# Patient Record
Sex: Male | Born: 1945 | Race: White | Hispanic: No | Marital: Married | Smoking: Former smoker
Health system: Southern US, Community
[De-identification: ages and names within clinical notes are randomized; demographics above are authoritative.]

## PROBLEM LIST (undated history)

## (undated) DIAGNOSIS — I2510 Atherosclerotic heart disease of native coronary artery without angina pectoris: Secondary | ICD-10-CM

## (undated) DIAGNOSIS — K219 Gastro-esophageal reflux disease without esophagitis: Secondary | ICD-10-CM

## (undated) DIAGNOSIS — Z9861 Coronary angioplasty status: Secondary | ICD-10-CM

## (undated) DIAGNOSIS — I219 Acute myocardial infarction, unspecified: Secondary | ICD-10-CM

## (undated) DIAGNOSIS — E041 Nontoxic single thyroid nodule: Secondary | ICD-10-CM

## (undated) DIAGNOSIS — I639 Cerebral infarction, unspecified: Secondary | ICD-10-CM

## (undated) DIAGNOSIS — E785 Hyperlipidemia, unspecified: Secondary | ICD-10-CM

## (undated) DIAGNOSIS — I209 Angina pectoris, unspecified: Secondary | ICD-10-CM

## (undated) DIAGNOSIS — I10 Essential (primary) hypertension: Secondary | ICD-10-CM

## (undated) DIAGNOSIS — C4442 Squamous cell carcinoma of skin of scalp and neck: Secondary | ICD-10-CM

## (undated) DIAGNOSIS — E039 Hypothyroidism, unspecified: Secondary | ICD-10-CM

## (undated) HISTORY — PX: CORONARY ANGIOPLASTY WITH STENT PLACEMENT: SHX49

## (undated) HISTORY — PX: WISDOM TOOTH EXTRACTION: SHX21

## (undated) HISTORY — DX: Atherosclerotic heart disease of native coronary artery without angina pectoris: I25.10

## (undated) HISTORY — PX: SQUAMOUS CELL CARCINOMA EXCISION: SHX2433

## (undated) HISTORY — DX: Cerebral infarction, unspecified: I63.9

## (undated) HISTORY — DX: Atherosclerotic heart disease of native coronary artery without angina pectoris: Z98.61

---

## 2000-06-06 DIAGNOSIS — I219 Acute myocardial infarction, unspecified: Secondary | ICD-10-CM

## 2000-06-06 HISTORY — DX: Acute myocardial infarction, unspecified: I21.9

## 2001-03-19 ENCOUNTER — Encounter: Payer: Self-pay | Admitting: Emergency Medicine

## 2001-03-19 ENCOUNTER — Inpatient Hospital Stay (HOSPITAL_COMMUNITY): Admission: EM | Admit: 2001-03-19 | Discharge: 2001-03-21 | Payer: Self-pay | Admitting: Emergency Medicine

## 2008-08-04 ENCOUNTER — Encounter (HOSPITAL_COMMUNITY): Admission: RE | Admit: 2008-08-04 | Discharge: 2008-10-07 | Payer: Self-pay | Admitting: Endocrinology

## 2008-08-11 ENCOUNTER — Ambulatory Visit (HOSPITAL_COMMUNITY): Admission: RE | Admit: 2008-08-11 | Discharge: 2008-08-11 | Payer: Self-pay | Admitting: Endocrinology

## 2010-10-22 NOTE — H&P (Signed)
[place]. [facility]  Patient:    Dennis [name], Dennis [name] Visit Number: [id number] MRN: [id number]          Service Type: MED Location: [phone] Attending Physician:  Dennis [name] Dictated by:   Dennis [name], M.D. Admit Date:  03/19/2001   CC:         Dennis [name], M.D.   History and Physical  CHIEF COMPLAINT:  Chest pain.  HISTORY OF PRESENT ILLNESS:  This 65 year old male presents to the emergency room with a chief complaint of chest pain.  Today is Monday.  He says that on Friday night, he was awaken about 2 a.m. with anterior chest discomfort.  He describes this as rather light and indicates it with his fingertips.  He says there was no radiation, but the discomfort lasted for about an hour, although he took Tums.  There was associated diaphoresis and nausea.  On Saturday and Sunday, he was up and around walking, but not engaged in any strenuous activity and had no symptoms.  He had a similar episode about 6 a.m. this morning.  PAST MEDICAL HISTORY:  Unremarkable.  He has never been hospitalized and he has never been treated for any ongoing problems.  He does smoke about one half pack of cigarettes a day.  FAMILY HISTORY:  His mother is 73 and is institutionalized with Alzheimers disease.  She has emphysema.  She quit smoking about 15 years ago.  His father died at 19 of head and neck cancer.  One brother has a hiatal hernia and has had hepatitis and is otherwise well.  Both of his sisters are well, except that they both have hiatal hernias.  SOCIAL HISTORY:  He works as a Social research officer, government for businesses.  He has been married for some years.  He has no children.  He lives at home with his wife.  REVIEW OF SYSTEMS:  Constitutional:  He denies fevers, chills, or sweats, except as noted above.  He has had no claudication or edema.  Eyes:  No diplopia or blurring.  ENT:  No deafness or dizziness.  Cardiovascular:  See the history of present  illness.  Respiratory:  His wife says that he occasionally wheezes.  GI:  He has some heartburn.  GU:  No dysuria or pyuria. Musculoskeletal:  No swollen joints or myalgias.  Skin:  No rash or nodule. Neurologic:  No faintness or syncope.  Psychiatric:  No depression or hallucinations.  Endocrine:  No known diabetes or thyroid disease. Hematology/Lymphatics:  No swelling in the neck, axillae, or groin.  Allergic: No known drug allergies.  All of the remaining systems in the comprehensive 14-system review is negative.  PHYSICAL EXAMINATION:  VITAL SIGNS:  Blood pressure 100/60, pulse 84 and regular, respirations 18 and unlabored, temperature 97.1 degrees.  GENERAL APPEARANCE:  He is a well-developed, well-nourished, middle-aged man in no acute distress.  He is oriented to person, place, and time.  His mood and affect are appropriate.  HEENT:  His conjunctivae and lids reveal no xanthelasma, icterus, or arcus senilis.  He has many of his own teeth, which are in fair repair.  The oral mucosa reveals no pallor or cyanosis.  NECK:  Supple and symmetrical.  The trachea is midline and mobile.  There is no palpable thyromegaly or cervical nodes.  No carotid bruit or JVD.  LUNGS:  His respiratory effort is normal at rest.  His lungs are clear to auscultation and percussion, except for possibly  slightly prolonged forced expiratory time.  BACK:  Straight and nontender.  There was no kyphosis, scoliosis, or punch tenderness.  NEUROLOGIC:  His gait is not tested.  He looks as though he would be able to do an adequate stress test.  Muscle strength and tone are normal.  HEART:  The cardiac apical impulse is cryptic.  The left border of cardiac dullness is within the left anterior axillary line.  The heart rhythm is regular and rate is normal.  The sounds are quite distant.  There is no gallop, click, or murmur.  His digits and nails revealed no clubbing or cyanosis.  SKIN:  The skin and  subcutaneous tissues reveal no stasis dermatitis or ulcer.  ABDOMEN:  Flat and nontender.  Bowel sounds are present.  His liver and spleen are not palpably enlarged.  The abdominal aorta is not palpable and there is no bruit.  EXTREMITIES:  The femoral arteries are without bruits.  The pedal pulses are intact dorsalis pedis and posterior tibial bilaterally.  The legs reveal no edema.  LABORATORY DATA:  His initial EKG is within normal limits, except for minor nonspecific ST segment flattening.  The chest x-ray is normal.  His first enzymes have just now returned and they are normal with a troponin I of 1.6.  At this point, Dennis [name] has almost a 100% chance of having significant coronary obstruction.  He was referred for cardiac catheterization today. Dictated by:   Dennis [name], M.D. Attending Physician:  Dennis [name] DD:  03/19/01 TD:  03/19/01 Job: [id number]

## 2010-10-22 NOTE — Cardiovascular Report (Signed)
[place]. [facility]  Patient:    Dennis [name], Dennis [name] Visit Number: [id number] MRN: [id number]          Service Type: MED Location: [phone] Attending Physician:  Clovis Cao Dictated by:   Aram Candela. Aleen Campi, M.D. Proc. Date: 03/19/01 Admit Date:  03/19/2001   CC:         Jaclyn Prime. Lucas Mallow, M.D.  Cardiac Catheterization Lab   Cardiac Catheterization  REFERRING PHYSICIAN:  Jaclyn Prime. Lucas Mallow, M.D.  PROCEDURE DONE BY:  Aram Candela. Aleen Campi, M.D.  PROCEDURES: 1. Emergency left heart catheterization. 2. Coronary cineangiography. 3. Left ventricular cineangiography. 4. Emergency percutaneous transluminal coronary angioplasty and stent    placement in the proximal right coronary artery. 5. Perclose of the right femoral artery and right femoral vein.  INDICATION FOR PROCEDURES:  This 65 year old male presented to the emergency room this morning after severe anterior chest pain and his electrocardiogram showed ST elevation in his inferior leads.  His cardiac enzymes were also abnormal.  His chest pain was relieved in the emergency room just prior to transport to the cardiac catheterization lab.  He has a history of hypertension.  DESCRIPTION OF PROCEDURE:  After signing an informed consent, the patient was premedicated with 50 mg of Benadryl and transported from the emergency room at [facility] to the cardiac catheterization lab.  His right groin was prepped and draped in a sterile fashion and anesthetized locally with 1% lidocaine.  A 6-French introducer sheath was inserted percutaneously in the right femoral artery.  The 6-French #4 Judkins coronary catheters were used to make injections into the coronary arteries.  A 6-French pigtail catheter was used to measure pressures in the left ventricle and aorta and to make a midstream injection into the left ventricle.  After noting an almost total occlusion of the proximal right coronary artery  with 99% stenosis and slow antegrade flow of TIMI-2, we discussed this finding with the patient in addition to the finding of a severe stenosis of 80% in his mid circumflex coronary artery.  We also noted a large clot involved with the proximal right coronary artery lesion and anticipated possible need for Angiojet during the procedure.  After discussing these alternatives, we elected to proceed with emergency angioplasty and exchanged the 6-French in the right femoral artery for a 7-French sheath.  We then inserted a 6-French introducer sheath percutaneously into the right femoral vein because of the possibility of needing a temporary pacemaker wire if the Angiojet procedure was to be done. We then inserted a 7-French JR4 guide catheter through the right femoral artery sheath and advanced it to the root of the aorta.  After inserting a long Hi-Torque Floppy guide wire into the guide catheter, the tip of the catheter was engaged in the ostium of the right coronary artery and the guide wire was then very carefully advanced into the proximal right coronary artery. After very careful advancement through the lesion, the guide wire was then advanced into the distal segment of the right coronary artery.  Further injection into the right coronary artery showed total occlusion of the artery with the clot still in place and evidence that the right coronary lesion was occluded with just the guide wire in place.  We then requested and obtained rapidly a 2.5- x 20-mm balloon catheter which was advanced over the guide wire and positioned within the lesion.  Two inflations were made, the first at 10 atmospheres for 28 seconds, and the  second at 10 atmospheres for 23 seconds. After these two inflations within the lesion, the CrossSail balloon catheter was removed and injections again into the right coronary artery showed opening of the artery with the clot showing compression and reestablishment of  TIMI-3 antegrade flow.  The patient experienced mild anterior chest pressure while the artery was closed with the guide wire.  With evidence of good compression and stability of the clot, we elected not to proceed with an Angiojet procedure and selected a stent deployment system using an Express 2 stent, size 3.0- x 24-mm, and after proper preparation, this stent deployment system was advanced over the guide wire and positioned within the lesion.  The stent was then deployed with one inflation at 14 atmospheres for 49 seconds.  After the stent was deployed, the deployment balloon was removed and final injections in the right coronary artery showed an excellent angiographic result with 0% residual lesion and no evidence for dissection or clot.  There was excellent runoff distally without any evidence for distal embolization. The patient tolerated the procedure well and no complications were noted.  At the end of the procedure, the catheter and sheath were removed from the right femoral artery and hemostasis was easily obtained with a Perclose closure system.  The right femoral vein sheath was then also removed and hemostasis was obtained with the Perclose closure system.  MEDICATIONS GIVEN:  Versed 1 mg IV, heparin 7000 units IV, Integrilin drip per pharmacy protocol.  HEMODYNAMIC DATA:  Left ventricular pressure 108/0-12.  Aortic pressure 108/69 with a mean of 89.  Of note is that his systolic pressure on arrival to the catheterization lab was 80 mmHg and after angioplasty, his systolic pressure rose to 148 mmHg.  Left ventricular ejection fraction was between 40-50%.  CINE FINDINGS:  Coronary cineangiography: 1. Left coronary artery:  The ostium and left main appear normal. 2. Left anterior descending:  The LAD has a mild segmental plaque in its    middle segment which is mildly calcified and causes a segmental 20-30%    stenosis.  There is normal antegrade flow and the distal LAD  appears     normal.  A diagonal and its septal branches appear normal and have    normal flow. 3. Circumflex coronary artery:  The circumflex coronary artery has a severe    stenosis in its middle segment.  There is a focal 80% stenosis which    causes mild decrease in antegrade flow.  When compared to antegrade flow    in the LAD, there did not appear to be a significant difference.  Distal    to this 80% stenosis in the large obtuse marginal branch, there is    another 70% focal concentric lesion.  We did not feel that this was    contributing to his clinical chest pain at this time. 4. Right coronary artery:  The right coronary artery was essentially    totally occluded on the first injection with very slow TIMI-1 to TIMI-2    antegrade flow.  There was an initial 70-80% focal concentric stenosis    followed by a 99% stenosis followed by a clot filling the lumen and    proximal 1 cm in length.  This was all in the proximal segment.  There    was mild narrowing throughout the proximal segment to the right    ventricular branch.  Following the right ventricular branch in the right    coronary artery then appears normal  with normal appearance of the distal    right coronary system and posterior descending and posterolateral branches    to the left ventricular.  Left ventricular cineangiogram:  The left ventricular chamber size appears normal.  The overall left ventricular contractility is mildly decreased with mild to moderate inferior hypokinesia.  The anterior and apical segments are normal.  The mitral and aortic valves appear normal.  Angioplasty cines:  Cines taken during the angioplasty procedure shows proper positioning of the guide catheter and guide wire with no flow seen and immediately after advancing the guide wire through the lesion, further cines showed proper positioning of the balloon catheter with a good balloon form obtained.  Injections following the PTCA showed  reestablishment of good antegrade flow and evidence for a raised plaque in the proximal segment just proximal to the clot.  The clot was still in situ and showed evidence of significant compression without embolization.  There was no distal hang-up of dye.  Further cines showed proper positioning of the stent deployment system and final injections after this stent deployment showed an excellent angiographic result with 0% residual lesion and normal antegrade flow, TIMI-3, and no evidence for dissection or clot.  There was very normal distal flow without evidence of distal embolization.  FINAL DIAGNOSES: 1. Two-vessel coronary artery disease. 2. Essentially totally occluded proximal right coronary artery with slow    antegrade flow and evidence for acute inferior myocardial infarction. 3. Severe stenosis, mid circumflex coronary artery. 4. Mild stenosis, mid left anterior descending coronary artery. 5. Mild left ventricular dysfunction with ejection fraction of 40-50% and    moderate inferior hypokinesia. 6. Successful angioplasty with percutaneous transluminal coronary angioplasty    and stent placement in the proximal right coronary artery lesion. 7. Successful Perclose of the right femoral artery and right femoral vein.  DISPOSITION:  The patient will be admitted to the CCU for further monitoring and care by Dr. Aggie Cosier.   Dictated by:   Aram Candela. Aleen Campi, M.D.  Attending Physician:  Clovis Cao DD:  03/19/01 TD:  03/19/01 Job: [id number]

## 2011-01-17 ENCOUNTER — Emergency Department (HOSPITAL_COMMUNITY): Payer: 59

## 2011-01-17 ENCOUNTER — Emergency Department (HOSPITAL_COMMUNITY)
Admission: EM | Admit: 2011-01-17 | Discharge: 2011-01-18 | Disposition: A | Discharge status: Home / Self Care | Payer: 59 | Attending: Emergency Medicine | Admitting: Emergency Medicine

## 2011-01-17 DIAGNOSIS — R5381 Other malaise: Secondary | ICD-10-CM | POA: Insufficient documentation

## 2011-01-17 DIAGNOSIS — R0989 Other specified symptoms and signs involving the circulatory and respiratory systems: Secondary | ICD-10-CM | POA: Insufficient documentation

## 2011-01-17 DIAGNOSIS — R5383 Other fatigue: Secondary | ICD-10-CM | POA: Insufficient documentation

## 2011-01-17 DIAGNOSIS — I2510 Atherosclerotic heart disease of native coronary artery without angina pectoris: Secondary | ICD-10-CM | POA: Insufficient documentation

## 2011-01-17 DIAGNOSIS — E7800 Pure hypercholesterolemia, unspecified: Secondary | ICD-10-CM | POA: Insufficient documentation

## 2011-01-17 DIAGNOSIS — R0609 Other forms of dyspnea: Secondary | ICD-10-CM | POA: Insufficient documentation

## 2011-01-17 DIAGNOSIS — R110 Nausea: Secondary | ICD-10-CM | POA: Insufficient documentation

## 2011-01-17 DIAGNOSIS — R079 Chest pain, unspecified: Secondary | ICD-10-CM | POA: Insufficient documentation

## 2011-01-17 DIAGNOSIS — I10 Essential (primary) hypertension: Secondary | ICD-10-CM | POA: Insufficient documentation

## 2011-01-17 DIAGNOSIS — Z7982 Long term (current) use of aspirin: Secondary | ICD-10-CM | POA: Insufficient documentation

## 2011-01-17 DIAGNOSIS — Z79899 Other long term (current) drug therapy: Secondary | ICD-10-CM | POA: Insufficient documentation

## 2011-01-17 LAB — BASIC METABOLIC PANEL
BUN: 18 mg/dL (ref 6–23)
CO2: 25 mEq/L (ref 19–32)
Calcium: 9.9 mg/dL (ref 8.4–10.5)
Chloride: 102 mEq/L (ref 96–112)
Creatinine, Ser: 1.02 mg/dL (ref 0.50–1.35)
GFR calc Af Amer: >60 mL/min (ref >60)
GFR calc non Af Amer: >60 mL/min (ref >60)
Glucose, Bld: 126 mg/dL — ABNORMAL HIGH (ref 70–99)
Potassium: 3.9 mEq/L (ref 3.5–5.1)
Sodium: 137 mEq/L (ref 135–145)

## 2011-01-17 LAB — CBC
HCT: 41.7 % (ref 39.0–52.0)
Hemoglobin: 14.1 g/dL (ref 13.0–17.0)
MCH: 29.7 pg (ref 26.0–34.0)
MCHC: 33.8 g/dL (ref 30.0–36.0)
MCV: 87.8 fL (ref 78.0–100.0)
Platelets: 205 10*3/uL (ref 150–400)
RBC: 4.75 MIL/uL (ref 4.22–5.81)
RDW: 13.5 % (ref 11.5–15.5)
WBC: 9.3 10*3/uL (ref 4.0–10.5)

## 2011-01-17 LAB — DIFFERENTIAL
Basophils Absolute: 0 10*3/uL (ref 0.0–0.1)
Basophils Relative: 0 % (ref 0–1)
Eosinophils Absolute: 0.2 10*3/uL (ref 0.0–0.7)
Eosinophils Relative: 2 % (ref 0–5)
Lymphocytes Relative: 19 % (ref 12–46)
Lymphs Abs: 1.8 10*3/uL (ref 0.7–4.0)
Monocytes Absolute: 1.1 10*3/uL — ABNORMAL HIGH (ref 0.1–1.0)
Monocytes Relative: 12 % (ref 3–12)
Neutro Abs: 6.1 10*3/uL (ref 1.7–7.7)
Neutrophils Relative %: 66 % (ref 43–77)

## 2011-01-17 LAB — POCT I-STAT TROPONIN I: Troponin i, poc: 0 ng/mL (ref 0.00–0.08)

## 2011-01-18 LAB — POCT I-STAT TROPONIN I: Troponin i, poc: 0 ng/mL (ref 0.00–0.08)

## 2014-09-05 HISTORY — PX: CARDIAC CATHETERIZATION: SHX172

## 2014-09-30 ENCOUNTER — Encounter (HOSPITAL_COMMUNITY): Payer: Self-pay | Admitting: *Deleted

## 2014-09-30 ENCOUNTER — Emergency Department (HOSPITAL_COMMUNITY): Payer: Medicare HMO

## 2014-09-30 ENCOUNTER — Inpatient Hospital Stay (HOSPITAL_COMMUNITY)
Admission: EM | Admit: 2014-09-30 | Discharge: 2014-10-03 | DRG: 247 | Disposition: A | Discharge status: Home / Self Care | Payer: Medicare HMO | Attending: Cardiovascular Disease | Admitting: Cardiovascular Disease

## 2014-09-30 ENCOUNTER — Encounter (HOSPITAL_COMMUNITY)
Admission: EM | Disposition: A | Discharge status: Home / Self Care | Payer: Self-pay | Source: Home / Self Care | Attending: Cardiovascular Disease

## 2014-09-30 DIAGNOSIS — I10 Essential (primary) hypertension: Secondary | ICD-10-CM | POA: Diagnosis present

## 2014-09-30 DIAGNOSIS — Z87891 Personal history of nicotine dependence: Secondary | ICD-10-CM | POA: Diagnosis not present

## 2014-09-30 DIAGNOSIS — I25110 Atherosclerotic heart disease of native coronary artery with unstable angina pectoris: Principal | ICD-10-CM | POA: Diagnosis present

## 2014-09-30 DIAGNOSIS — E785 Hyperlipidemia, unspecified: Secondary | ICD-10-CM | POA: Diagnosis present

## 2014-09-30 DIAGNOSIS — Z955 Presence of coronary angioplasty implant and graft: Secondary | ICD-10-CM

## 2014-09-30 DIAGNOSIS — I2584 Coronary atherosclerosis due to calcified coronary lesion: Secondary | ICD-10-CM | POA: Diagnosis present

## 2014-09-30 DIAGNOSIS — Z7902 Long term (current) use of antithrombotics/antiplatelets: Secondary | ICD-10-CM | POA: Diagnosis not present

## 2014-09-30 DIAGNOSIS — I2541 Coronary artery aneurysm: Secondary | ICD-10-CM | POA: Diagnosis present

## 2014-09-30 DIAGNOSIS — E041 Nontoxic single thyroid nodule: Secondary | ICD-10-CM

## 2014-09-30 DIAGNOSIS — R0789 Other chest pain: Secondary | ICD-10-CM

## 2014-09-30 DIAGNOSIS — I2510 Atherosclerotic heart disease of native coronary artery without angina pectoris: Secondary | ICD-10-CM | POA: Diagnosis not present

## 2014-09-30 DIAGNOSIS — R079 Chest pain, unspecified: Secondary | ICD-10-CM | POA: Insufficient documentation

## 2014-09-30 DIAGNOSIS — E039 Hypothyroidism, unspecified: Secondary | ICD-10-CM | POA: Diagnosis present

## 2014-09-30 DIAGNOSIS — Z9861 Coronary angioplasty status: Secondary | ICD-10-CM

## 2014-09-30 DIAGNOSIS — I200 Unstable angina: Secondary | ICD-10-CM | POA: Diagnosis present

## 2014-09-30 DIAGNOSIS — E038 Other specified hypothyroidism: Secondary | ICD-10-CM | POA: Diagnosis not present

## 2014-09-30 DIAGNOSIS — I4510 Unspecified right bundle-branch block: Secondary | ICD-10-CM | POA: Diagnosis present

## 2014-09-30 DIAGNOSIS — Z79899 Other long term (current) drug therapy: Secondary | ICD-10-CM

## 2014-09-30 DIAGNOSIS — Z7982 Long term (current) use of aspirin: Secondary | ICD-10-CM

## 2014-09-30 HISTORY — DX: Hypothyroidism, unspecified: E03.9

## 2014-09-30 HISTORY — DX: Angina pectoris, unspecified: I20.9

## 2014-09-30 HISTORY — DX: Acute myocardial infarction, unspecified: I21.9

## 2014-09-30 HISTORY — PX: LEFT HEART CATHETERIZATION WITH CORONARY ANGIOGRAM: SHX5451

## 2014-09-30 HISTORY — DX: Unstable angina: I20.0

## 2014-09-30 HISTORY — DX: Atherosclerotic heart disease of native coronary artery without angina pectoris: I25.10

## 2014-09-30 HISTORY — DX: Hyperlipidemia, unspecified: E78.5

## 2014-09-30 HISTORY — DX: Nontoxic single thyroid nodule: E04.1

## 2014-09-30 HISTORY — DX: Essential (primary) hypertension: I10

## 2014-09-30 HISTORY — DX: Gastro-esophageal reflux disease without esophagitis: K21.9

## 2014-09-30 LAB — BASIC METABOLIC PANEL
Anion gap: 10 (ref 5–15)
BUN: 13 mg/dL (ref 6–23)
CO2: 23 mmol/L (ref 19–32)
Calcium: 9.7 mg/dL (ref 8.4–10.5)
Chloride: 106 mmol/L (ref 96–112)
Creatinine, Ser: 1.16 mg/dL (ref 0.50–1.35)
GFR calc Af Amer: 72 mL/min — ABNORMAL LOW (ref >90)
GFR calc non Af Amer: 62 mL/min — ABNORMAL LOW (ref >90)
Glucose, Bld: 110 mg/dL — ABNORMAL HIGH (ref 70–99)
Potassium: 4 mmol/L (ref 3.5–5.1)
Sodium: 139 mmol/L (ref 135–145)

## 2014-09-30 LAB — PROTIME-INR
INR: 1.18 (ref 0.00–1.49)
Prothrombin Time: 15.1 seconds (ref 11.6–15.2)

## 2014-09-30 LAB — CBC
HCT: 44 % (ref 39.0–52.0)
Hemoglobin: 14.7 g/dL (ref 13.0–17.0)
MCH: 29.7 pg (ref 26.0–34.0)
MCHC: 33.4 g/dL (ref 30.0–36.0)
MCV: 88.9 fL (ref 78.0–100.0)
Platelets: 223 10*3/uL (ref 150–400)
RBC: 4.95 MIL/uL (ref 4.22–5.81)
RDW: 13.4 % (ref 11.5–15.5)
WBC: 6.9 10*3/uL (ref 4.0–10.5)

## 2014-09-30 LAB — I-STAT TROPONIN, ED: Troponin i, poc: 0 ng/mL (ref 0.00–0.08)

## 2014-09-30 LAB — TROPONIN I: Troponin I: <0.03 ng/mL (ref <0.031)

## 2014-09-30 SURGERY — LEFT HEART CATHETERIZATION WITH CORONARY ANGIOGRAM
Anesthesia: LOCAL

## 2014-09-30 MED ORDER — ASPIRIN 81 MG PO CHEW
324.0000 mg | CHEWABLE_TABLET | Freq: Once | ORAL | Status: AC
  Administered 2014-09-30: 324 mg via ORAL
  Filled 2014-09-30: qty 4

## 2014-09-30 MED ORDER — HEPARIN (PORCINE) IN NACL 100-0.45 UNIT/ML-% IJ SOLN
1500.0000 [IU]/h | INTRAMUSCULAR | Status: DC
  Administered 2014-09-30: 1500 [IU]/h via INTRAVENOUS
  Filled 2014-09-30: qty 250

## 2014-09-30 MED ORDER — FENTANYL CITRATE (PF) 100 MCG/2ML IJ SOLN
INTRAMUSCULAR | Status: AC
  Filled 2014-09-30: qty 2

## 2014-09-30 MED ORDER — ACETAMINOPHEN 325 MG PO TABS: 650.0000 mg | ORAL_TABLET | ORAL | Status: DC | PRN

## 2014-09-30 MED ORDER — VITAMIN C 500 MG PO TABS
1000.0000 mg | ORAL_TABLET | Freq: Every day | ORAL | Status: DC
  Administered 2014-10-01 – 2014-10-03 (×2): 1000 mg via ORAL
  Filled 2014-09-30 (×2): qty 2

## 2014-09-30 MED ORDER — ASPIRIN EC 81 MG PO TBEC
81.0000 mg | DELAYED_RELEASE_TABLET | Freq: Every day | ORAL | Status: DC
  Administered 2014-10-01: 81 mg via ORAL
  Filled 2014-09-30: qty 1

## 2014-09-30 MED ORDER — HEPARIN (PORCINE) IN NACL 2-0.9 UNIT/ML-% IJ SOLN
INTRAMUSCULAR | Status: AC
  Filled 2014-09-30: qty 1000

## 2014-09-30 MED ORDER — LIDOCAINE HCL (PF) 1 % IJ SOLN
INTRAMUSCULAR | Status: AC
  Filled 2014-09-30: qty 30

## 2014-09-30 MED ORDER — RAMIPRIL 10 MG PO CAPS
10.0000 mg | ORAL_CAPSULE | Freq: Every day | ORAL | Status: DC
  Administered 2014-10-01 – 2014-10-02 (×2): 10 mg via ORAL
  Filled 2014-09-30: qty 1
  Filled 2014-09-30: qty 2
  Filled 2014-09-30: qty 1

## 2014-09-30 MED ORDER — NITROGLYCERIN 1 MG/10 ML FOR IR/CATH LAB
INTRA_ARTERIAL | Status: AC
  Filled 2014-09-30: qty 10

## 2014-09-30 MED ORDER — VERAPAMIL HCL 2.5 MG/ML IV SOLN
INTRAVENOUS | Status: AC
  Filled 2014-09-30: qty 2

## 2014-09-30 MED ORDER — HEPARIN BOLUS VIA INFUSION
4000.0000 [IU] | Freq: Once | INTRAVENOUS | Status: AC
  Administered 2014-09-30: 4000 [IU] via INTRAVENOUS
  Filled 2014-09-30: qty 4000

## 2014-09-30 MED ORDER — MIDAZOLAM HCL 2 MG/2ML IJ SOLN
INTRAMUSCULAR | Status: AC
  Filled 2014-09-30: qty 2

## 2014-09-30 MED ORDER — VITAMIN D3 25 MCG (1000 UNIT) PO TABS
1000.0000 [IU] | ORAL_TABLET | Freq: Every day | ORAL | Status: DC
  Administered 2014-10-01 – 2014-10-03 (×3): 1000 [IU] via ORAL
  Filled 2014-09-30 (×3): qty 1

## 2014-09-30 MED ORDER — SODIUM CHLORIDE 0.9 % IV SOLN
INTRAVENOUS | Status: DC
  Administered 2014-09-30: 20:00:00 via INTRAVENOUS

## 2014-09-30 MED ORDER — LEVOTHYROXINE SODIUM 112 MCG PO TABS
112.0000 ug | ORAL_TABLET | Freq: Every day | ORAL | Status: DC
  Administered 2014-10-01 – 2014-10-02 (×2): 112 ug via ORAL
  Filled 2014-09-30 (×4): qty 1

## 2014-09-30 MED ORDER — HEPARIN (PORCINE) IN NACL 100-0.45 UNIT/ML-% IJ SOLN
1500.0000 [IU]/h | INTRAMUSCULAR | Status: DC
  Administered 2014-10-01 (×2): 1500 [IU]/h via INTRAVENOUS
  Filled 2014-09-30 (×2): qty 250

## 2014-09-30 MED ORDER — ASPIRIN 81 MG PO CHEW: 81.0000 mg | CHEWABLE_TABLET | Freq: Every day | ORAL | Status: DC

## 2014-09-30 MED ORDER — ROSUVASTATIN CALCIUM 20 MG PO TABS
20.0000 mg | ORAL_TABLET | Freq: Every day | ORAL | Status: DC
  Administered 2014-09-30 – 2014-10-01 (×2): 20 mg via ORAL
  Filled 2014-09-30: qty 1
  Filled 2014-09-30: qty 2
  Filled 2014-09-30: qty 1
  Filled 2014-09-30: qty 2

## 2014-09-30 MED ORDER — SODIUM CHLORIDE 0.9 % IV SOLN
INTRAVENOUS | Status: DC
  Administered 2014-09-30: 17:00:00 via INTRAVENOUS

## 2014-09-30 MED ORDER — FENOFIBRATE 160 MG PO TABS
160.0000 mg | ORAL_TABLET | Freq: Every day | ORAL | Status: DC
Start: 2014-10-01 — End: 2014-10-03
  Administered 2014-10-01 – 2014-10-03 (×3): 160 mg via ORAL
  Filled 2014-09-30 (×3): qty 1

## 2014-09-30 MED ORDER — HEPARIN SODIUM (PORCINE) 1000 UNIT/ML IJ SOLN
INTRAMUSCULAR | Status: AC
  Filled 2014-09-30: qty 1

## 2014-09-30 MED ORDER — ONDANSETRON HCL 4 MG/2ML IJ SOLN: 4.0000 mg | Freq: Four times a day (QID) | INTRAMUSCULAR | Status: DC | PRN

## 2014-09-30 MED ORDER — METOPROLOL SUCCINATE ER 100 MG PO TB24
100.0000 mg | ORAL_TABLET | Freq: Every day | ORAL | Status: DC
  Administered 2014-10-01 – 2014-10-03 (×3): 100 mg via ORAL
  Filled 2014-09-30 (×3): qty 1

## 2014-09-30 MED ORDER — NITROGLYCERIN 0.4 MG SL SUBL
0.4000 mg | SUBLINGUAL_TABLET | SUBLINGUAL | Status: DC | PRN
  Administered 2014-09-30: 0.4 mg via SUBLINGUAL
  Filled 2014-09-30: qty 1

## 2014-09-30 NOTE — ED Notes (Signed)
Pt placed in gown and in bed. Pt monitored by pulse ox, bp cuff, and 12-lead. 

## 2014-09-30 NOTE — ED Provider Notes (Signed)
CSN: [id number]     Arrival date & time 09/30/14  47 History   First MD Initiated Contact with Patient 09/30/14 1108     Chief Complaint  Patient presents with  . Chest Pain     (Consider location/radiation/quality/duration/timing/severity/associated sxs/prior Treatment) HPI  This is a 69 year old male who presents emergency Department with chief complaint of chest pressure. He has a history of known coronary artery disease, status post post cardiac catheterization in 2002. At that time showed 80% stenosis of the circumflex coronary artery and a 99% stenosis of the right coronary artery which was stented. He was also found to have an ejection fraction of 40-50% with mild left ventricular wall hypokinesis. He does not have a current cardiologist and is followed by his primary care doctor. The patient also has a history of smoking but quit 15 years ago, hypertension and hypercholesterolemia. He is compliant with medications. Over the past 5 days he has had chest pressure which is worse with exertion, relieved with rest. He states it feels like "an elephant" on his chest and has some radiation into the right arm. He denies chest pain, shortness of breath, nausea, or diaphoresis. He states his symptoms are similar to his previous event in 2002.  Past Medical History  Diagnosis Date  . Hypertension   . CAD (coronary artery disease)     a. 2002 Stent RCA & circumflex EF40-50%   . Hyperlipidemia   . Thyroid nodule, hot     s/p radioactive iodine, now hypothyroidism  . Hypothyroidism    Past Surgical History  Procedure Laterality Date  . Coronary stent placement     No family history on file. History  Substance Use Topics  . Smoking status: Former Research scientist (life sciences)  . Smokeless tobacco: Not on file  . Alcohol Use: Not on file    Review of Systems  Ten systems reviewed and are negative for acute change, except as noted in the HPI.    Allergies  Review of patient's allergies indicates no active  allergies.  Home Medications   Prior to Admission medications   Medication Sig Start Date End Date Taking? Authorizing Provider  ascorbic acid (VITAMIN C) 1000 MG tablet Take 1,000 mg by mouth daily.   Yes Historical Provider, MD  aspirin 81 MG tablet Take 81 mg by mouth daily.   Yes Historical Provider, MD  b complex vitamins tablet Take 1 tablet by mouth daily.   Yes Historical Provider, MD  cholecalciferol (VITAMIN D) 1000 UNITS tablet Take 1,000 Units by mouth daily.   Yes Historical Provider, MD  CRESTOR 40 MG tablet Take 20 mg by mouth daily.  07/08/14  Yes Historical Provider, MD  fenofibrate 160 MG tablet Take 160 mg by mouth daily. 07/28/14  Yes Historical Provider, MD  levothyroxine (SYNTHROID, LEVOTHROID) 112 MCG tablet Take 112 mcg by mouth daily. 09/19/14  Yes Historical Provider, MD  metoprolol succinate (TOPROL-XL) 100 MG 24 hr tablet Take 100 mg by mouth daily. 07/18/14  Yes Historical Provider, MD  ramipril (ALTACE) 10 MG capsule Take 10 mg by mouth daily. 08/20/14  Yes Historical Provider, MD   BP 175/95 mmHg  Pulse 65  Temp(Src) 97.5 F (36.4 C) (Oral)  Resp 15  Ht 5\' 8"  (1.727 m)  Wt 218 lb 6.4 oz (99.066 kg)  BMI 33.22 kg/m2  SpO2 94% Physical Exam  Constitutional: He appears well-developed and well-nourished. No distress.  HENT:  Head: Normocephalic and atraumatic.  Eyes: Conjunctivae are normal. No scleral icterus.  Neck: Normal range of motion. Neck supple.  Cardiovascular: Normal rate, regular rhythm and normal heart sounds.   Pulmonary/Chest: Effort normal and breath sounds normal. No respiratory distress.  Abdominal: Soft. There is no tenderness.  Musculoskeletal: He exhibits no edema.  Neurological: He is alert.  Skin: Skin is warm and dry. He is not diaphoretic.  Psychiatric: His behavior is normal.  Nursing note and vitals reviewed.   ED Course  Procedures (including critical care time) Labs Review Labs Reviewed  BASIC METABOLIC PANEL - Abnormal;  Notable for the following:    Glucose, Bld 110 (*)    GFR calc non Af Amer 62 (*)    GFR calc Af Amer 72 (*)    All other components within normal limits  CBC  I-STAT TROPOININ, ED    Imaging Review Dg Chest Port 1 View  09/30/2014   CLINICAL DATA:  Chest pressure for 5 days. Diaphoresis today. Indigestion.  EXAM: PORTABLE CHEST - 1 VIEW  COMPARISON:  01/17/2011  FINDINGS: The heart size and mediastinal contours are within normal limits. Both lungs are clear. The visualized skeletal structures are unremarkable.  IMPRESSION: No active disease.   Electronically Signed   By: Rolm Baptise M.D.   On: 09/30/2014 11:39     EKG Interpretation   Date/Time:  Tuesday September 30 2014 10:44:29 EDT Ventricular Rate:  70 PR Interval:  160 QRS Duration: 138 QT Interval:  426 QTC Calculation: 460 R Axis:   -50 Text Interpretation:  Normal sinus rhythm Left axis deviation Right bundle  branch block Moderate voltage criteria for LVH, may be normal variant  Abnormal ECG Confirmed by Hazle Coca [phone]) on 09/30/2014 10:59:54 AM      MDM   Final diagnoses:  Chest tightness or pressure    2:42 PM BP 175/95 mmHg  Pulse 65  Temp(Src) 97.5 F (36.4 C) (Oral)  Resp 15  Ht 5\' 8"  (1.727 m)  Wt 218 lb 6.4 oz (99.066 kg)  BMI 33.22 kg/m2  SpO2 94% Patient sxs concerning for ACS. Known CAD> His troponini is negative. EKG is unconcerning for acute ischemia. BMP pending.  I have spoken with the Cardiology PA.  They will consult on the patient. He is CP free at this time.  Patient admitted by cardiology. He will receive a cath today. Made NPO. Stable throughout ED visit without return of sxs.   Margarita Mail, PA-C 09/30/14 [place], PA-C 09/30/14 1443  Quintella Reichert, MD 09/30/14 [phone]

## 2014-09-30 NOTE — Progress Notes (Addendum)
ANTICOAGULATION CONSULT NOTE - Initial Consult  Pharmacy Consult for Heparin Indication: chest pain/ACS  No Active Allergies  Patient Measurements: Height: 5\' 8"  (172.7 cm) Weight: 215 lb 1.6 oz (97.569 kg) IBW/kg (Calculated) : 68.4 Heparin Dosing Weight: 91 kg  Vital Signs: Temp: 98 F (36.7 C) (04/26 1509) Temp Source: Oral (04/26 1509) BP: 162/77 mmHg (04/26 1509) Pulse Rate: 63 (04/26 1509)  Labs:  Recent Labs  09/30/14 1103  HGB 14.7  HCT 44.0  PLT 223  CREATININE 1.16    Estimated Creatinine Clearance: 68.1 mL/min (by C-G formula based on Cr of 1.16).   Medical History: Past Medical History  Diagnosis Date  . Hypertension   . CAD (coronary artery disease)     a. 2002 Stent RCA & circumflex EF40-50%   . Hyperlipidemia   . Thyroid nodule, hot     s/p radioactive iodine, now hypothyroidism  . Hypothyroidism     Assessment: 69 year old male to begin heparin for chest pressure in preparation for cath  Goal of Therapy:  Heparin level 0.3-0.7 units/ml Monitor platelets by anticoagulation protocol: Yes   Plan:  Heparin 4000 units iv bolus x 1 Heparin drip at 1500 units / hr Heparin level 6 hours after heparin starts -- 2230 pm Daily heparin level, CBC  ? Cath today  Thank you. Anette Guarneri, PharmD [phone]  09/30/2014,3:39 PM  2000 PM Now s/p cath -- to resume heparin 10 hours post sheath removal  Will resume at 1500 units / hr at 5 am with 8 hour heparin level  Thank you. Anette Guarneri, PharmD

## 2014-09-30 NOTE — CV Procedure (Signed)
Dennis [name] is a 69 y.o. male   [id number]  [id number] LOCATION:  FACILITY: [place]  PHYSICIAN: Troy Sine, MD, [facility] [birth year]   DATE OF PROCEDURE:  09/30/2014     CARDIAC CATHETERIZATION    HISTORY:  Dennis [name] is a 69 year old gentleman who has a history of hypertension, hyperlipidemia, remote tobacco use, and who has undergone prior stenting of a subtotal RCA stenosis in 2002. At that time he also had 80% circumflex stenosis.  He had done well on medical therapy but recently developed significant social chest pressure with diaphoresis, herbal days ago and today headache episode of marked diaphoresis for which he presented for evaluation.  He was seen by Dr. Gwenlyn Found and due to worrisome symptomatology for unstable angina he is referred for cardiac catheterization.   PROCEDURE:  Left heart catheterization via the radial approach: Coronary angiography, left ventriculography.  The patient was brought to the second floor [facility] cath lab in the postabsorptive state. The patient was premedicated with Versed 2 mg and fentanyl 50 mcg. A right radial approach was utilized after an Allen's test verified adequate circulation. The right radial artery was punctured via the Seldinger technique, and a 6 [place] Glidesheath Slender was inserted without difficulty.  A radial cocktail consisting of Verapamil, IV nitroglycerin, and lidocaine was administered. Weight adjusted heparin was administered. A safety J wire was advanced into the ascending aorta. Diagnostic catheterization was done with a 5 [place] TIG 4.0 catheter. A 5 French pigtail catheter was used for left ventriculography. A TR radial band was applied for hemostasis. The patient left the catheterization laboratory in stable condition.   HEMODYNAMICS:   Central Aorta: 120/72  Left Ventricle: 120/15  ANGIOGRAPHY:   Fluoroscopy revealed mild coronary calcification.  The left main coronary artery was  angiographically normal and bifurcated into the LAD and left circumflex coronary artery.   The LAD was angiographically normal and gave rise to one major proximal diagonal vessel and several septal perforating arteries. The vessel extended to the LV apex.   The left circumflex coronary artery was large caliber vessel with proximal calcification.  There was proximal 50% stenosis and then a 90% stenosis on an acute band.  After this 90% stenosis the artery was aneurysmal and there was 20-30% narrowing immediately after this aneurysmal segment.  A distal branch of the distal circumflex marginal was 99% stenosed.  The RCA was a moderate size dominant gave rise to a  PDA and PLA vessel.  A stent was placed in the proximal RCA.  There was a area of significant aneurysmal dilatation, which appeared to initiate proximal to the stented segment slightly beyond the ostium.    Left ventriculography revealed normal global LV contractility with an ejection fraction of 55%.  There was a small region of inferior basal hypocontractility.  Total contrast used: 80 cc Omnipaque  IMPRESSION:  Preserved LV function with an ejection fraction of 55% and evidence for small focal region of mild posterobasal hypocontractility.  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  RECOMMENDATION:  Angiograms will be  reviewed with her interventional and surgical colleagues prior to final  recommendation.  If lesions are to be treated percutaneously, covered stenting would be necessary due to the aneurysm and ectasia.     Troy Sine, MD, [facility] 09/30/2014 7:34 PM

## 2014-09-30 NOTE — H&P (Signed)
Patient ID: Dennis [name] MRN: [id number], DOB/AGE: [birth year]   Admit date: 09/30/2014   Primary Physician: [name], MD Primary Cardiologist: New (Dr. Gwenlyn Found)  Pt. Profile:  Dennis [name] is a 69 y.o. male with a history of CAD, former smoker, HTN, HL who presented to [facility] 4/26 For evaluation of chest pressure.   HPI:  Cath 2002 - 80% stenosis of the circumflex coronary artery and a 99% stenosis of the right coronary artery which was stented. He was also found to have an ejection fraction of 40-50% with mild left ventricular wall hypokinesis. He does not have a current cardiologist and is followed by his primary care doctor. He was in his usual state of health up until 5 days ago when he had exertional chest pressure with diaphoresis, relieved with rest in few minutes. It felt like "an elephant" on his chest and radiated to his right arm. His symptoms were similar to his previous event in 2002. He had similar episode 2 days ago that was resolved with rest in few minutes. This AM he woke with a diaphoresis, no chest pressure and decided to come to ED for further evaluation. He denied any SOB or nausea. He is compliant with medications. He quit smoking 15 years ago.   IN ED, Lytes normal, creatinine 1.16, POC trop negative, CXR without active disease. EKG - NSR with RBBB, no change from prior. He has received ASA 324 and SL nitro in ED.   Problem List  Past Medical History  Diagnosis Date  . Hypertension   . CAD (coronary artery disease)     a. 2002 Stent RCA & circumflex EF40-50%   . Hyperlipidemia   . Thyroid nodule, hot     s/p radioactive iodine, now hypothyroidism  . Hypothyroidism     Past Surgical History  Procedure Laterality Date  . Coronary stent placement       Allergies  No Active Allergies   Home Medications  Prior to Admission medications   Medication Sig Start Date End Date Taking? Authorizing Provider  ascorbic acid (VITAMIN C) 1000  MG tablet Take 1,000 mg by mouth daily.   Yes Historical Provider, MD  aspirin 81 MG tablet Take 81 mg by mouth daily.   Yes Historical Provider, MD  b complex vitamins tablet Take 1 tablet by mouth daily.   Yes Historical Provider, MD  cholecalciferol (VITAMIN D) 1000 UNITS tablet Take 1,000 Units by mouth daily.   Yes Historical Provider, MD  CRESTOR 40 MG tablet Take 20 mg by mouth daily.  07/08/14  Yes Historical Provider, MD  fenofibrate 160 MG tablet Take 160 mg by mouth daily. 07/28/14  Yes Historical Provider, MD  levothyroxine (SYNTHROID, LEVOTHROID) 112 MCG tablet Take 112 mcg by mouth daily. 09/19/14  Yes Historical Provider, MD  metoprolol succinate (TOPROL-XL) 100 MG 24 hr tablet Take 100 mg by mouth daily. 07/18/14  Yes Historical Provider, MD  ramipril (ALTACE) 10 MG capsule Take 10 mg by mouth daily. 08/20/14  Yes Historical Provider, MD    Social History  History   Social History  . Marital Status: Married    Spouse Name: N/A  . Number of Children: N/A  . Years of Education: N/A   Occupational History  . Not on file.   Social History Main Topics  . Smoking status: Former Research scientist (life sciences)  . Smokeless tobacco: Not on file  . Alcohol Use: Not on file  . Drug Use: Not on file  . Sexual  Activity: Not on file   Other Topics Concern  . Not on file   Social History Narrative  . No narrative on file     All other systems reviewed and are otherwise negative except as noted above.  Physical Exam  Blood pressure 173/97, pulse 67, temperature 97.5 F (36.4 C), temperature source Oral, resp. rate 15, height 5\' 8"  (1.727 m), weight 218 lb 6.4 oz (99.066 kg), SpO2 95 %.  General: Pleasant, NAD Psych: Normal affect. Neuro: Alert and oriented X 3. Moves all extremities spontaneously. HEENT: Normal  Neck: Supple without bruits or JVD. Lungs:  Resp regular and unlabored, CTA. Heart: RRR no s3, s4, or murmurs. Abdomen: Soft, non-tender, non-distended, BS + x 4.  Extremities: No  clubbing, cyanosis or edema. DP/PT/Radials 2+ and equal bilaterally.  Labs  Lab Results  Component Value Date   WBC 6.9 09/30/2014   HGB 14.7 09/30/2014   HCT 44.0 09/30/2014   MCV 88.9 09/30/2014   PLT 223 09/30/2014     Recent Labs Lab 09/30/14 1103  NA 139  K 4.0  CL 106  CO2 23  BUN 13  CREATININE 1.16  CALCIUM 9.7  GLUCOSE 110*     Radiology/Studies  Dg Chest Port 1 View  09/30/2014   CLINICAL DATA:  Chest pressure for 5 days. Diaphoresis today. Indigestion.  EXAM: PORTABLE CHEST - 1 VIEW  COMPARISON:  01/17/2011  FINDINGS: The heart size and mediastinal contours are within normal limits. Both lungs are clear. The visualized skeletal structures are unremarkable.  IMPRESSION: No active disease.   Electronically Signed   By: Rolm Baptise M.D.   On: 09/30/2014 11:39   Cath 2002 Coronary cineangiography: 1. Left coronary artery: The ostium and left main appear normal. 2. Left anterior descending: The LAD has a mild segmental plaque in its  middle segment which is mildly calcified and causes a segmental 20-30%  stenosis. There is normal antegrade flow and the distal LAD appears normal. A diagonal and its septal branches appear    normal and have normal flow. 3. Circumflex coronary artery: The circumflex coronary artery has a severe  stenosis in its middle segment. There is a focal 80% stenosis which  causes mild decrease in antegrade flow. When compared to antegrade flow  in the LAD, there did not appear to be a significant difference. Distal  to this 80% stenosis in the large obtuse marginal branch, there is  another 70% focal concentric lesion. We did not feel that this was  contributing to his clinical chest pain at this time. 4. Right coronary artery: The right coronary artery was essentially  totally occluded on the first injection with very slow TIMI-1 to TIMI-2  antegrade flow. There was an initial 70-80% focal concentric  stenosis  followed by a 99% stenosis followed by a clot filling the lumen and  proximal 1 cm in length. This was all in the proximal segment. There  was mild narrowing throughout the proximal segment to the right  ventricular branch. Following the right ventricular branch in the right  coronary artery then appears normal with normal appearance of the distal  right coronary system and posterior descending and posterolateral branches  to the left ventricular.  Left ventricles: Mild left ventricular dysfunction with ejection fraction of 40-50% and  moderate inferior hypokinesia.  ECG NSR with RBBB, no change from prior EKG.  ASSESSMENT AND PLAN Principal Problem:   Unstable angina Active Problems:   Hypothyroidism   Hyperlipidemia   CAD (  coronary artery disease)   1. Unstable angina - Pt symptoms concerning for acute disease. He was in his USOH up until 5 days ago, now symptomatic with rest. Creatinine 1.16, POC trop negative. EKG without acute abnormality. Cyclic trop. We will processed with cath later today. NPO. Hydrate.  -He has received ASA 324 and SL nitro in ED. Continue Toprol XL and ramipril. Start heparin per pharmacy.  2. Hyperlipidemia - Pending lipid panel. Continue Crestor 20 and fenofibrate 160mg . May need to change to a higher dose s/p cath.  3. Hypothyroidism -Continue levothyroxine. Hx of hot thyroid nodule s/p radioactive iodine.   Signed, Leanor Kail, PA-C 09/30/2014, 1:56 PM Pager [phone]  Agree with A & P by B Bhagat PAC  Pt with known CAD s/p PCI/Stent total RCA 2002 by Dr. Glade Lloyd. Additionally he had 80% LCX OM disease with Inf WMA and EF 40-45%. He has had several days of SSCPressure similar to his Pre intervention Sx assoc with diaphoresis. Currently asymptomatic. Exam benign. Labs OK. EKG with RBBB (no change). Enz neg. After discussion with pt I've decided to proceed with cath today to define his anatomy.  Lorretta Harp, M.D., [place], [facility], [place] Grandfather [address] [place], [place]  [id number]  [phone] 09/30/2014 3:24 PM

## 2014-09-30 NOTE — ED Notes (Signed)
Pt reports chest tightness for 3 days. Pt reports diaphoresis and indigestion but denies other associated symptoms. Pt has hx of stent placement.

## 2014-09-30 NOTE — ED Notes (Signed)
X-ray at bedside

## 2014-09-30 NOTE — Interval H&P Note (Signed)
Cath Lab Visit (complete for each Cath Lab visit)  Clinical Evaluation Leading to the Procedure:   ACS: No.  Non-ACS:    Anginal Classification: CCS III  Anti-ischemic medical therapy: Maximal Therapy (2 or more classes of medications)  Non-Invasive Test Results: No non-invasive testing performed  Prior CABG: No previous CABG      History and Physical Interval Note:  09/30/2014 6:25 PM  Dennis [name]  has presented today for surgery, with the diagnosis of recurrent cp  The various methods of treatment have been discussed with the patient and family. After consideration of risks, benefits and other options for treatment, the patient has consented to  Procedure(s): LEFT HEART CATHETERIZATION WITH CORONARY ANGIOGRAM (N/A) as a surgical intervention .  The patient's history has been reviewed, patient examined, no change in status, stable for surgery.  I have reviewed the patient's chart and labs.  Questions were answered to the patient's satisfaction.     Demarus Latterell A

## 2014-10-01 ENCOUNTER — Encounter (HOSPITAL_COMMUNITY): Payer: Self-pay | Admitting: Cardiovascular Disease

## 2014-10-01 DIAGNOSIS — I2510 Atherosclerotic heart disease of native coronary artery without angina pectoris: Secondary | ICD-10-CM

## 2014-10-01 DIAGNOSIS — E038 Other specified hypothyroidism: Secondary | ICD-10-CM

## 2014-10-01 DIAGNOSIS — E785 Hyperlipidemia, unspecified: Secondary | ICD-10-CM

## 2014-10-01 DIAGNOSIS — I200 Unstable angina: Secondary | ICD-10-CM

## 2014-10-01 LAB — BASIC METABOLIC PANEL
Anion gap: 7 (ref 5–15)
BUN: 14 mg/dL (ref 6–23)
CO2: 24 mmol/L (ref 19–32)
Calcium: 9.1 mg/dL (ref 8.4–10.5)
Chloride: 107 mmol/L (ref 96–112)
Creatinine, Ser: 1.25 mg/dL (ref 0.50–1.35)
GFR calc Af Amer: 66 mL/min — ABNORMAL LOW (ref >90)
GFR calc non Af Amer: 57 mL/min — ABNORMAL LOW (ref >90)
Glucose, Bld: 111 mg/dL — ABNORMAL HIGH (ref 70–99)
Potassium: 4.2 mmol/L (ref 3.5–5.1)
Sodium: 138 mmol/L (ref 135–145)

## 2014-10-01 LAB — LIPID PANEL
Cholesterol: 100 mg/dL (ref 0–200)
HDL: 21 mg/dL — ABNORMAL LOW (ref >39)
LDL Cholesterol: 11 mg/dL (ref 0–99)
Total CHOL/HDL Ratio: 4.8 RATIO
Triglycerides: 338 mg/dL — ABNORMAL HIGH (ref <150)
VLDL: 68 mg/dL — ABNORMAL HIGH (ref 0–40)

## 2014-10-01 LAB — TROPONIN I
Troponin I: <0.03 ng/mL (ref <0.031)
Troponin I: <0.03 ng/mL (ref <0.031)

## 2014-10-01 LAB — HEPARIN LEVEL (UNFRACTIONATED): Heparin Unfractionated: 0.37 IU/mL (ref 0.30–0.70)

## 2014-10-01 MED ORDER — TICAGRELOR 90 MG PO TABS
180.0000 mg | ORAL_TABLET | Freq: Once | ORAL | Status: AC
  Administered 2014-10-01: 180 mg via ORAL
  Filled 2014-10-01: qty 2

## 2014-10-01 MED ORDER — ASPIRIN 81 MG PO CHEW
81.0000 mg | CHEWABLE_TABLET | ORAL | Status: AC
  Administered 2014-10-02: 81 mg via ORAL
  Filled 2014-10-01: qty 1

## 2014-10-01 MED ORDER — ALUM & MAG HYDROXIDE-SIMETH 200-200-20 MG/5ML PO SUSP
15.0000 mL | Freq: Four times a day (QID) | ORAL | Status: DC | PRN
  Filled 2014-10-01: qty 30

## 2014-10-01 MED ORDER — SODIUM CHLORIDE 0.9 % IJ SOLN: 3.0000 mL | INTRAMUSCULAR | Status: DC | PRN

## 2014-10-01 MED ORDER — ASPIRIN 81 MG PO CHEW
81.0000 mg | CHEWABLE_TABLET | ORAL | Status: AC
  Administered 2014-10-01: 81 mg via ORAL
  Filled 2014-10-01: qty 1

## 2014-10-01 MED ORDER — ASPIRIN EC 81 MG PO TBEC
81.0000 mg | DELAYED_RELEASE_TABLET | Freq: Every day | ORAL | Status: DC
Start: 2014-10-03 — End: 2014-10-03

## 2014-10-01 MED ORDER — SODIUM CHLORIDE 0.9 % IJ SOLN: 3.0000 mL | Freq: Two times a day (BID) | INTRAMUSCULAR | Status: DC

## 2014-10-01 MED ORDER — SODIUM CHLORIDE 0.9 % IV SOLN: 250.0000 mL | INTRAVENOUS | Status: DC | PRN

## 2014-10-01 MED ORDER — ALUM & MAG HYDROXIDE-SIMETH 200-200-20 MG/5ML PO SUSP
30.0000 mL | Freq: Four times a day (QID) | ORAL | Status: DC | PRN
Start: 2014-10-01 — End: 2014-10-01
  Administered 2014-10-01: 30 mL via ORAL

## 2014-10-01 MED ORDER — SODIUM CHLORIDE 0.9 % IV SOLN
1.0000 mL/kg/h | INTRAVENOUS | Status: DC
  Administered 2014-10-01 (×2): 1 mL/kg/h via INTRAVENOUS

## 2014-10-01 MED ORDER — ALUM & MAG HYDROXIDE-SIMETH 200-200-20 MG/5ML PO SUSP
30.0000 mL | Freq: Four times a day (QID) | ORAL | Status: DC | PRN
  Administered 2014-10-01: 30 mL via ORAL
  Filled 2014-10-01: qty 30

## 2014-10-01 MED ORDER — SODIUM CHLORIDE 0.9 % IV SOLN
1.0000 mL/kg/h | INTRAVENOUS | Status: DC
  Administered 2014-10-02: 1 mL/kg/h via INTRAVENOUS

## 2014-10-01 NOTE — Progress Notes (Addendum)
Patient Name: Dennis [name] Date of Encounter: 10/01/2014   SUBJECTIVE  Doing well post cath. Denies CP or SOB.   CURRENT MEDS . aspirin EC  81 mg Oral Daily  . cholecalciferol  1,000 Units Oral Daily  . fenofibrate  160 mg Oral Daily  . levothyroxine  112 mcg Oral QAC breakfast  . metoprolol succinate  100 mg Oral Daily  . ramipril  10 mg Oral Daily  . rosuvastatin  20 mg Oral Daily  . ascorbic acid  1,000 mg Oral Daily    OBJECTIVE  Filed Vitals:   09/30/14 2245 09/30/14 2300 10/01/14 0510 10/01/14 0731  BP: 115/67 113/67 141/84 158/89  Pulse: 58 59 66 65  Temp:   98.4 F (36.9 C) 98.1 F (36.7 C)  TempSrc:   Oral Oral  Resp:    15  Height:      Weight:   217 lb 4.8 oz (98.567 kg)   SpO2: 91% 92% 95% 95%    Intake/Output Summary (Last 24 hours) at 10/01/14 0834 Last data filed at 10/01/14 0542  Gross per 24 hour  Intake   1165 ml  Output    700 ml  Net    465 ml   Filed Weights   09/30/14 1052 09/30/14 1511 10/01/14 0510  Weight: 218 lb 6.4 oz (99.066 kg) 215 lb 1.6 oz (97.569 kg) 217 lb 4.8 oz (98.567 kg)    PHYSICAL EXAM  General: Pleasant, NAD. Neuro: Alert and oriented X 3. Moves all extremities spontaneously. Psych: Normal affect. HEENT:  Normal  Neck: Supple without bruits or JVD. Lungs:  Resp regular and unlabored, CTA. Heart: RRR no s3, s4, or murmurs. Abdomen: Soft, non-tender, non-distended, BS + x 4.  Extremities: No clubbing, cyanosis or edema. DP/PT/Radials 2+ and equal bilaterally. R radial cath site without erythema, edema, bruise or bruits.   Accessory Clinical Findings  CBC  Recent Labs  09/30/14 1103  WBC 6.9  HGB 14.7  HCT 44.0  MCV 88.9  PLT 086   Basic Metabolic Panel  Recent Labs  09/30/14 1103 10/01/14 0303  NA 139 138  K 4.0 4.2  CL 106 107  CO2 23 24  GLUCOSE 110* 111*  BUN 13 14  CREATININE 1.16 1.25  CALCIUM 9.7 9.1   Cardiac Enzymes  Recent Labs  09/30/14 1710 09/30/14 2109  10/01/14 0303  TROPONINI <0.03 <0.03 <0.03   Fasting Lipid Panel  Recent Labs  10/01/14 0303  CHOL 100  HDL 21*  LDLCALC 11  TRIG 338*  CHOLHDL 4.8    TELE  NSR with frequent PVCs.  Cath 09/30/14 ANGIOGRAPHY:   Fluoroscopy revealed mild coronary calcification.  The left main coronary artery was angiographically normal and bifurcated into the LAD and left circumflex coronary artery.   The LAD was angiographically normal and gave rise to one major proximal diagonal vessel and several septal perforating arteries. The vessel extended to the LV apex.   The left circumflex coronary artery was large caliber vessel with proximal calcification. There was proximal 50% stenosis and then a 90% stenosis on an acute band. After this 90% stenosis the artery was aneurysmal and there was 20-30% narrowing immediately after this aneurysmal segment. A distal branch of the distal circumflex marginal was 99% stenosed.  The RCA was a moderate size dominant gave rise to a PDA and PLA vessel. A stent was placed in the proximal RCA. There was a area of significant aneurysmal dilatation, which appeared to initiate proximal to  the stented segment slightly beyond the ostium.   Left ventriculography revealed normal global LV contractility with an ejection fraction of 55%. There was a small region of inferior basal hypocontractility.  Total contrast used: 80 cc Omnipaque  IMPRESSION:  Preserved LV function with an ejection fraction of 55% and evidence for small focal region of mild posterobasal hypocontractility.  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  RECOMMENDATION:  Angiograms will bereviewed with her interventional and surgical colleagues prior to final recommendation. If lesions are to be treated percutaneously, covered stenting would be necessary due to the aneurysm and ectasia.    ASSESSMENT AND PLAN Principal Problem:   Unstable angina Active Problems:   Hypothyroidism   Hyperlipidemia   CAD (coronary artery disease)   Chest pain   Dennis [name] is a 69 y.o. male with a history of CAD, former smoker, HTN, HL who presented to [facility] 4/26 For evaluation of chest pressure.  CAD s/p PCI/Stent total RCA 2002 by Dr. Glade Lloyd. Additionally he had 80% LCX OM disease with Inf WMA and EF 40-45%.   1. Unstable angina - Pt symptoms was concerning for acute disease. He was in his USOH up until 5 days ago, now symptomatic with rest. 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 Angiograms will bereviewed with her interventional and surgical colleagues prior to final recommendation.  -Continue Toprol XL, ramipril, ASA, heparin, PRN nitro.  2. Hyperlipidemia - 10/01/2014: Cholesterol, Total 100; HDL-C 21*; LDL (calc) 11; Triglycerides 338*; VLDL 68*. Continue Crestor 20 and fenofibrate 160mg .   3. Hypothyroidism -Continue levothyroxine. Hx of hot thyroid nodule s/p radioactive iodine.   Dispo: Will discusses case with Dr. Claiborne Billings and interventional specialist.   Signed, Bhagat,Bhavinkumar PA-C Pager [phone]  Personally seen and examined. Agree with above. No chest pain. Prior pain when walking in cold  (burning across chest wall).  Complex CAD. Reviewed cath report.  Await further decision on treatment plan.  Discussed with wife as well.  Treating trigs with fenofibrate and crestor. On Hep IV.   Candee Furbish, MD  ADDEN:  Will load with Brilinta 180 (wrote order) Plan PCI with DES later today Discussed with Dr. Burt Knack who reviewed films with Dr. Carolan Shiver, MD

## 2014-10-01 NOTE — Progress Notes (Signed)
ANTICOAGULATION CONSULT NOTE Pharmacy Consult for Heparin Indication: chest pain/ACS  No Active Allergies  Patient Measurements: Height: 5\' 8"  (172.7 cm) Weight: 217 lb 4.8 oz (98.567 kg) IBW/kg (Calculated) : 68.4 Heparin Dosing Weight: 91 kg  Vital Signs: Temp: 98.1 F (36.7 C) (04/27 0731) Temp Source: Oral (04/27 0731) BP: 158/89 mmHg (04/27 0731) Pulse Rate: 65 (04/27 0731)  Labs:  Recent Labs  09/30/14 1103 09/30/14 1710 09/30/14 2109 10/01/14 0303 10/01/14 1242  HGB 14.7  --   --   --   --   HCT 44.0  --   --   --   --   PLT 223  --   --   --   --   LABPROT  --  15.1  --   --   --   INR  --  1.18  --   --   --   HEPARINUNFRC  --   --   --   --  0.37  CREATININE 1.16  --   --  1.25  --   TROPONINI  --  <0.03 <0.03 <0.03  --     Estimated Creatinine Clearance: 63.5 mL/min (by C-G formula based on Cr of 1.25).   Assessment: 69 year old male on heparin for chest pressure.  S/p cath 4/26.  Has 2V CAD and RCA aneurysm.  Considering a covered stent vs CABG.  HL drawn after heparin drip resumed 10 hours s/p sheath removal with no bolus at rate of 1500 units/hr is therapeutic at 0.37.   No bleeding reported.    Goal of Therapy:  Heparin level 0.3-0.7 units/ml Monitor platelets by anticoagulation protocol: Yes   Plan: continue heparin at 1500 units/hr Daily HL CBC   Eudelia Bunch, Pharm.D. [phone] 10/01/2014 2:19 PM

## 2014-10-01 NOTE — Progress Notes (Addendum)
    Cath has been canceled due to scheduling issues. Patient upset but understanding. He can eat. It has been rescheduled for 12 pm tomorrow with Dr Tamala Julian. NPO after midnight.   Angelena Form PA-C  [facility]

## 2014-10-02 ENCOUNTER — Encounter (HOSPITAL_COMMUNITY)
Admission: EM | Disposition: A | Discharge status: Home / Self Care | Payer: Self-pay | Source: Home / Self Care | Attending: Cardiovascular Disease

## 2014-10-02 ENCOUNTER — Encounter (HOSPITAL_COMMUNITY): Payer: Self-pay | Admitting: Cardiovascular Disease

## 2014-10-02 DIAGNOSIS — I25110 Atherosclerotic heart disease of native coronary artery with unstable angina pectoris: Secondary | ICD-10-CM

## 2014-10-02 HISTORY — PX: PERCUTANEOUS CORONARY STENT INTERVENTION (PCI-S): SHX5485

## 2014-10-02 LAB — POCT ACTIVATED CLOTTING TIME: Activated Clotting Time: 485 seconds

## 2014-10-02 LAB — CBC
HCT: 39.9 % (ref 39.0–52.0)
Hemoglobin: 13.3 g/dL (ref 13.0–17.0)
MCH: 29.7 pg (ref 26.0–34.0)
MCHC: 33.3 g/dL (ref 30.0–36.0)
MCV: 89.1 fL (ref 78.0–100.0)
Platelets: 182 10*3/uL (ref 150–400)
RBC: 4.48 MIL/uL (ref 4.22–5.81)
RDW: 13.7 % (ref 11.5–15.5)
WBC: 5.5 10*3/uL (ref 4.0–10.5)

## 2014-10-02 LAB — HEPARIN LEVEL (UNFRACTIONATED): Heparin Unfractionated: 0.48 IU/mL (ref 0.30–0.70)

## 2014-10-02 SURGERY — PERCUTANEOUS CORONARY STENT INTERVENTION (PCI-S)
Anesthesia: LOCAL

## 2014-10-02 MED ORDER — LIDOCAINE HCL (PF) 1 % IJ SOLN
INTRAMUSCULAR | Status: AC
  Filled 2014-10-02: qty 30

## 2014-10-02 MED ORDER — HEPARIN SODIUM (PORCINE) 1000 UNIT/ML IJ SOLN
INTRAMUSCULAR | Status: AC
  Filled 2014-10-02: qty 1

## 2014-10-02 MED ORDER — VERAPAMIL HCL 2.5 MG/ML IV SOLN
INTRAVENOUS | Status: AC
  Filled 2014-10-02: qty 2

## 2014-10-02 MED ORDER — FENTANYL CITRATE (PF) 100 MCG/2ML IJ SOLN
INTRAMUSCULAR | Status: AC
  Filled 2014-10-02: qty 2

## 2014-10-02 MED ORDER — MIDAZOLAM HCL 2 MG/2ML IJ SOLN
INTRAMUSCULAR | Status: AC
  Filled 2014-10-02: qty 2

## 2014-10-02 MED ORDER — TICAGRELOR 90 MG PO TABS
90.0000 mg | ORAL_TABLET | Freq: Two times a day (BID) | ORAL | Status: DC
  Administered 2014-10-02 – 2014-10-03 (×2): 90 mg via ORAL
  Filled 2014-10-02 (×3): qty 1

## 2014-10-02 MED ORDER — HEPARIN (PORCINE) IN NACL 2-0.9 UNIT/ML-% IJ SOLN
INTRAMUSCULAR | Status: AC
  Filled 2014-10-02: qty 1000

## 2014-10-02 MED ORDER — SODIUM CHLORIDE 0.9 % IV SOLN: INTRAVENOUS | Status: AC

## 2014-10-02 MED ORDER — NITROGLYCERIN 1 MG/10 ML FOR IR/CATH LAB
INTRA_ARTERIAL | Status: AC
  Filled 2014-10-02: qty 10

## 2014-10-02 MED ORDER — TICAGRELOR 90 MG PO TABS
ORAL_TABLET | ORAL | Status: AC
  Filled 2014-10-02: qty 1

## 2014-10-02 NOTE — Progress Notes (Signed)
Subjective: No further chest pressure  Objective: Vital signs in last 24 hours: Temp:  [97.9 F (36.6 C)-98.1 F (36.7 C)] 97.9 F (36.6 C) (04/28 0500) Pulse Rate:  [58-61] 61 (04/28 0500) Resp:  [16-18] 18 (04/28 0500) BP: (145-153)/(80) 145/80 mmHg (04/28 0500) SpO2:  [96 %-97 %] 96 % (04/28 0500) Weight:  [218 lb 3.2 oz (98.975 kg)] 218 lb 3.2 oz (98.975 kg) (04/28 0500) Last BM Date: 09/28/14  Intake/Output from previous day: 04/27 0701 - 04/28 0700 In: 1244.6 [P.O.:160; I.V.:1084.6] Out: 2000 [Urine:2000] Intake/Output this shift:    Medications Scheduled Meds: . [START ON 10/03/2014] aspirin EC  81 mg Oral Daily  . cholecalciferol  1,000 Units Oral Daily  . fenofibrate  160 mg Oral Daily  . levothyroxine  112 mcg Oral QAC breakfast  . metoprolol succinate  100 mg Oral Daily  . ramipril  10 mg Oral Daily  . rosuvastatin  20 mg Oral Daily  . sodium chloride  3 mL Intravenous Q12H  . sodium chloride  3 mL Intravenous Q12H  . ascorbic acid  1,000 mg Oral Daily   Continuous Infusions: . sodium chloride Stopped (10/01/14 1900)  . sodium chloride 1 mL/kg/hr (10/01/14 2043)  . sodium chloride 1 mL/kg/hr (10/02/14 5465)  . heparin 1,500 Units/hr (10/01/14 1751)   PRN Meds:.sodium chloride, sodium chloride, acetaminophen, alum & mag hydroxide-simeth, nitroGLYCERIN, ondansetron (ZOFRAN) IV, sodium chloride, sodium chloride  PE: General appearance: alert, cooperative and no distress Lungs: clear to auscultation bilaterally Heart: regular rate and rhythm, S1, S2 normal, no murmur, click, rub or gallop Extremities: No LEE Pulses: 2+ and symmetric Skin: Warm and dry Neurologic: Grossly normal  Lab Results:   Recent Labs  09/30/14 1103 10/02/14 0307  WBC 6.9 5.5  HGB 14.7 13.3  HCT 44.0 39.9  PLT 223 182   BMET  Recent Labs  09/30/14 1103 10/01/14 0303  NA 139 138  K 4.0 4.2  CL 106 107  CO2 23 24  GLUCOSE 110* 111*  BUN 13 14  CREATININE 1.16  1.25  CALCIUM 9.7 9.1   PT/INR  Recent Labs  09/30/14 1710  LABPROT 15.1  INR 1.18   Cholesterol  Recent Labs  10/01/14 0303  CHOL 100   Lipid Panel     Component Value Date/Time   CHOL 100 10/01/2014 0303   TRIG 338* 10/01/2014 0303   HDL 21* 10/01/2014 0303   CHOLHDL 4.8 10/01/2014 0303   VLDL 68* 10/01/2014 0303   LDLCALC 11 10/01/2014 0303    Cardiac Panel (last 3 results)  Recent Labs  09/30/14 1710 09/30/14 2109 10/01/14 0303  TROPONINI <0.03 <0.03 <0.03    Assessment/Plan   Principal Problem:   Unstable angina Active Problems:   Hypothyroidism   Hyperlipidemia   CAD (coronary artery disease)   Chest pain  1. Unstable angina - Pt symptoms are concerning for acute disease. He was in his USOH up until 5 days ago, now symptomatic with rest. Cath 09/30/14 showed LAD without obstructive disease, calcification of the proximal circumflex with 50% followed by 90% stenosis in an acute band of the proximal circumflex prior to a dilatedaneurysmal segment and subtotal distal marginal branch stenosis near the apex, and patent proximal RCA stent with a significant proximal RCA aneurysm which seems to be proximal to the stented segment. Angiograms will bereviewed with her interventional and surgical colleagues prior to final recommendation.  -Continue Toprol XL, ramipril, ASA, heparin, PRN nitro.  -PCI cancelled yesterday and rescheduled  for today at 0900hrs.   2. Hyperlipidemia - 10/01/2014: Cholesterol, Total 100; HDL-C 21*; LDL (calc) 11; Triglycerides 338*; VLDL 68*. Continue Crestor 20 and fenofibrate 160mg .   3. Hypothyroidism -Continue levothyroxine. Hx of hot thyroid nodule s/p radioactive iodine.    LOS: 2 days    HAGER, BRYAN PA-C 10/02/2014 7:43 AM  Personally seen and examined. Agree with above. Cardiac catheterization note reviewed, successful DES placement to circumflex artery. Plan for discharge tomorrow morning. I saw patient in the  hallway on his way to cardiac catheterization this morning. Wife was present.

## 2014-10-02 NOTE — Interval H&P Note (Signed)
History and Physical Interval Note:  10/02/2014 8:59 AM  Dennis [name]  has presented today for cardiac cath/PCI of the Circumflex with the diagnosis of CAD/unstable angina.  The various methods of treatment have been discussed with the patient and family. After consideration of risks, benefits and other options for treatment, the patient has consented to  Procedure(s): PERCUTANEOUS CORONARY STENT INTERVENTION (PCI-S) (N/A) as a surgical intervention .  The patient's history has been reviewed, patient examined, no change in status, stable for surgery.  I have reviewed the patient's chart and labs.  Questions were answered to the patient's satisfaction.    Cath Lab Visit (complete for each Cath Lab visit)  Clinical Evaluation Leading to the Procedure:   ACS: No.  Non-ACS:    Anginal Classification: CCS III  Anti-ischemic medical therapy: Minimal Therapy (1 class of medications)  Non-Invasive Test Results: No non-invasive testing performed  Prior CABG: No previous CABG         MCALHANY,CHRISTOPHER

## 2014-10-02 NOTE — H&P (View-Only) (Signed)
Patient Name: Dennis [name] Date of Encounter: 10/01/2014   SUBJECTIVE  Doing well post cath. Denies CP or SOB.   CURRENT MEDS . aspirin EC  81 mg Oral Daily  . cholecalciferol  1,000 Units Oral Daily  . fenofibrate  160 mg Oral Daily  . levothyroxine  112 mcg Oral QAC breakfast  . metoprolol succinate  100 mg Oral Daily  . ramipril  10 mg Oral Daily  . rosuvastatin  20 mg Oral Daily  . ascorbic acid  1,000 mg Oral Daily    OBJECTIVE  Filed Vitals:   09/30/14 2245 09/30/14 2300 10/01/14 0510 10/01/14 0731  BP: 115/67 113/67 141/84 158/89  Pulse: 58 59 66 65  Temp:   98.4 F (36.9 C) 98.1 F (36.7 C)  TempSrc:   Oral Oral  Resp:    15  Height:      Weight:   217 lb 4.8 oz (98.567 kg)   SpO2: 91% 92% 95% 95%    Intake/Output Summary (Last 24 hours) at 10/01/14 0834 Last data filed at 10/01/14 0542  Gross per 24 hour  Intake   1165 ml  Output    700 ml  Net    465 ml   Filed Weights   09/30/14 1052 09/30/14 1511 10/01/14 0510  Weight: 218 lb 6.4 oz (99.066 kg) 215 lb 1.6 oz (97.569 kg) 217 lb 4.8 oz (98.567 kg)    PHYSICAL EXAM  General: Pleasant, NAD. Neuro: Alert and oriented X 3. Moves all extremities spontaneously. Psych: Normal affect. HEENT:  Normal  Neck: Supple without bruits or JVD. Lungs:  Resp regular and unlabored, CTA. Heart: RRR no s3, s4, or murmurs. Abdomen: Soft, non-tender, non-distended, BS + x 4.  Extremities: No clubbing, cyanosis or edema. DP/PT/Radials 2+ and equal bilaterally. R radial cath site without erythema, edema, bruise or bruits.   Accessory Clinical Findings  CBC  Recent Labs  09/30/14 1103  WBC 6.9  HGB 14.7  HCT 44.0  MCV 88.9  PLT 235   Basic Metabolic Panel  Recent Labs  09/30/14 1103 10/01/14 0303  NA 139 138  K 4.0 4.2  CL 106 107  CO2 23 24  GLUCOSE 110* 111*  BUN 13 14  CREATININE 1.16 1.25  CALCIUM 9.7 9.1   Cardiac Enzymes  Recent Labs  09/30/14 1710 09/30/14 2109  10/01/14 0303  TROPONINI <0.03 <0.03 <0.03   Fasting Lipid Panel  Recent Labs  10/01/14 0303  CHOL 100  HDL 21*  LDLCALC 11  TRIG 338*  CHOLHDL 4.8    TELE  NSR with frequent PVCs.  Cath 09/30/14 ANGIOGRAPHY:   Fluoroscopy revealed mild coronary calcification.  The left main coronary artery was angiographically normal and bifurcated into the LAD and left circumflex coronary artery.   The LAD was angiographically normal and gave rise to one major proximal diagonal vessel and several septal perforating arteries. The vessel extended to the LV apex.   The left circumflex coronary artery was large caliber vessel with proximal calcification. There was proximal 50% stenosis and then a 90% stenosis on an acute band. After this 90% stenosis the artery was aneurysmal and there was 20-30% narrowing immediately after this aneurysmal segment. A distal branch of the distal circumflex marginal was 99% stenosed.  The RCA was a moderate size dominant gave rise to a PDA and PLA vessel. A stent was placed in the proximal RCA. There was a area of significant aneurysmal dilatation, which appeared to initiate proximal to  the stented segment slightly beyond the ostium.   Left ventriculography revealed normal global LV contractility with an ejection fraction of 55%. There was a small region of inferior basal hypocontractility.  Total contrast used: 80 cc Omnipaque  IMPRESSION:  Preserved LV function with an ejection fraction of 55% and evidence for small focal region of mild posterobasal hypocontractility.  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  RECOMMENDATION:  Angiograms will bereviewed with her interventional and surgical colleagues prior to final recommendation. If lesions are to be treated percutaneously, covered stenting would be necessary due to the aneurysm and ectasia.    ASSESSMENT AND PLAN Principal Problem:   Unstable angina Active Problems:   Hypothyroidism   Hyperlipidemia   CAD (coronary artery disease)   Chest pain   Dennis [name] is a 69 y.o. male with a history of CAD, former smoker, HTN, HL who presented to [facility] 4/26 For evaluation of chest pressure.  CAD s/p PCI/Stent total RCA 2002 by Dr. Glade Lloyd. Additionally he had 80% LCX OM disease with Inf WMA and EF 40-45%.   1. Unstable angina - Pt symptoms was concerning for acute disease. He was in his USOH up until 5 days ago, now symptomatic with rest. 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 Angiograms will bereviewed with her interventional and surgical colleagues prior to final recommendation.  -Continue Toprol XL, ramipril, ASA, heparin, PRN nitro.  2. Hyperlipidemia - 10/01/2014: Cholesterol, Total 100; HDL-C 21*; LDL (calc) 11; Triglycerides 338*; VLDL 68*. Continue Crestor 20 and fenofibrate 160mg .   3. Hypothyroidism -Continue levothyroxine. Hx of hot thyroid nodule s/p radioactive iodine.   Dispo: Will discusses case with Dr. Claiborne Billings and interventional specialist.   Signed, Bhagat,Bhavinkumar PA-C Pager [phone]  Personally seen and examined. Agree with above. No chest pain. Prior pain when walking in cold  (burning across chest wall).  Complex CAD. Reviewed cath report.  Await further decision on treatment plan.  Discussed with wife as well.  Treating trigs with fenofibrate and crestor. On Hep IV.   Candee Furbish, MD  ADDEN:  Will load with Brilinta 180 (wrote order) Plan PCI with DES later today Discussed with Dr. Burt Knack who reviewed films with Dr. Carolan Shiver, MD

## 2014-10-02 NOTE — CV Procedure (Signed)
      Cardiac Catheterization Operative Report  Dennis [name] [id number] 4/28/20169:25 AM [name], MD  Procedure Performed:  1. PTCA/DES proximal Circumflex  Operator: Lauree Chandler, MD  Arterial access site:  Right radial artery.   Indication: 69 yo male with history of HTN, HLD, CAD admitted with unstable angina. Diagnostic cath on 09/30/14 per Dr. Claiborne Billings and found to have severe stenosis in the mid Circumflex. After review with the interventional team, Dr. Claiborne Billings decided to proceed with PCI of the Circumflex but case delayed until today due to scheduling conflicts.                                       Procedure Details: The risks, benefits, complications, treatment options, and expected outcomes were discussed with the patient. The patient and/or family concurred with the proposed plan, giving informed consent. The patient was brought to the cath lab after IV hydration was begun and oral premedication was given. The patient was further sedated with Versed and Fentanyl. The right wrist was assessed with a modified Allens test which was positive. The right wrist was prepped and draped in a sterile fashion. 1% lidocaine was used for local anesthesia. Using the modified Seldinger access technique, a 5 French sheath was placed in the right radial artery. 3 mg Verapamil was given through the sheath. 10,000 units IV heparin was given. He was loaded with 180 mg Brilinta x 1 po. (loading dose given yesterday am but none given last night).  PCI note: I engaged the left main with a XB 3.0 guiding catheter. When the ACT was over 200, I advanced a Cougar IC wire down the Circumflex. A 2.5 x 15 mm balloon was used to pre-dilate the stenosis in the mid vessel. I then deployed a 4.0 x 20 mm Promus Premier DES in the mid Circumflex. The stent was post-dilated with a 4.5 x 15 mm Boaz balloon x 1. The stenosis was taken from 95% down to 0%. There was an excellent angiographic result with  TIMI-3 flow down the vessel. The guide and wire were removed.   The sheath was removed from the right radial artery and a Terumo hemostasis band was applied at the arteriotomy site on the right wrist. There were no immediate complications. The patient was taken to the recovery area in stable condition.   Hemodynamic Findings: Central aortic pressure: 120/70  Impression: 1. Unstable angina secondary to severe stenosis mid Circumflex 2. Successful PTCA/DES x 1 mid Circumflex  Recommendations: He will need lifelong dual anti-platelet therapy. Would use ASA and Brilinta for one year then transition to ASA and Plavix. Continue beta blocker and statin. Monitor overnight. Follow up with Dr. Gwenlyn Found.        Complications:  None. The patient tolerated the procedure well.

## 2014-10-02 NOTE — Progress Notes (Signed)
TR BAND REMOVAL  LOCATION:    right radial  DEFLATED PER PROTOCOL:    Yes.    TIME BAND OFF / DRESSING APPLIED:    1415   SITE UPON ARRIVAL:    Level 0  SITE AFTER BAND REMOVAL:    Level 0  REVERSE ALLEN'S TEST:     positive  CIRCULATION SENSATION AND MOVEMENT:    Within Normal Limits   Yes.    COMMENTS:   Tolerated procedure well  

## 2014-10-03 DIAGNOSIS — I4510 Unspecified right bundle-branch block: Secondary | ICD-10-CM | POA: Diagnosis present

## 2014-10-03 DIAGNOSIS — I10 Essential (primary) hypertension: Secondary | ICD-10-CM

## 2014-10-03 HISTORY — DX: Unspecified right bundle-branch block: I45.10

## 2014-10-03 HISTORY — DX: Essential (primary) hypertension: I10

## 2014-10-03 LAB — BASIC METABOLIC PANEL
Anion gap: 8 (ref 5–15)
BUN: 11 mg/dL (ref 6–23)
CO2: 22 mmol/L (ref 19–32)
Calcium: 9.1 mg/dL (ref 8.4–10.5)
Chloride: 109 mmol/L (ref 96–112)
Creatinine, Ser: 1.17 mg/dL (ref 0.50–1.35)
GFR calc Af Amer: 72 mL/min — ABNORMAL LOW (ref >90)
GFR calc non Af Amer: 62 mL/min — ABNORMAL LOW (ref >90)
Glucose, Bld: 112 mg/dL — ABNORMAL HIGH (ref 70–99)
Potassium: 3.8 mmol/L (ref 3.5–5.1)
Sodium: 139 mmol/L (ref 135–145)

## 2014-10-03 LAB — CBC
HCT: 40.9 % (ref 39.0–52.0)
Hemoglobin: 13.4 g/dL (ref 13.0–17.0)
MCH: 29.1 pg (ref 26.0–34.0)
MCHC: 32.8 g/dL (ref 30.0–36.0)
MCV: 88.7 fL (ref 78.0–100.0)
Platelets: 198 10*3/uL (ref 150–400)
RBC: 4.61 MIL/uL (ref 4.22–5.81)
RDW: 13.7 % (ref 11.5–15.5)
WBC: 6.5 10*3/uL (ref 4.0–10.5)

## 2014-10-03 MED ORDER — ROSUVASTATIN CALCIUM 10 MG PO TABS
10.0000 mg | ORAL_TABLET | Freq: Every day | ORAL | Status: DC
  Filled 2014-10-03: qty 1

## 2014-10-03 MED ORDER — ACETAMINOPHEN 325 MG PO TABS: 650.0000 mg | ORAL_TABLET | ORAL | Status: DC | PRN

## 2014-10-03 MED ORDER — TICAGRELOR 90 MG PO TABS: 90.0000 mg | ORAL_TABLET | Freq: Two times a day (BID) | ORAL | Status: DC

## 2014-10-03 MED ORDER — ROSUVASTATIN CALCIUM 20 MG PO TABS
20.0000 mg | ORAL_TABLET | Freq: Every day | ORAL | Status: DC
  Filled 2014-10-03: qty 1

## 2014-10-03 MED ORDER — NITROGLYCERIN 0.4 MG SL SUBL: 0.4000 mg | SUBLINGUAL_TABLET | SUBLINGUAL | Status: DC | PRN

## 2014-10-03 NOTE — Progress Notes (Signed)
CARDIAC REHAB PHASE I   PRE:  Rate/Rhythm: 71 SR  BP:  Supine:   Sitting: 130/78  Standing:    SaO2:   MODE:  Ambulation: 1000 ft   POST:  Rate/Rhythm: 80 SR  BP:  Supine:   Sitting: 144/80  Standing:    SaO2:  0855-0947 Pt walked 1000 ft with steady gait and no CP. Tolerated well. Education completed. Understanding voiced. Discussed importance of brilinta with stent. Has brilinta booklet. Discussed CRP 2 and pt gave permission to refer to [facility] program. Pt has been trying to make healthy diet choices, discussed this more.   Graylon Good, RN BSN  10/03/2014 9:43 AM

## 2014-10-03 NOTE — Discharge Summary (Signed)
Patient ID: Dennis [name],  MRN: [id number], DOB/AGE: 70-Feb-1947 70 y.o.  Admit date: 09/30/2014 Discharge date: 10/03/2014  Primary Care Provider: [name], MD Primary Cardiologist: Dr Gwenlyn Found  Discharge Diagnoses Principal Problem:   Unstable angina Active Problems:   CAD- CFX RCA PCI '02, DES CFX 10/02/14   Essential hypertension   Hypothyroidism   Hyperlipidemia   RBBB    Procedures: Cath 09/30/14                        PCI: CFX DES 10/02/14   Hospital Course:  69 yo male with history of prior RCA stent 2002,HTN, HLD followed by Dr Alyson Ingles, admitted 09/30/14 with unstable angina. Diagnostic cath on 09/30/14 per Dr. Claiborne Billings found severe stenosis in the mid Circumflex. After review with the interventional team, Dr. Claiborne Billings decided to proceed with PCI of the Circumflex but case delayed until 10/02/14 due to scheduling conflicts.On 10/02/14 the pt underwent PCI with DES to the mid CFX by Dr Angelena Form. He tolerated this well and is stable for discharge 10/03/14.       At discharge it was noted his lipid panel was markedly abnormal. His LDL was 11, HDL 21, total cholesterol 100. We feel this result may not be accurate. The pt says his lipids were checked a couple of months ago and his HDL was 30, LDL 50. He was to see Dr Terance Hart next week for a f/u lipid panel. We will leave him on his current Crestor dose and have him f/u with Dr Terance Hart about this.   Discharge Vitals:  Blood pressure 136/83, pulse 75, temperature 97.6 F (36.4 C), temperature source Oral, resp. rate 19, height 5\' 8"  (1.727 m), weight 217 lb 13 oz (98.8 kg), SpO2 94 %.    Labs: Results for orders placed or performed during the hospital encounter of 09/30/14 (from the past 24 hour(s))  POCT Activated clotting time     Status: None   Collection Time: 10/02/14  9:44 AM  Result Value Ref Range   Activated Clotting Time 485 seconds  Basic metabolic panel     Status: Abnormal   Collection Time: 10/03/14  2:57  AM  Result Value Ref Range   Sodium 139 135 - 145 mmol/L   Potassium 3.8 3.5 - 5.1 mmol/L   Chloride 109 96 - 112 mmol/L   CO2 22 19 - 32 mmol/L   Glucose, Bld 112 (H) 70 - 99 mg/dL   BUN 11 6 - 23 mg/dL   Creatinine, Ser 1.17 0.50 - 1.35 mg/dL   Calcium 9.1 8.4 - 10.5 mg/dL   GFR calc non Af Amer 62 (L) >90 mL/min   GFR calc Af Amer 72 (L) >90 mL/min   Anion gap 8 5 - 15  CBC     Status: None   Collection Time: 10/03/14  2:57 AM  Result Value Ref Range   WBC 6.5 4.0 - 10.5 K/uL   RBC 4.61 4.22 - 5.81 MIL/uL   Hemoglobin 13.4 13.0 - 17.0 g/dL   HCT 40.9 39.0 - 52.0 %   MCV 88.7 78.0 - 100.0 fL   MCH 29.1 26.0 - 34.0 pg   MCHC 32.8 30.0 - 36.0 g/dL   RDW 13.7 11.5 - 15.5 %   Platelets 198 150 - 400 K/uL    Disposition:      Follow-up Information    Follow up with Lorretta Harp, MD.   Specialty:  Cardiology   Why:  office will call you   Contact information:   [address] [place] [address] [phone]       Follow up with [name], MD.   Specialty:  Internal Medicine   Why:  call office today for follow up   Contact information:   [place], [place] [address] [phone]       Discharge Medications:    Medication List    TAKE these medications        acetaminophen 325 MG tablet  Commonly known as:  TYLENOL  Take 2 tablets (650 mg total) by mouth every 4 (four) hours as needed for headache or mild pain.     ascorbic acid 1000 MG tablet  Commonly known as:  VITAMIN C  Take 1,000 mg by mouth daily.     aspirin 81 MG tablet  Take 81 mg by mouth daily.     b complex vitamins tablet  Take 1 tablet by mouth daily.     cholecalciferol 1000 UNITS tablet  Commonly known as:  VITAMIN D  Take 1,000 Units by mouth daily.     CRESTOR 40 MG tablet  Generic drug:  rosuvastatin  Take 20 mg by mouth daily.     fenofibrate 160 MG tablet  Take 160 mg by mouth daily.     levothyroxine 112 MCG tablet    Commonly known as:  SYNTHROID, LEVOTHROID  Take 112 mcg by mouth daily.     metoprolol succinate 100 MG 24 hr tablet  Commonly known as:  TOPROL-XL  Take 100 mg by mouth daily.     nitroGLYCERIN 0.4 MG SL tablet  Commonly known as:  NITROSTAT  Place 1 tablet (0.4 mg total) under the tongue every 5 (five) minutes as needed for chest pain.     ramipril 10 MG capsule  Commonly known as:  ALTACE  Take 10 mg by mouth daily.     ticagrelor 90 MG Tabs tablet  Commonly known as:  BRILINTA  Take 1 tablet (90 mg total) by mouth 2 (two) times daily.         Duration of Discharge Encounter: Greater than 30 minutes including physician time.  Angelena Form PA-C 10/03/2014 8:33 AM

## 2014-10-03 NOTE — Discharge Instructions (Signed)
Coronary Angiogram With Stent, Care After °Refer to this sheet in the next few weeks. These instructions provide you with information on caring for yourself after your procedure. Your health care provider may also give you more specific instructions. Your treatment has been planned according to current medical practices, but problems sometimes occur. Call your health care provider if you have any problems or questions after your procedure.  °WHAT TO EXPECT AFTER THE PROCEDURE  °The insertion site may be tender for a few days after your procedure. °HOME CARE INSTRUCTIONS  °· Take medicines only as directed by your health care provider. Blood thinners may be prescribed after your procedure to improve blood flow through the stent. °· Change any bandages (dressings) as directed by your health care provider.   °· Check your insertion site every day for redness, swelling, or fluid leaking from the insertion.   °· Do not take baths, swim, or use a hot tub until your health care provider approves. You may shower. Pat the insertion area dry. Do not rub the insertion area with a washcloth or towel.   °· Eat a heart-healthy diet. This should include plenty of fresh fruits and vegetables. Meat should be lean cuts. Avoid the following types of food:   °¨ Food that is high in salt.   °¨ Canned or highly processed food.   °¨ Food that is high in saturated fat or sugar.   °¨ Fried food.   °· Make any other lifestyle changes recommended by your health care provider. This may include:   °¨ Not using any tobacco products including cigarettes, chewing tobacco, or electronic cigarettes.  °¨ Managing your weight.   °¨ Getting regular exercise.   °¨ Managing your blood pressure.   °¨ Limiting your alcohol intake.   °¨ Managing other health problems, such as diabetes.   °· If you need an MRI after your heart stent was placed, be sure to tell the health care provider who orders the MRI that you have a heart stent.   °· Keep all follow-up  visits as directed by your health care provider.   °SEEK IMMEDIATE MEDICAL CARE IF:  °· You develop chest pain, shortness of breath, feel faint, or pass out. °· You have bleeding, swelling larger than a walnut, or drainage from the catheter insertion site. °· You develop pain, discoloration, coldness, or severe bruising in the leg or arm that held the catheter. °· You develop bleeding from any other place such as from the bowels. There may be bright red blood in the urine or stools, or it may appear as black, tarry stools. °· You have a fever or chills. °MAKE SURE YOU: °· Understand these instructions. °· Will watch your condition. °· Will get help right away if you are not doing well or get worse. °Document Released: 12/10/2004 Document Revised: 10/07/2013 Document Reviewed: 10/24/2012 °Exit[employer]® Patient Information ©2015 [employer]. This information is not intended to replace advice given to you by your health care provider. Make sure you discuss any questions you have with your health care provider. ° °

## 2014-10-03 NOTE — Care Management Note (Unsigned)
    Page 1 of 1   10/03/2014     11:07:46 AM CARE MANAGEMENT NOTE 10/03/2014  Patient:  Dennis [name], Dennis [name]   Account Number:  [id number]  Date Initiated:  10/03/2014  Documentation initiated by:  Whitman Hero  Subjective/Objective Assessment:   From home admitted with unstable angina.     Action/Plan:   Return to home hen medically stable. CM to f/u with d/c needs.   Anticipated DC Date:     Anticipated DC Plan:        DC Planning Services  CM consult      Choice offered to / List presented to:             Status of service:  In process, will continue to follow Medicare Important Message given?   (If response is "NO", the following Medicare IM given date fields will be blank) Date Medicare IM given:   Medicare IM given by:   Date Additional Medicare IM given:   Additional Medicare IM given by:    Discharge Disposition:  HOME/SELF CARE  Per UR Regulation:    If discussed at Long Length of Stay Meetings, dates discussed:    Comments:  10/03/2014@ 0800 Whitman Hero RN,BSN,CM [phone] CM spoke with pt regarding Brilinta, pamphlet with 30 day free card given to pt.Pt states uses [facility], however , pharmacy states drug not instock, pt made aware. CM called [facility] [phone] and confirmed Kary Kos is in stock, pt made aware.

## 2014-10-03 NOTE — Progress Notes (Signed)
    Subjective:  No problems overnight  Objective:  Vital Signs in the last 24 hours: Temp:  [97 F (36.1 C)-97.8 F (36.6 C)] 97.6 F (36.4 C) (04/29 0400) Pulse Rate:  [56-68] 62 (04/29 0400) Resp:  [18-24] 19 (04/29 0400) BP: (131-160)/(75-95) 148/89 mmHg (04/29 0400) SpO2:  [96 %-98 %] 96 % (04/29 0400) Weight:  [217 lb 13 oz (98.8 kg)] 217 lb 13 oz (98.8 kg) (04/29 0500)  Intake/Output from previous day:  Intake/Output Summary (Last 24 hours) at 10/03/14 0718 Last data filed at 10/02/14 1209  Gross per 24 hour  Intake      0 ml  Output   1550 ml  Net  -1550 ml    Physical Exam: General appearance: alert, cooperative and no distress Lungs: clear to auscultation bilaterally Heart: regular rate and rhythm Extremities: Rt radial site without hematoma   Rate: 62  Rhythm: normal sinus rhythm and 3 beats WCT  Lab Results:  Recent Labs  10/02/14 0307 10/03/14 0257  WBC 5.5 6.5  HGB 13.3 13.4  PLT 182 198    Recent Labs  10/01/14 0303 10/03/14 0257  NA 138 139  K 4.2 3.8  CL 107 109  CO2 24 22  GLUCOSE 111* 112*  BUN 14 11  CREATININE 1.25 1.17    Recent Labs  09/30/14 2109 10/01/14 0303  TROPONINI <0.03 <0.03    Recent Labs  09/30/14 1710  INR 1.18    Scheduled Meds: . aspirin EC  81 mg Oral Daily  . cholecalciferol  1,000 Units Oral Daily  . fenofibrate  160 mg Oral Daily  . levothyroxine  112 mcg Oral QAC breakfast  . metoprolol succinate  100 mg Oral Daily  . ramipril  10 mg Oral Daily  . rosuvastatin  20 mg Oral Daily  . ticagrelor  90 mg Oral BID  . ascorbic acid  1,000 mg Oral Daily   Continuous Infusions:  PRN Meds:.acetaminophen, alum & mag hydroxide-simeth, nitroGLYCERIN, ondansetron (ZOFRAN) IV   Imaging: Imaging results have been reviewed  Cardiac Studies:  Assessment/Plan:   Principal Problem:   Unstable angina Active Problems:   CAD- CFX RCA PCI '02, DES CFX 10/02/14   Essential hypertension    Hypothyroidism   Hyperlipidemia   RBBB   PLAN: F/U Dr Dellia Beckwith PA-C 10/03/2014, 7:18 AM   Patient seen and examined and history reviewed. Agree with above findings and plan. Feels well. No chest pressure. Radial site looks good. Ready for DC today. On DAPT for one year.  Jarman Litton [place], [place] 10/03/2014 7:32 AM

## 2014-10-14 ENCOUNTER — Telehealth: Payer: Self-pay | Admitting: Cardiovascular Disease

## 2014-10-15 NOTE — Telephone Encounter (Signed)
Close encounter 

## 2014-10-16 ENCOUNTER — Ambulatory Visit: Payer: Medicare HMO | Admitting: Cardiology

## 2014-10-17 ENCOUNTER — Ambulatory Visit: Payer: Medicare HMO | Admitting: Cardiovascular Disease

## 2014-10-24 ENCOUNTER — Ambulatory Visit (INDEPENDENT_AMBULATORY_CARE_PROVIDER_SITE_OTHER): Payer: Medicare HMO | Admitting: Nurse Practitioner

## 2014-10-24 ENCOUNTER — Encounter: Payer: Self-pay | Admitting: Nurse Practitioner

## 2014-10-24 VITALS — BP 148/88 | HR 69 | Ht 68.0 in | Wt 219.8 lb

## 2014-10-24 DIAGNOSIS — Z955 Presence of coronary angioplasty implant and graft: Secondary | ICD-10-CM

## 2014-10-24 DIAGNOSIS — Z9889 Other specified postprocedural states: Secondary | ICD-10-CM | POA: Diagnosis not present

## 2014-10-24 DIAGNOSIS — I259 Chronic ischemic heart disease, unspecified: Secondary | ICD-10-CM

## 2014-10-24 LAB — CBC
HCT: 41.8 % (ref 39.0–52.0)
Hemoglobin: 14.2 g/dL (ref 13.0–17.0)
MCHC: 34 g/dL (ref 30.0–36.0)
MCV: 87.1 fl (ref 78.0–100.0)
Platelets: 216 10*3/uL (ref 150.0–400.0)
RBC: 4.8 Mil/uL (ref 4.22–5.81)
RDW: 14.1 % (ref 11.5–15.5)
WBC: 6.5 10*3/uL (ref 4.0–10.5)

## 2014-10-24 LAB — BASIC METABOLIC PANEL
BUN: 22 mg/dL (ref 6–23)
CO2: 27 mEq/L (ref 19–32)
Calcium: 9.6 mg/dL (ref 8.4–10.5)
Chloride: 105 mEq/L (ref 96–112)
Creatinine, Ser: 1.14 mg/dL (ref 0.40–1.50)
GFR: 67.61 mL/min (ref >60.00)
Glucose, Bld: 110 mg/dL — ABNORMAL HIGH (ref 70–99)
Potassium: 4.2 mEq/L (ref 3.5–5.1)
Sodium: 139 mEq/L (ref 135–145)

## 2014-10-24 NOTE — Progress Notes (Addendum)
CARDIOLOGY OFFICE NOTE  Date:  10/24/2014    Dennis [name] Date of Birth: April 06, 1946 Medical Record #[id number]  PCP:  Dennis Sheller, MD  Cardiologist:  Dennis [name]    Chief Complaint  Patient presents with  . Post cath visit    Seen for Dennis [name]    History of Present Illness: Dennis [name] is a 69 y.o. male who presents today for a post hospital visit. Seen for Dennis [name]. He has a history of prior RCA stent 2002, HTN, HLD followed by Dennis [name].   He was admitted 09/30/14 with unstable angina. Diagnostic cath on 09/30/14 per Dennis [name] [name] severe stenosis in the mid Circumflex. After review with the interventional team, Dennis [name] decided to proceed with PCI of the Circumflex but case delayed until 10/02/14 due to scheduling conflicts.On 10/02/14 the pt underwent PCI with DES to the mid CFX by Dennis [name]. He tolerated this well and was discharged 10/03/14.   Comes back today. Here alone. Doing great. BP lower as an outpatient. Has had repeat labs with Dennis [name] about 2 weeks ago - we do not have. No chest pain. Some mild SOB from the Brilinta but nothing that he can't tolerate. Walking about 20 to 30 minutes a day without issue.    Past Medical History  Diagnosis Date  . Hypertension   . CAD (coronary artery disease)     a. 2002 Stent RCA & circumflex EF40-50%   . Hyperlipidemia   . Thyroid nodule, hot     s/p radioactive iodine, now hypothyroidism  . Hypothyroidism   . Anginal pain   . Myocardial infarction 2002  . GERD (gastroesophageal reflux disease)     Past Surgical History  Procedure Laterality Date  . Coronary stent placement    . Left heart catheterization with coronary angiogram N/A 09/30/2014    Procedure: LEFT HEART CATHETERIZATION WITH CORONARY ANGIOGRAM;  Surgeon: Dennis Sine, MD;  Location: [facility] CATH LAB;  Service: Cardiovascular;  Laterality: N/A;  . Percutaneous coronary stent intervention (pci-s) N/A 10/02/2014    Procedure:  PERCUTANEOUS CORONARY STENT INTERVENTION (PCI-S);  Surgeon: Dennis Blanks, MD;  Location[facility] C[facility] LAB;  Service: Cardiovascular;  Laterality: N/A;     Medications: Current Outpatient Prescriptions  Medication Sig Dispense Refill  . ascorbic acid (VITAMIN C) 1000 MG tablet Take 1,000 mg by mouth daily.    Marland Kitchen aspirin 81 MG tablet Take 81 mg by mouth daily.    Marland Kitchen b complex vitamins tablet Take 1 tablet by mouth daily.    . cholecalciferol (VITAMIN D) 1000 UNITS tablet Take 1,000 Units by mouth daily.    . CRESTOR 40 MG tablet Take 20 mg by mouth 2 (two) times a week.     . fenofibrate 160 MG tablet Take 160 mg by mouth daily.    Marland Kitchen levothyroxine (SYNTHROID, LEVOTHROID) 112 MCG tablet Take 112 mcg by mouth daily.    . metoprolol succinate (TOPROL-XL) 100 MG 24 hr tablet Take 100 mg by mouth daily.    . nitroGLYCERIN (NITROSTAT) 0.4 MG SL tablet Place 1 tablet (0.4 mg total) under the tongue every 5 (five) minutes as needed for chest pain. 25 tablet 2  . ramipril (ALTACE) 10 MG capsule Take 10 mg by mouth daily.    . ticagrelor (BRILINTA) 90 MG TABS tablet Take 1 tablet (90 mg total) by mouth 2 (two) times daily. 60 tablet 11   No current facility-administered medications for this visit.  Allergies: Allergies  Allergen Reactions  . Sulfur     rash    Social History: The patient  reports that he quit smoking about 15 years ago. He has never used smokeless tobacco. He reports that he does not drink alcohol or use illicit drugs.   Family History: The patient's family history is not on file.   Review of Systems: Please see the history of present illness.    All other systems are reviewed and negative.   Physical Exam: VS:  BP 148/88 mmHg  Pulse 69  Ht 5\' 8"  (1.727 m)  Wt 219 lb 12.8 oz (99.701 kg)  BMI 33.43 kg/m2  SpO2 94% .  BMI Body mass index is 33.43 kg/(m^2).  Wt Readings from Last 3 Encounters:  10/24/14 219 lb 12.8 oz (99.701 kg)  10/03/14 217 lb 13 oz (98.8 kg)     General: Pleasant. Well developed, well nourished and in no acute distress.  HEENT: Normal. Neck: Supple, no JVD, carotid bruits, or masses noted.  Cardiac: Regular rate and rhythm. No murmurs, rubs, or gallops. No edema.  Respiratory:  Lungs are clear to auscultation bilaterally with normal work of breathing.  GI: Soft and nontender.  MS: No deformity or atrophy. Gait and ROM intact. Skin: Warm and dry. Color is normal.  Neuro:  Strength and sensation are intact and no gross focal deficits noted.  Psych: Alert, appropriate and with normal affect. Right wrist cath site stable.   LABORATORY DATA:  EKG:  EKG is not ordered today..  Lab Results  Component Value Date   WBC 6.5 10/03/2014   HGB 13.4 10/03/2014   HCT 40.9 10/03/2014   PLT 198 10/03/2014   GLUCOSE 112* 10/03/2014   CHOL 100 10/01/2014   TRIG 338* 10/01/2014   HDL 21* 10/01/2014   LDLCALC 11 10/01/2014   NA 139 10/03/2014   K 3.8 10/03/2014   CL 109 10/03/2014   CREATININE 1.17 10/03/2014   BUN 11 10/03/2014   CO2 22 10/03/2014   INR 1.18 09/30/2014    BNP (last 3 results) No results for input(s): BNP in the last 8760 hours.  ProBNP (last 3 results) No results for input(s): PROBNP in the last 8760 hours.  Lab Results  Component Value Date   TROPONINI <0.03 10/01/2014    Other Studies Reviewed Today:  1. PTCA/DES proximal Circumflex  Indication: 69 yo male with history of HTN, HLD, CAD admitted with unstable angina. Diagnostic cath on 09/30/14 per Dennis [name] and [name] to have severe stenosis in the mid Circumflex. After review with the interventional team, Dennis [name] decided to proceed with PCI of the Circumflex but case delayed until today due to scheduling conflicts.    Procedure Details: The risks, benefits, complications, treatment options, and expected outcomes were discussed with the patient. The patient and/or family concurred with the proposed plan, giving  informed consent. The patient was brought to the cath lab after IV hydration was begun and oral premedication was given. The patient was further sedated with Versed and Fentanyl. The right wrist was assessed with a modified Allens test which was positive. The right wrist was prepped and draped in a sterile fashion. 1% lidocaine was used for local anesthesia. Using the modified Seldinger access technique, a 5 French sheath was placed in the right radial artery. 3 mg Verapamil was given through the sheath. 10,000 units IV heparin was given. He was loaded with 180 mg Brilinta x 1 po. (loading dose given yesterday am but none  given last night).  PCI note: I engaged the left main with a XB 3.0 guiding catheter. When the ACT was over 200, I advanced a Cougar IC wire down the Circumflex. A 2.5 x 15 mm balloon was used to pre-dilate the stenosis in the mid vessel. I then deployed a 4.0 x 20 mm Promus Premier DES in the mid Circumflex. The stent was post-dilated with a 4.5 x 15 mm Flora balloon x 1. The stenosis was taken from 95% down to 0%. There was an excellent angiographic result with TIMI-3 flow down the vessel. The guide and wire were removed.   The sheath was removed from the right radial artery and a Terumo hemostasis band was applied at the arteriotomy site on the right wrist. There were no immediate complications. The patient was taken to the recovery area in stable condition.   Hemodynamic Findings: Central aortic pressure: 120/70  Impression: 1. Unstable angina secondary to severe stenosis mid Circumflex 2. Successful PTCA/DES x 1 mid Circumflex  Recommendations: He will need lifelong dual anti-platelet therapy. Would use ASA and Brilinta for one year then transition to ASA and Plavix. Continue beta blocker and statin. Monitor overnight. Follow up with Dennis [name].      Assessment/Plan: 1. CAD with recent successful DES to the mid LCX - his EF is 55% - he is committed to life long DAPT. He  is doing well clinically. Mild dyspnea - nothing that he can't tolerate. I have left him on his current regimen. Encouraged daily walking - goal is 45 minutes per day.   2. HLD - followed by his PCP  3. HTN - recheck is 140/80 by me. He will continue to monitor.    Current medicines are reviewed with the patient today.  The patient does not have concerns regarding medicines other than what has been noted above.  The following changes have been made:  See above.  Labs/ tests ordered today include:    Orders Placed This Encounter  Procedures  . Basic metabolic panel  . CBC     Disposition:   FU with Dennis [name] in 3 months.   Patient is agreeable to this plan and will call if any problems develop in the interim.   Signed: Burtis Junes, RN, ANP-C 10/24/2014 10:31 AM  [facility] [address] [place], [place]  [address] Phone: [phone] Fax: [phone]

## 2014-10-24 NOTE — Patient Instructions (Addendum)
We will be checking the following labs today - BMET/CBC   Medication Instructions:    Continue with your current medicines.     Testing/Procedures To Be Arranged:  N/A  Follow-Up:   See Dr. Gwenlyn Found in 3 months    Other Special Instructions:   Walking daily - goal is 45 minutes per day  Call the [place] office at [phone] if you have any questions, problems or concerns.

## 2014-10-27 ENCOUNTER — Telehealth: Payer: Self-pay | Admitting: Nurse Practitioner

## 2014-10-27 NOTE — Telephone Encounter (Signed)
PT RTN CALL TO [facility] TO CALL OR TEXT [phone]

## 2014-12-26 ENCOUNTER — Telehealth: Payer: Self-pay | Admitting: Nurse Practitioner

## 2014-12-26 NOTE — Telephone Encounter (Signed)
No it is NOT safe to take 800 mg of ibuprofen with his Brilinta.  Tylenol would be the safest.   If he has to use Ibuprofen - would only do 200 mg a day for a few days.  May need to go and see orthopedics.  NSAID and Brilinta increase your risk for bleeding.

## 2014-12-26 NOTE — Telephone Encounter (Signed)
New Prob   Pt injured his knee and calling to verify it is safe to take 800 mg Ibuprofen in addition to his Brilinta medication. Please call.

## 2014-12-26 NOTE — Telephone Encounter (Signed)
S/w pt is aware to only take ( 200 mg ) of ibuprofen for a few days but stated tylenol is the safest.    Pt stated was having a dream about jumping and ended up on the floor and hit knee, knee is now swollen. Pt stated no bone chips or anything torn.  Stated NSAID'S are okay. Stated might need to see ortho.

## 2015-01-20 ENCOUNTER — Encounter: Payer: Self-pay | Admitting: Cardiovascular Disease

## 2015-01-20 ENCOUNTER — Ambulatory Visit (INDEPENDENT_AMBULATORY_CARE_PROVIDER_SITE_OTHER): Payer: Medicare HMO | Admitting: Cardiovascular Disease

## 2015-01-20 VITALS — BP 146/80 | HR 72 | Ht 69.0 in | Wt 219.0 lb

## 2015-01-20 DIAGNOSIS — I2583 Coronary atherosclerosis due to lipid rich plaque: Principal | ICD-10-CM

## 2015-01-20 DIAGNOSIS — E785 Hyperlipidemia, unspecified: Secondary | ICD-10-CM | POA: Diagnosis not present

## 2015-01-20 DIAGNOSIS — I2510 Atherosclerotic heart disease of native coronary artery without angina pectoris: Secondary | ICD-10-CM | POA: Diagnosis not present

## 2015-01-20 NOTE — Assessment & Plan Note (Signed)
History of hyperlipidemia on Lipitor in the past ultimately on Crestor most recently discontinued by the patient because of myalgias. He is scheduled to have a lipid profile performed by his primary care physician. His LDL was 70 on Crestor. I suspect he should go back to Lipitor if he tolerates that drug.

## 2015-01-20 NOTE — Patient Instructions (Signed)
Dr Berry recommends that you schedule a follow-up appointment in 6 months. You will receive a reminder letter in the mail two months in advance. If you don't receive a letter, please call our office to schedule the follow-up appointment. 

## 2015-01-20 NOTE — Assessment & Plan Note (Signed)
History of CAD status post stenting of the RCA and circumflex by Dr. Glade Lloyd in 2002. The patient was admitted with unstable angina on 09/30/14 and was cathed by Dr. Claiborne Billings revealing a 90% stenosis on a bend in the circumflex complex coronary artery. 2 days later he underwent stenting by Dr. Julianne Handler with a Promus premiere stent postdilated with a 4.5 mm noncompliant balloon. He had normal LV function. He denies chest pain or shortness of breath.

## 2015-01-20 NOTE — Progress Notes (Signed)
01/20/2015 Dennis [name]   15-Nov-1945  [id number]  Primary Physician [name], MD Primary Cardiologist: Lorretta Harp MD Renae Gloss   HPI:  Dennis [name] is a 69 year old moderately overweight married Caucasian male with no children who is retired from traveling [employer]. He is a patient of Dr. Revonda Humphrey. History of hyperlipidemia and CAD status post RCA and circumflex intervention by Dr. Glade Lloyd back in 2002. He was admitted to [facility] 09/30/14 with unstable angina. He underwent cardiac catheterization that day by Dr. Claiborne Billings revealing a high-grade mid circumflex lesion on a bend and was stented 2 days later by Dr. Julianne Handler  with a Promus premiere drug eluting stent postdilated with a 4.5 mm balloon.. Since discharge and a symptomatic denies chest pain or shortness of breath.   Current Outpatient Prescriptions  Medication Sig Dispense Refill  . ascorbic acid (VITAMIN C) 1000 MG tablet Take 1,000 mg by mouth daily.    Marland Kitchen aspirin 81 MG tablet Take 81 mg by mouth daily.    Marland Kitchen b complex vitamins tablet Take 1 tablet by mouth daily.    . cholecalciferol (VITAMIN D) 1000 UNITS tablet Take 1,000 Units by mouth daily.    . CRESTOR 40 MG tablet Take 20 mg by mouth 2 (two) times a week.     . fenofibrate 160 MG tablet Take 160 mg by mouth daily.    Marland Kitchen levothyroxine (SYNTHROID, LEVOTHROID) 112 MCG tablet Take 112 mcg by mouth daily.    . metoprolol succinate (TOPROL-XL) 100 MG 24 hr tablet Take 100 mg by mouth daily.    . nitroGLYCERIN (NITROSTAT) 0.4 MG SL tablet Place 1 tablet (0.4 mg total) under the tongue every 5 (five) minutes as needed for chest pain. 25 tablet 2  . ramipril (ALTACE) 10 MG capsule Take 10 mg by mouth daily.    . ticagrelor (BRILINTA) 90 MG TABS tablet Take 1 tablet (90 mg total) by mouth 2 (two) times daily. 60 tablet 11   No current facility-administered medications for this visit.    Allergies  Allergen  Reactions  . Sulfur     rash    Social History   Social History  . Marital Status: Married    Spouse Name: N/A  . Number of Children: N/A  . Years of Education: N/A   Occupational History  . Not on file.   Social History Main Topics  . Smoking status: Former Smoker    Quit date: 06/07/1999  . Smokeless tobacco: Never Used  . Alcohol Use: No  . Drug Use: No  . Sexual Activity: Not on file   Other Topics Concern  . Not on file   Social History Narrative     Review of Systems: General: negative for chills, fever, night sweats or weight changes.  Cardiovascular: negative for chest pain, dyspnea on exertion, edema, orthopnea, palpitations, paroxysmal nocturnal dyspnea or shortness of breath Dermatological: negative for rash Respiratory: negative for cough or wheezing Urologic: negative for hematuria Abdominal: negative for nausea, vomiting, diarrhea, bright red blood per rectum, melena, or hematemesis Neurologic: negative for visual changes, syncope, or dizziness All other systems reviewed and are otherwise negative except as noted above.    Blood pressure 146/80, pulse 72, height 5\' 9"  (1.753 m), weight 219 lb (99.338 kg).  General appearance: alert and no distress Neck: no adenopathy, no carotid bruit, no JVD, supple, symmetrical, trachea midline and thyroid not enlarged, symmetric, no tenderness/mass/nodules Lungs: clear to auscultation bilaterally Heart: regular  rate and rhythm, S1, S2 normal, no murmur, click, rub or gallop Extremities: extremities normal, atraumatic, no cyanosis or edema  EKG not performed today  ASSESSMENT AND PLAN:   CAD- CFX RCA PCI '02, DES CFX 10/02/14 History of CAD status post stenting of the RCA and circumflex by Dr. Glade Lloyd in 2002. The patient was admitted with unstable angina on 09/30/14 and was cathed by Dr. Claiborne Billings revealing a 90% stenosis on a bend in the circumflex complex coronary artery. 2 days later he underwent stenting by Dr.  Julianne Handler with a Promus premiere stent postdilated with a 4.5 mm noncompliant balloon. He had normal LV function. He denies chest pain or shortness of breath.  Hyperlipidemia History of hyperlipidemia on Lipitor in the past ultimately on Crestor most recently discontinued by the patient because of myalgias. He is scheduled to have a lipid profile performed by his primary care physician. His LDL was 70 on Crestor. I suspect he should go back to Lipitor if he tolerates that drug.      Lorretta Harp MD FACP,FACC,FAHA, [facility] 01/20/2015 10:14 AM

## 2015-01-22 ENCOUNTER — Encounter: Payer: Self-pay | Admitting: Cardiovascular Disease

## 2015-01-23 ENCOUNTER — Encounter: Payer: Self-pay | Admitting: Cardiovascular Disease

## 2015-04-20 DIAGNOSIS — E039 Hypothyroidism, unspecified: Secondary | ICD-10-CM | POA: Diagnosis not present

## 2015-04-20 DIAGNOSIS — E559 Vitamin D deficiency, unspecified: Secondary | ICD-10-CM | POA: Diagnosis not present

## 2015-04-20 DIAGNOSIS — I509 Heart failure, unspecified: Secondary | ICD-10-CM | POA: Diagnosis not present

## 2015-04-20 DIAGNOSIS — R7309 Other abnormal glucose: Secondary | ICD-10-CM | POA: Diagnosis not present

## 2015-04-23 DIAGNOSIS — I509 Heart failure, unspecified: Secondary | ICD-10-CM | POA: Diagnosis not present

## 2015-04-23 DIAGNOSIS — I2510 Atherosclerotic heart disease of native coronary artery without angina pectoris: Secondary | ICD-10-CM | POA: Diagnosis not present

## 2015-04-23 DIAGNOSIS — R7309 Other abnormal glucose: Secondary | ICD-10-CM | POA: Diagnosis not present

## 2015-04-23 DIAGNOSIS — D692 Other nonthrombocytopenic purpura: Secondary | ICD-10-CM | POA: Diagnosis not present

## 2015-04-28 DIAGNOSIS — R69 Illness, unspecified: Secondary | ICD-10-CM | POA: Diagnosis not present

## 2015-05-06 DIAGNOSIS — R262 Difficulty in walking, not elsewhere classified: Secondary | ICD-10-CM | POA: Diagnosis not present

## 2015-05-06 DIAGNOSIS — W010XXD Fall on same level from slipping, tripping and stumbling without subsequent striking against object, subsequent encounter: Secondary | ICD-10-CM | POA: Diagnosis not present

## 2015-05-06 DIAGNOSIS — M79652 Pain in left thigh: Secondary | ICD-10-CM | POA: Diagnosis not present

## 2015-05-06 DIAGNOSIS — M6281 Muscle weakness (generalized): Secondary | ICD-10-CM | POA: Diagnosis not present

## 2015-05-07 DIAGNOSIS — Z23 Encounter for immunization: Secondary | ICD-10-CM | POA: Diagnosis not present

## 2015-05-12 DIAGNOSIS — M6281 Muscle weakness (generalized): Secondary | ICD-10-CM | POA: Diagnosis not present

## 2015-05-12 DIAGNOSIS — M79652 Pain in left thigh: Secondary | ICD-10-CM | POA: Diagnosis not present

## 2015-05-12 DIAGNOSIS — W010XXD Fall on same level from slipping, tripping and stumbling without subsequent striking against object, subsequent encounter: Secondary | ICD-10-CM | POA: Diagnosis not present

## 2015-05-12 DIAGNOSIS — R262 Difficulty in walking, not elsewhere classified: Secondary | ICD-10-CM | POA: Diagnosis not present

## 2015-05-26 DIAGNOSIS — W010XXD Fall on same level from slipping, tripping and stumbling without subsequent striking against object, subsequent encounter: Secondary | ICD-10-CM | POA: Diagnosis not present

## 2015-05-26 DIAGNOSIS — R262 Difficulty in walking, not elsewhere classified: Secondary | ICD-10-CM | POA: Diagnosis not present

## 2015-05-26 DIAGNOSIS — M6281 Muscle weakness (generalized): Secondary | ICD-10-CM | POA: Diagnosis not present

## 2015-05-26 DIAGNOSIS — M79652 Pain in left thigh: Secondary | ICD-10-CM | POA: Diagnosis not present

## 2015-06-02 DIAGNOSIS — R262 Difficulty in walking, not elsewhere classified: Secondary | ICD-10-CM | POA: Diagnosis not present

## 2015-06-02 DIAGNOSIS — W010XXD Fall on same level from slipping, tripping and stumbling without subsequent striking against object, subsequent encounter: Secondary | ICD-10-CM | POA: Diagnosis not present

## 2015-06-02 DIAGNOSIS — M79652 Pain in left thigh: Secondary | ICD-10-CM | POA: Diagnosis not present

## 2015-06-02 DIAGNOSIS — M6281 Muscle weakness (generalized): Secondary | ICD-10-CM | POA: Diagnosis not present

## 2015-06-09 DIAGNOSIS — M6281 Muscle weakness (generalized): Secondary | ICD-10-CM | POA: Diagnosis not present

## 2015-06-09 DIAGNOSIS — R262 Difficulty in walking, not elsewhere classified: Secondary | ICD-10-CM | POA: Diagnosis not present

## 2015-06-09 DIAGNOSIS — M79652 Pain in left thigh: Secondary | ICD-10-CM | POA: Diagnosis not present

## 2015-06-09 DIAGNOSIS — W010XXD Fall on same level from slipping, tripping and stumbling without subsequent striking against object, subsequent encounter: Secondary | ICD-10-CM | POA: Diagnosis not present

## 2015-08-20 DIAGNOSIS — Z85828 Personal history of other malignant neoplasm of skin: Secondary | ICD-10-CM | POA: Diagnosis not present

## 2015-08-20 DIAGNOSIS — L821 Other seborrheic keratosis: Secondary | ICD-10-CM | POA: Diagnosis not present

## 2015-08-20 DIAGNOSIS — D485 Neoplasm of uncertain behavior of skin: Secondary | ICD-10-CM | POA: Diagnosis not present

## 2015-08-20 DIAGNOSIS — L570 Actinic keratosis: Secondary | ICD-10-CM | POA: Diagnosis not present

## 2015-09-28 ENCOUNTER — Other Ambulatory Visit: Payer: Self-pay | Admitting: Cardiology

## 2015-10-16 DIAGNOSIS — Z125 Encounter for screening for malignant neoplasm of prostate: Secondary | ICD-10-CM | POA: Diagnosis not present

## 2015-10-16 DIAGNOSIS — R7309 Other abnormal glucose: Secondary | ICD-10-CM | POA: Diagnosis not present

## 2015-10-16 DIAGNOSIS — Z79899 Other long term (current) drug therapy: Secondary | ICD-10-CM | POA: Diagnosis not present

## 2015-10-16 DIAGNOSIS — I509 Heart failure, unspecified: Secondary | ICD-10-CM | POA: Diagnosis not present

## 2015-10-18 IMAGING — CR DG CHEST 1V PORT
2 series · 2 of 2 positions shown · non-contrast
Comparison: 01/17/2011

CLINICAL DATA: Chest pressure for 5 days. Diaphoresis today.
Indigestion.

EXAM:
PORTABLE CHEST - 1 VIEW

[ap (1 of 2)]
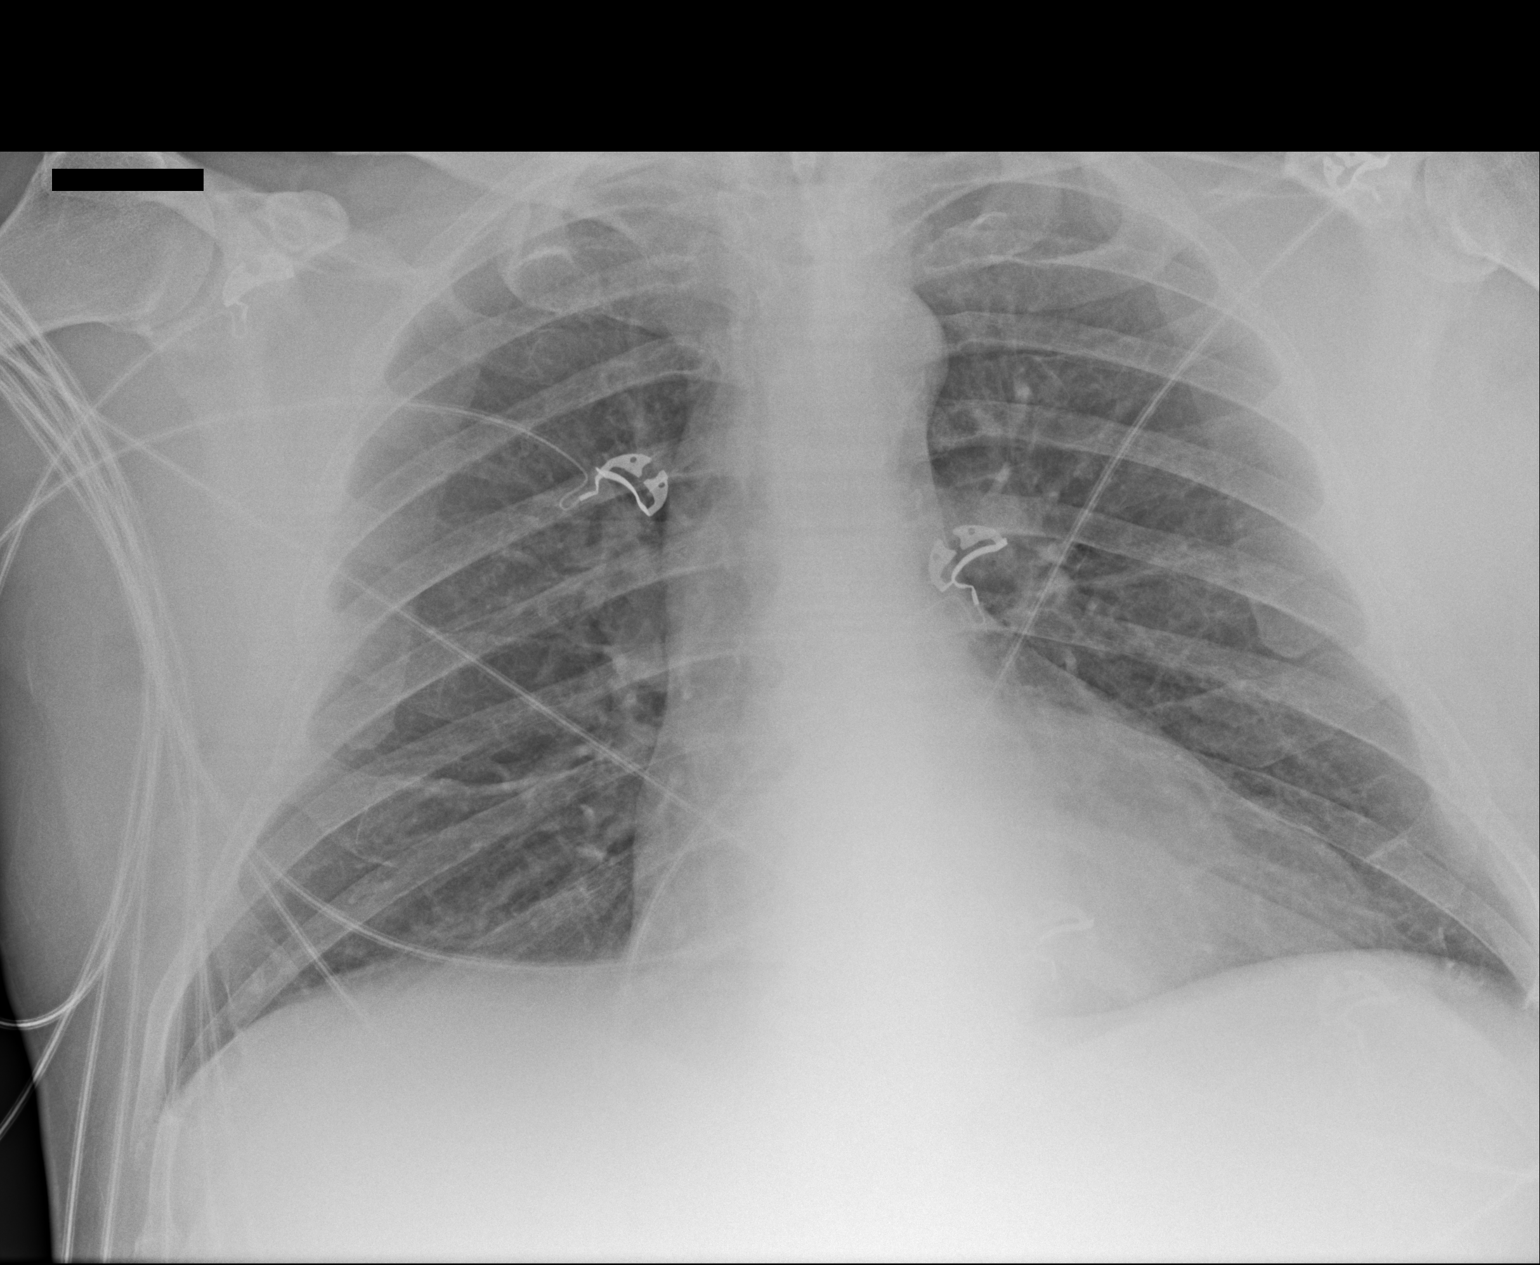

[ap (2 of 2)]
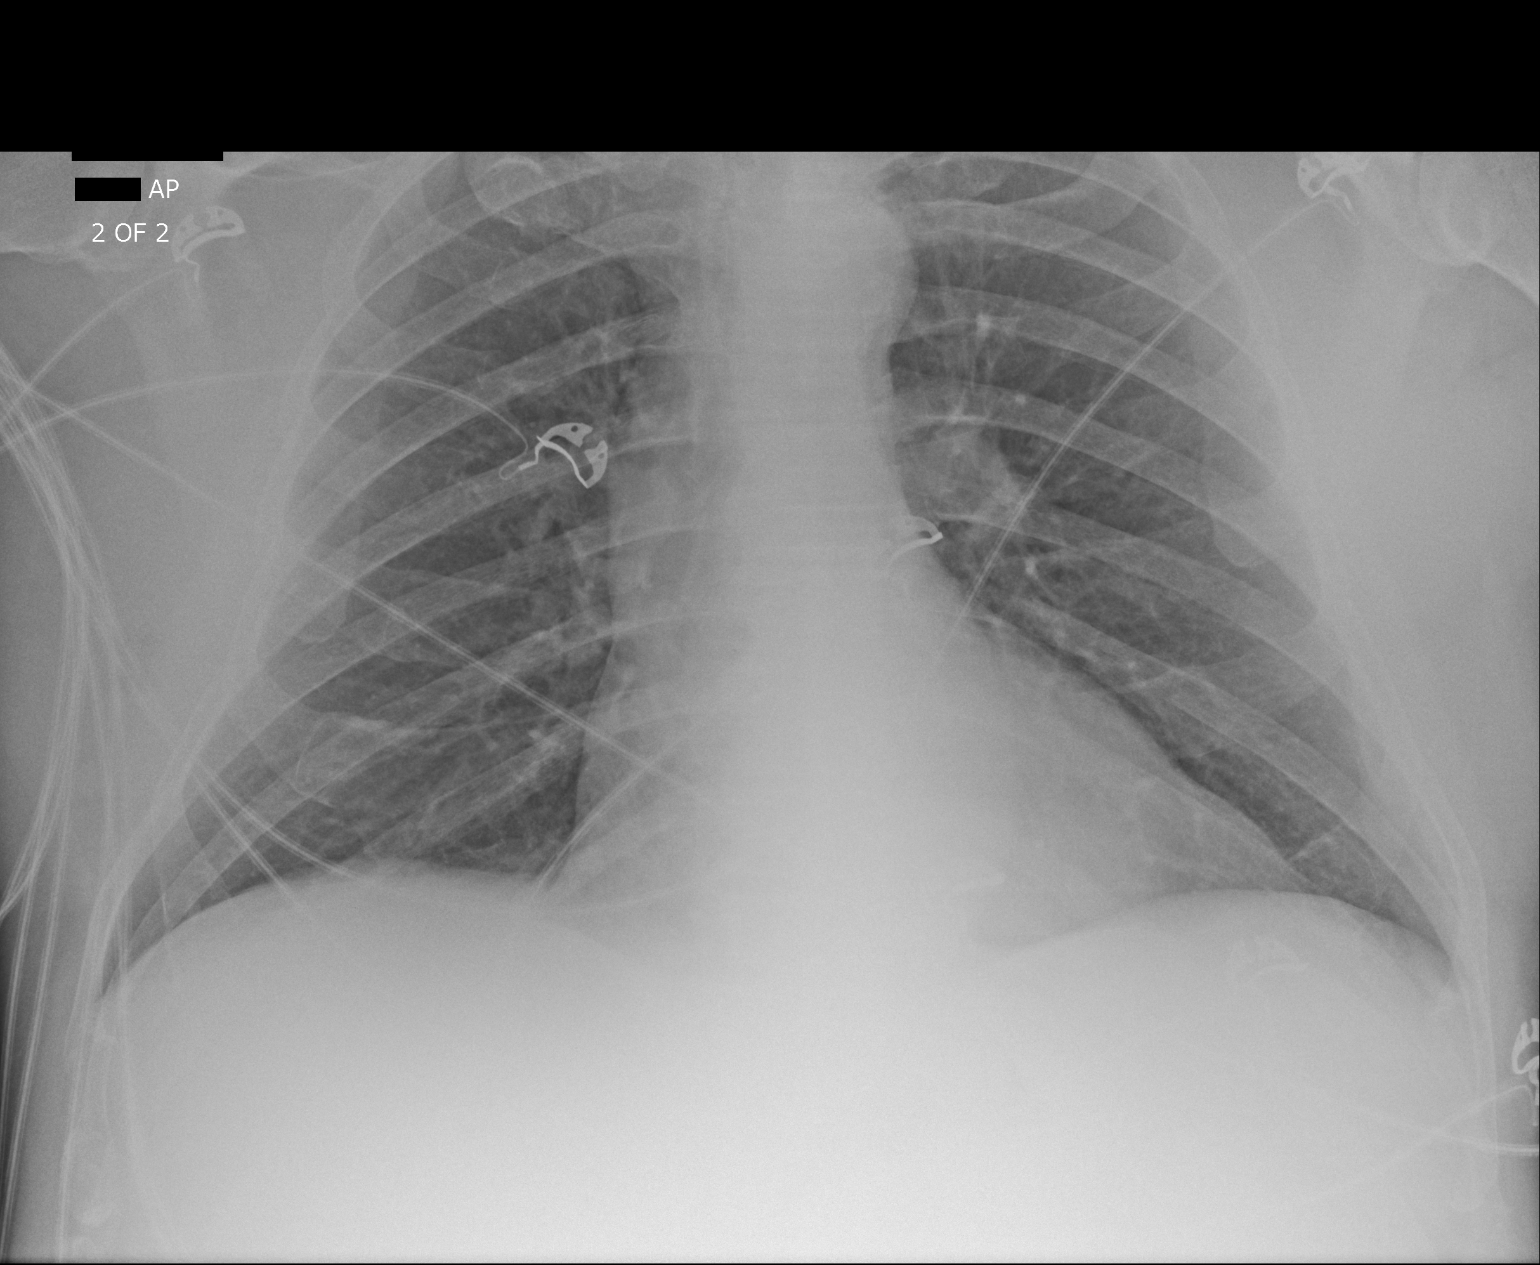

[2 of 2 positions shown; findings below may reference images not displayed]

FINDINGS: The heart size and mediastinal contours are within normal limits.
Both lungs are clear. The visualized skeletal structures are
unremarkable.
IMPRESSION: No active disease.

## 2015-10-20 DIAGNOSIS — D485 Neoplasm of uncertain behavior of skin: Secondary | ICD-10-CM | POA: Diagnosis not present

## 2015-10-20 DIAGNOSIS — D2339 Other benign neoplasm of skin of other parts of face: Secondary | ICD-10-CM | POA: Diagnosis not present

## 2015-10-20 DIAGNOSIS — Z85828 Personal history of other malignant neoplasm of skin: Secondary | ICD-10-CM | POA: Diagnosis not present

## 2015-10-23 DIAGNOSIS — I129 Hypertensive chronic kidney disease with stage 1 through stage 4 chronic kidney disease, or unspecified chronic kidney disease: Secondary | ICD-10-CM | POA: Diagnosis not present

## 2015-10-23 DIAGNOSIS — Z0001 Encounter for general adult medical examination with abnormal findings: Secondary | ICD-10-CM | POA: Diagnosis not present

## 2015-10-23 DIAGNOSIS — E785 Hyperlipidemia, unspecified: Secondary | ICD-10-CM | POA: Diagnosis not present

## 2015-10-23 DIAGNOSIS — I209 Angina pectoris, unspecified: Secondary | ICD-10-CM | POA: Diagnosis not present

## 2015-10-23 DIAGNOSIS — Z23 Encounter for immunization: Secondary | ICD-10-CM | POA: Diagnosis not present

## 2015-10-23 DIAGNOSIS — E6609 Other obesity due to excess calories: Secondary | ICD-10-CM | POA: Diagnosis not present

## 2015-10-23 DIAGNOSIS — R7309 Other abnormal glucose: Secondary | ICD-10-CM | POA: Diagnosis not present

## 2015-10-23 DIAGNOSIS — N182 Chronic kidney disease, stage 2 (mild): Secondary | ICD-10-CM | POA: Diagnosis not present

## 2015-10-23 DIAGNOSIS — Z1389 Encounter for screening for other disorder: Secondary | ICD-10-CM | POA: Diagnosis not present

## 2015-10-27 DIAGNOSIS — Z1212 Encounter for screening for malignant neoplasm of rectum: Secondary | ICD-10-CM | POA: Diagnosis not present

## 2015-10-27 DIAGNOSIS — Z1211 Encounter for screening for malignant neoplasm of colon: Secondary | ICD-10-CM | POA: Diagnosis not present

## 2016-03-03 DIAGNOSIS — Z23 Encounter for immunization: Secondary | ICD-10-CM | POA: Diagnosis not present

## 2016-03-30 ENCOUNTER — Other Ambulatory Visit: Payer: Self-pay | Admitting: Cardiovascular Disease

## 2016-03-30 NOTE — Telephone Encounter (Signed)
Rx(s) sent to pharmacy electronically.  

## 2016-04-13 ENCOUNTER — Ambulatory Visit (INDEPENDENT_AMBULATORY_CARE_PROVIDER_SITE_OTHER): Payer: Medicare HMO | Admitting: Cardiovascular Disease

## 2016-04-13 ENCOUNTER — Encounter: Payer: Self-pay | Admitting: Cardiovascular Disease

## 2016-04-13 VITALS — BP 150/82 | HR 67 | Ht 68.0 in | Wt 219.0 lb

## 2016-04-13 DIAGNOSIS — Z5181 Encounter for therapeutic drug level monitoring: Secondary | ICD-10-CM

## 2016-04-13 DIAGNOSIS — I10 Essential (primary) hypertension: Secondary | ICD-10-CM

## 2016-04-13 DIAGNOSIS — Z7902 Long term (current) use of antithrombotics/antiplatelets: Secondary | ICD-10-CM

## 2016-04-13 DIAGNOSIS — I2510 Atherosclerotic heart disease of native coronary artery without angina pectoris: Secondary | ICD-10-CM | POA: Diagnosis not present

## 2016-04-13 MED ORDER — CLOPIDOGREL BISULFATE 75 MG PO TABS: 75.0000 mg | ORAL_TABLET | Freq: Every day | ORAL | 3 refills | Status: DC

## 2016-04-13 NOTE — Assessment & Plan Note (Signed)
History of hypertension blood pressure measured at 150/82. He is on ramipril and metoprolol. Continue current meds (

## 2016-04-13 NOTE — Patient Instructions (Signed)
Medication Instructions: STOP Brilinta after finishing what you have left  Then, START Plavix 75 mg daily   Labwork: Your physician recommends that you return for lab work (P2Y12) in 2 weeks after starting Plavix at [place].   Follow-Up: Your physician wants you to follow-up in: 1 year with Dr. Gwenlyn Found. You will receive a reminder letter in the mail two months in advance. If you don't receive a letter, please call our office to schedule the follow-up appointment.   If you need a refill on your cardiac medications before your next appointment, please call your pharmacy.

## 2016-04-13 NOTE — Assessment & Plan Note (Signed)
History of RCA and circumflex stenting in 2002 by Dr. Glade Lloyd . He had unstable angina/26/16 and underwent cardiac catheterization by Dr. Claiborne Billings revealing a tight lesion in the midcircumflex on a bend which was stented 2 days later by Dr. Julianne Handler  with a Promus per meter drug-eluting stent postdilated to 4.5 mm. He's had no recurrent symptoms. He remains on aspirin and Brilenta perivascular occasional shortness of breath probably related to his Brilenta which we will discontinue and transition him Plavix. I will check a verify now test 2 weeks after beginning Plavix therapy

## 2016-04-13 NOTE — Progress Notes (Signed)
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 He is a patient of Dr. Revonda Humphrey. I last saw him in the office 01/20/15. History of hyperlipidemia and CAD status post RCA and circumflex intervention by Dr. Glade Lloyd back in 2002. He was admitted to [facility] 09/30/14 with unstable angina. He underwent cardiac catheterization that day by Dr. Claiborne Billings revealing a high-grade mid circumflex lesion on a bend and was stented 2 days later by Dr. Julianne Handler  with a Promus premiere drug eluting stent postdilated with a 4.5 mm balloon. Since I saw him last he has been essentially asymptomatic.   Current Outpatient Prescriptions  Medication Sig Dispense Refill  . ascorbic acid (VITAMIN C) 1000 MG tablet Take 1,000 mg by mouth daily.    Marland Kitchen aspirin 81 MG tablet Take 81 mg by mouth daily.    Marland Kitchen b complex vitamins tablet Take 1 tablet by mouth daily.    Marland Kitchen BRILINTA 90 MG TABS tablet TAKE ONE TABLET BY MOUTH TWICE DAILY 180 tablet 0  . cholecalciferol (VITAMIN D) 1000 UNITS tablet Take 1,000 Units by mouth daily.    . fenofibrate 160 MG tablet Take 160 mg by mouth daily.    Marland Kitchen levothyroxine (SYNTHROID, LEVOTHROID) 112 MCG tablet Take 112 mcg by mouth daily.    . metoprolol succinate (TOPROL-XL) 100 MG 24 hr tablet Take 100 mg by mouth daily.    . nitroGLYCERIN (NITROSTAT) 0.4 MG SL tablet Place 1 tablet (0.4 mg total) under the tongue every 5 (five) minutes as needed for chest pain. 25 tablet 2  . ramipril (ALTACE) 10 MG capsule Take 10 mg by mouth daily.    . sildenafil (REVATIO) 20 MG tablet Take 1 to 5 tablets by mouth daily as needed for erectile dysfunction.  5  . clopidogrel (PLAVIX) 75 MG tablet Take 1 tablet (75 mg total) by  mouth daily. 90 tablet 3   No current facility-administered medications for this visit.     Allergies  Allergen Reactions  . Sulfur     rash    Social History   Social History  . Marital status: Married    Spouse name: N/A  . Number of children: N/A  . Years of education: N/A   Occupational History  . Not on file.   Social History Main Topics  . Smoking status: Former Smoker    Quit date: 06/07/1999  . Smokeless tobacco: Never Used  . Alcohol use No  . Drug use: No  . Sexual activity: Not on file   Other Topics Concern  . Not on file   Social History Narrative  . No narrative on file     Review of Systems: General: negative for chills, fever, night sweats or weight changes.  Cardiovascular: negative for chest pain, dyspnea on exertion, edema, orthopnea, palpitations, paroxysmal nocturnal dyspnea or shortness of breath Dermatological: negative for rash Respiratory: negative for cough or wheezing Urologic: negative for hematuria Abdominal: negative for nausea, vomiting, diarrhea, bright red blood per rectum, melena, or hematemesis Neurologic: negative for visual changes, syncope, or dizziness All other systems reviewed and are otherwise negative except as noted above.    Blood pressure (!) 150/82, pulse 67, height 5\' 8"  (1.727 m), weight 219 lb (99.3 kg).  General  appearance: alert and no distress Neck: no adenopathy, no carotid bruit, no JVD, supple, symmetrical, trachea midline and thyroid not enlarged, symmetric, no tenderness/mass/nodules Lungs: clear to auscultation bilaterally Heart: regular rate and rhythm, S1, S2 normal, no murmur, click, rub or gallop Extremities: extremities normal, atraumatic, no cyanosis or edema  EKG sinus rhythm at 67 with right bundle branch block and left anterior fascicular block. I personally reviewed reviewed this EKG.  ASSESSMENT AND PLAN:   Hyperlipidemia History of hyperlipidemia on statin drugs and fenofibrate in the  past. His status was discontinued because of myalgias. His left upper profile in our chart performed 10/01/14 revealed total cholesterol 100, LDL of 1121. We will obtain his lab work from his PCP.  CAD- CFX RCA PCI '02, DES CFX 10/02/14 History of RCA and circumflex stenting in 2002 by Dr. Glade Lloyd . He had unstable angina/26/16 and underwent cardiac catheterization by Dr. Claiborne Billings revealing a tight lesion in the midcircumflex on a bend which was stented 2 days later by Dr. Julianne Handler  with a Promus per meter drug-eluting stent postdilated to 4.5 mm. He's had no recurrent symptoms. He remains on aspirin and Brilenta perivascular occasional shortness of breath probably related to his Brilenta which we will discontinue and transition him Plavix. I will check a verify now test 2 weeks after beginning Plavix therapy  Essential hypertension History of hypertension blood pressure measured at 150/82. He is on ramipril and metoprolol. Continue current meds Lorretta Harp MD [facility], [facility] 04/13/2016 9:48 AM

## 2016-04-13 NOTE — Assessment & Plan Note (Signed)
History of hyperlipidemia on statin drugs and fenofibrate in the past. His status was discontinued because of myalgias. His left upper profile in our chart performed 10/01/14 revealed total cholesterol 100, LDL of 1121. We will obtain his lab work from his PCP.

## 2016-04-18 DIAGNOSIS — R7309 Other abnormal glucose: Secondary | ICD-10-CM | POA: Diagnosis not present

## 2016-04-18 DIAGNOSIS — I129 Hypertensive chronic kidney disease with stage 1 through stage 4 chronic kidney disease, or unspecified chronic kidney disease: Secondary | ICD-10-CM | POA: Diagnosis not present

## 2016-04-18 DIAGNOSIS — E559 Vitamin D deficiency, unspecified: Secondary | ICD-10-CM | POA: Diagnosis not present

## 2016-04-18 DIAGNOSIS — N400 Enlarged prostate without lower urinary tract symptoms: Secondary | ICD-10-CM | POA: Diagnosis not present

## 2016-04-18 DIAGNOSIS — Z125 Encounter for screening for malignant neoplasm of prostate: Secondary | ICD-10-CM | POA: Diagnosis not present

## 2016-04-25 DIAGNOSIS — E785 Hyperlipidemia, unspecified: Secondary | ICD-10-CM | POA: Diagnosis not present

## 2016-04-25 DIAGNOSIS — Z0000 Encounter for general adult medical examination without abnormal findings: Secondary | ICD-10-CM | POA: Diagnosis not present

## 2016-04-25 DIAGNOSIS — E781 Pure hyperglyceridemia: Secondary | ICD-10-CM | POA: Diagnosis not present

## 2016-04-25 DIAGNOSIS — R972 Elevated prostate specific antigen [PSA]: Secondary | ICD-10-CM | POA: Diagnosis not present

## 2016-07-07 DIAGNOSIS — 419620001 Death: Secondary | SNOMED CT | POA: Diagnosis not present

## 2016-07-07 DEATH — deceased

## 2016-07-23 ENCOUNTER — Other Ambulatory Visit: Payer: Self-pay | Admitting: Cardiology

## 2016-10-18 DIAGNOSIS — Z0000 Encounter for general adult medical examination without abnormal findings: Secondary | ICD-10-CM | POA: Diagnosis not present

## 2016-10-18 DIAGNOSIS — E559 Vitamin D deficiency, unspecified: Secondary | ICD-10-CM | POA: Diagnosis not present

## 2016-10-18 DIAGNOSIS — Z125 Encounter for screening for malignant neoplasm of prostate: Secondary | ICD-10-CM | POA: Diagnosis not present

## 2016-10-18 DIAGNOSIS — E785 Hyperlipidemia, unspecified: Secondary | ICD-10-CM | POA: Diagnosis not present

## 2016-10-18 DIAGNOSIS — E039 Hypothyroidism, unspecified: Secondary | ICD-10-CM | POA: Diagnosis not present

## 2016-10-18 DIAGNOSIS — I2510 Atherosclerotic heart disease of native coronary artery without angina pectoris: Secondary | ICD-10-CM | POA: Diagnosis not present

## 2016-10-18 DIAGNOSIS — I10 Essential (primary) hypertension: Secondary | ICD-10-CM | POA: Diagnosis not present

## 2016-10-25 DIAGNOSIS — E785 Hyperlipidemia, unspecified: Secondary | ICD-10-CM | POA: Diagnosis not present

## 2016-10-25 DIAGNOSIS — I129 Hypertensive chronic kidney disease with stage 1 through stage 4 chronic kidney disease, or unspecified chronic kidney disease: Secondary | ICD-10-CM | POA: Diagnosis not present

## 2016-10-25 DIAGNOSIS — E039 Hypothyroidism, unspecified: Secondary | ICD-10-CM | POA: Diagnosis not present

## 2016-10-25 DIAGNOSIS — Z0000 Encounter for general adult medical examination without abnormal findings: Secondary | ICD-10-CM | POA: Diagnosis not present

## 2016-10-26 DIAGNOSIS — R69 Illness, unspecified: Secondary | ICD-10-CM | POA: Diagnosis not present

## 2016-11-03 ENCOUNTER — Telehealth: Payer: Self-pay | Admitting: Cardiovascular Disease

## 2016-11-03 DIAGNOSIS — R972 Elevated prostate specific antigen [PSA]: Secondary | ICD-10-CM | POA: Diagnosis not present

## 2016-11-03 NOTE — Telephone Encounter (Signed)
°  New Prob  Request for surgical clearance:  1. What type of surgery is being performed? Prostate ultrasound with biopsy  2. When is this surgery scheduled? 01/12/17  3. Are there any medications that need to be held prior to surgery and how long? Stop ASA (5 days prior) and Plavix (7 days prior).  4. Name of physician performing surgery? [facility]-Dr. Alyson Ingles  5. What is your office phone and fax number?  Phone: [phone]  Fax: [phone]   Please forward fax to Attn: Ladell Heads.

## 2016-11-03 NOTE — Telephone Encounter (Signed)
Pt on DAPT following DES 2 years ago (09/30/14)  Last seen by Dr. Gwenlyn Found Nov 2017 w rec'd return in 1 year.  Needing procedure clearance w ASA & Plavix hold.  fwd'd to Dr. Gwenlyn Found to review.

## 2016-11-07 ENCOUNTER — Encounter (HOSPITAL_COMMUNITY): Payer: Self-pay | Admitting: Emergency Medicine

## 2016-11-07 ENCOUNTER — Emergency Department (HOSPITAL_COMMUNITY): Payer: Medicare HMO

## 2016-11-07 DIAGNOSIS — Z7982 Long term (current) use of aspirin: Secondary | ICD-10-CM | POA: Insufficient documentation

## 2016-11-07 DIAGNOSIS — Z955 Presence of coronary angioplasty implant and graft: Secondary | ICD-10-CM | POA: Insufficient documentation

## 2016-11-07 DIAGNOSIS — I252 Old myocardial infarction: Secondary | ICD-10-CM | POA: Diagnosis not present

## 2016-11-07 DIAGNOSIS — I200 Unstable angina: Secondary | ICD-10-CM | POA: Diagnosis not present

## 2016-11-07 DIAGNOSIS — I10 Essential (primary) hypertension: Secondary | ICD-10-CM | POA: Diagnosis not present

## 2016-11-07 DIAGNOSIS — Z79899 Other long term (current) drug therapy: Secondary | ICD-10-CM | POA: Insufficient documentation

## 2016-11-07 DIAGNOSIS — I25110 Atherosclerotic heart disease of native coronary artery with unstable angina pectoris: Secondary | ICD-10-CM | POA: Diagnosis not present

## 2016-11-07 DIAGNOSIS — E039 Hypothyroidism, unspecified: Secondary | ICD-10-CM | POA: Insufficient documentation

## 2016-11-07 DIAGNOSIS — R0789 Other chest pain: Secondary | ICD-10-CM | POA: Diagnosis not present

## 2016-11-07 DIAGNOSIS — Z87891 Personal history of nicotine dependence: Secondary | ICD-10-CM | POA: Diagnosis not present

## 2016-11-07 LAB — COMPREHENSIVE METABOLIC PANEL
ALT: 16 U/L — ABNORMAL LOW (ref 17–63)
AST: 20 U/L (ref 15–41)
Albumin: 3.7 g/dL (ref 3.5–5.0)
Alkaline Phosphatase: 46 U/L (ref 38–126)
Anion gap: 11 (ref 5–15)
BUN: 12 mg/dL (ref 6–20)
CO2: 22 mmol/L (ref 22–32)
Calcium: 9.2 mg/dL (ref 8.9–10.3)
Chloride: 106 mmol/L (ref 101–111)
Creatinine, Ser: 1.09 mg/dL (ref 0.61–1.24)
GFR calc Af Amer: >60 mL/min (ref >60)
GFR calc non Af Amer: >60 mL/min (ref >60)
Glucose, Bld: 134 mg/dL — ABNORMAL HIGH (ref 65–99)
Potassium: 3.5 mmol/L (ref 3.5–5.1)
Sodium: 139 mmol/L (ref 135–145)
Total Bilirubin: 0.5 mg/dL (ref 0.3–1.2)
Total Protein: 6.4 g/dL — ABNORMAL LOW (ref 6.5–8.1)

## 2016-11-07 LAB — CBC WITH DIFFERENTIAL/PLATELET
Basophils Absolute: 0 10*3/uL (ref 0.0–0.1)
Basophils Relative: 1 %
Eosinophils Absolute: 0.2 10*3/uL (ref 0.0–0.7)
Eosinophils Relative: 4 %
HCT: 41.5 % (ref 39.0–52.0)
Hemoglobin: 13.6 g/dL (ref 13.0–17.0)
Lymphocytes Relative: 30 %
Lymphs Abs: 1.7 10*3/uL (ref 0.7–4.0)
MCH: 29.3 pg (ref 26.0–34.0)
MCHC: 32.8 g/dL (ref 30.0–36.0)
MCV: 89.4 fL (ref 78.0–100.0)
Monocytes Absolute: 0.6 10*3/uL (ref 0.1–1.0)
Monocytes Relative: 10 %
Neutro Abs: 3.1 10*3/uL (ref 1.7–7.7)
Neutrophils Relative %: 55 %
Platelets: 224 10*3/uL (ref 150–400)
RBC: 4.64 MIL/uL (ref 4.22–5.81)
RDW: 13.6 % (ref 11.5–15.5)
WBC: 5.6 10*3/uL (ref 4.0–10.5)

## 2016-11-07 LAB — TROPONIN I: Troponin I: <0.03 ng/mL (ref <0.03)

## 2016-11-07 NOTE — ED Triage Notes (Signed)
Pt to ED with c/o mid chest pressure.  Onset earlier today.  St's he took NTG 2 SL with some relief.  St's also he became nauseated.

## 2016-11-08 ENCOUNTER — Ambulatory Visit (INDEPENDENT_AMBULATORY_CARE_PROVIDER_SITE_OTHER): Payer: Medicare HMO | Admitting: Cardiovascular Disease

## 2016-11-08 ENCOUNTER — Emergency Department (HOSPITAL_COMMUNITY)
Admission: EM | Admit: 2016-11-08 | Discharge: 2016-11-08 | Discharge status: Against Medical Advice | Payer: Medicare HMO | Attending: Emergency Medicine | Admitting: Emergency Medicine

## 2016-11-08 ENCOUNTER — Encounter: Payer: Self-pay | Admitting: Cardiovascular Disease

## 2016-11-08 ENCOUNTER — Encounter (HOSPITAL_COMMUNITY): Payer: Self-pay | Admitting: Emergency Medicine

## 2016-11-08 VITALS — BP 148/90 | HR 80 | Ht 68.0 in | Wt 218.6 lb

## 2016-11-08 DIAGNOSIS — I25110 Atherosclerotic heart disease of native coronary artery with unstable angina pectoris: Secondary | ICD-10-CM | POA: Diagnosis not present

## 2016-11-08 DIAGNOSIS — E7800 Pure hypercholesterolemia, unspecified: Secondary | ICD-10-CM | POA: Diagnosis not present

## 2016-11-08 DIAGNOSIS — Z79899 Other long term (current) drug therapy: Secondary | ICD-10-CM | POA: Diagnosis not present

## 2016-11-08 DIAGNOSIS — Z87891 Personal history of nicotine dependence: Secondary | ICD-10-CM | POA: Diagnosis not present

## 2016-11-08 DIAGNOSIS — I10 Essential (primary) hypertension: Secondary | ICD-10-CM | POA: Diagnosis not present

## 2016-11-08 DIAGNOSIS — R079 Chest pain, unspecified: Secondary | ICD-10-CM | POA: Diagnosis not present

## 2016-11-08 DIAGNOSIS — I252 Old myocardial infarction: Secondary | ICD-10-CM | POA: Diagnosis not present

## 2016-11-08 DIAGNOSIS — Z955 Presence of coronary angioplasty implant and graft: Secondary | ICD-10-CM | POA: Diagnosis not present

## 2016-11-08 DIAGNOSIS — Z7982 Long term (current) use of aspirin: Secondary | ICD-10-CM | POA: Diagnosis not present

## 2016-11-08 DIAGNOSIS — I200 Unstable angina: Secondary | ICD-10-CM

## 2016-11-08 DIAGNOSIS — E039 Hypothyroidism, unspecified: Secondary | ICD-10-CM | POA: Diagnosis not present

## 2016-11-08 LAB — TROPONIN I: Troponin I: <0.03 ng/mL (ref <0.03)

## 2016-11-08 NOTE — ED Provider Notes (Signed)
Patient presented to the ER with chest pain. Patient had an episode of chest pain at home that resolved after 2 nitroglycerin.  Face to face Exam: HEENT - PERRLA Lungs - CTAB Heart - RRR, no M/R/G Abd - S/NT/ND Neuro - alert, oriented x3  Plan: Patient has now had 2 episodes of chest pain requiring nitroglycerin in the last month. He has a stent from 2002 as well as a stent from 2016. Since his stenting in 2016 he had not had any chest pain until these 2 episodes. This is concerning for unstable angina. Will consult cardiology for further management recommendations.   Orpah Greek, MD 11/08/16 [phone]

## 2016-11-08 NOTE — Progress Notes (Signed)
11/08/2016 JOHANNA STAFFORD   08-03-1945  [id number]  Primary Physician [name], MD Primary Cardiologist: Lorretta Harp MD Renae Gloss  HPI:    Mr. 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 He is a patient of Dr. Revonda Humphrey. I last saw him in the office 04/13/16. History of hyperlipidemia and CAD status post RCA and circumflex intervention by Dr. Glade Lloyd back in 2002. He was admitted to [facility] 09/30/14 with unstable angina. He underwent cardiac catheterization that day by Dr. Claiborne Billings revealing a high-grade mid circumflex lesion on a bend and was stented 2 days later by Dr. Julianne Handler with a Promus premiere drug eluting stent postdilated with a 4.5 mm balloon. Since I saw him last he has been essentially asymptomatic up until recently when he had an episode of nitrate responsive chest pain on Mother's Day and again late last night.   Current Outpatient Prescriptions  Medication Sig Dispense Refill  . ascorbic acid (VITAMIN C) 1000 MG tablet Take 1,000 mg by mouth daily.    Marland Kitchen aspirin 81 MG tablet Take 81 mg by mouth daily.    . cholecalciferol (VITAMIN D) 1000 UNITS tablet Take 1,000 Units by mouth daily.    . clopidogrel (PLAVIX) 75 MG tablet Take 1 tablet (75 mg total) by mouth daily. 90 tablet 3  . fenofibrate 160 MG tablet Take 160 mg by mouth daily.    Marland Kitchen levothyroxine (SYNTHROID, LEVOTHROID) 112 MCG tablet Take 112 mcg by mouth daily.    . magnesium oxide (MAG-OX) 400 MG tablet Take 400 mg by mouth daily.    . metoprolol succinate (TOPROL-XL) 100 MG 24 hr tablet Take 100 mg by mouth daily.    Marland Kitchen NITROSTAT 0.4 MG SL tablet PLACE ONE TABLET UNDER THE TONGUE EVERY 5 MINUTES AS NEEDED FOR CHEST PAIN 25 tablet 3  . omega-3 acid ethyl esters (LOVAZA) 1 g capsule Take 1 g by mouth 2 (two) times daily.    . ramipril (ALTACE) 10 MG capsule Take 10 mg by  mouth every evening.     . sildenafil (REVATIO) 20 MG tablet Take 1 to 5 tablets by mouth daily as needed for erectile dysfunction.  5   No current facility-administered medications for this visit.     Allergies  Allergen Reactions  . Sulfur     rash    Social History   Social History  . Marital status: Married    Spouse name: N/A  . Number of children: N/A  . Years of education: N/A   Occupational History  . Not on file.   Social History Main Topics  . Smoking status: Former Smoker    Quit date: 06/07/1999  . Smokeless tobacco: Never Used  . Alcohol use No  . Drug use: No  . Sexual activity: Not on file   Other Topics Concern  . Not on file   Social History Narrative  . No narrative on file     Review of Systems: General: negative for chills, fever, night sweats or weight changes.  Cardiovascular: negative for chest pain, dyspnea on exertion, edema, orthopnea, palpitations, paroxysmal nocturnal dyspnea or shortness of breath Dermatological: negative for rash Respiratory: negative for cough or wheezing Urologic: negative for hematuria Abdominal: negative for nausea, vomiting, diarrhea, bright red blood per rectum, melena, or hematemesis Neurologic: negative for visual changes, syncope, or dizziness All other systems reviewed and are otherwise  negative except as noted above.    Blood pressure (!) 148/90, pulse 80, height 5\' 8"  (1.727 m), weight 218 lb 9.6 oz (99.2 kg), SpO2 93 %.  General appearance: alert and no distress Neck: no adenopathy, no carotid bruit, no JVD, supple, symmetrical, trachea midline and thyroid not enlarged, symmetric, no tenderness/mass/nodules Lungs: clear to auscultation bilaterally Heart: regular rate and rhythm, S1, S2 normal, no murmur, click, rub or gallop Extremities: extremities normal, atraumatic, no cyanosis or edema  EKG not performed today  ASSESSMENT AND PLAN:   Hyperlipidemia History of hyperlipidemia on fenofibrate and  Lovaza followed by his PCP  CAD- CFX RCA PCI '02, DES CFX 10/02/14 History of CAD status post RCA and circumflex intervention by Dr. Glade Lloyd 2002. He was catheterized by Dr. Claiborne Billings 09/30/14 revealing a high-grade circumflex lesion on a bend and 2 days later underwent stenting by Dr. Angelena Form with a Promus premiere drug-eluting stent postdilated to 4.5 mm. He has had 2 episodes of nitrate responsive angina on Mother's Day and again last night similar to his prior symptoms. Based on this am going to recommend we proceed with outpatient cardiac cath. A right radial approach. The patient understands that risks included but are not limited to stroke (1 in 1000), death (1 in 73), kidney failure [usually temporary] (1 in 500), bleeding (1 in 200), allergic reaction [possibly serious] (1 in 200). The patient understands and agrees to proceed  Essential hypertension History of essential hypertension blood pressure measured 140/90. He is on metoprolol and ramipril. Continue current meds at current dosing      Lorretta Harp MD [facility], [facility] 11/08/2016 2:54 PM

## 2016-11-08 NOTE — Assessment & Plan Note (Signed)
History of hyperlipidemia on fenofibrate and Lovaza followed by his PCP

## 2016-11-08 NOTE — Discharge Instructions (Signed)
You have decided to leave against medical advice.  Please return promptly if your condition worsen or if you have any concerns.  Followup closely with your cardiologist for further care.

## 2016-11-08 NOTE — ED Provider Notes (Signed)
[facility] DEPT Provider Note   CSN: [id number] Arrival date & time: 11/07/16  1950     History   Chief Complaint Chief Complaint  Patient presents with  . Chest Pain    HPI Dennis [name] is a 71 y.o. male.  HPI   71 year old male with history of CAD status post stents of the RCA and circumflex in 2002, GERD, hypertension, hypothyroidism presenting with complaint of chest pain. Patient reported approximately 5 hours ago he was walking when he developed a central chest pressure with mild pain that reminiscent last time that he came to the ER and subsequently had a cardiac stent. States that the discomfort pressure sensation lasting for approximately 10-15 minutes, resolved after he took 2 sublingual nitroglycerin. He is currently chest pain-free. He did report some mild nausea, and lightheadedness during this episode. Pain was nonradiating. He is now back to his baseline. Denies any associated fever, chills, URI symptoms, shortness of breath, productive cough, abdominal pain, back pain, numbness or weakness. Denies any alcohol or street drug use specifically cocaine use. He is currently on Plavix. He has been compliant with his medical treatment. His cardiologist is Dr. Gwenlyn Found.  Past Medical History:  Diagnosis Date  . Anginal pain ([place])   . CAD (coronary artery disease)    a. 2002 Stent RCA & circumflex EF40-50%   . GERD (gastroesophageal reflux disease)   . Hyperlipidemia   . Hypertension   . Hypothyroidism   . Myocardial infarction ([place]) 2002  . Thyroid nodule, hot    s/p radioactive iodine, now hypothyroidism    Patient Active Problem List   Diagnosis Date Noted  . Essential hypertension 10/03/2014  . RBBB 10/03/2014  . Unstable angina ([place]) 09/30/2014  . Chest pain 09/30/2014  . Hypothyroidism   . Hyperlipidemia   . CAD- CFX RCA PCI '02, DES CFX 10/02/14   . Thyroid nodule, hot     Past Surgical History:  Procedure Laterality Date  . CORONARY STENT  PLACEMENT    . LEFT HEART CATHETERIZATION WITH CORONARY ANGIOGRAM N/A 09/30/2014   Procedure: LEFT HEART CATHETERIZATION WITH CORONARY ANGIOGRAM;  Surgeon: Troy Sine, MD;  Location: [facility] C[facility] LAB;  Service: Cardiovascular;  Laterality: N/A;  . PERCUTANEOUS CORONARY STENT INTERVENTION (PCI-S) N/A 10/02/2014   Procedure: PERCUTANEOUS CORONARY STENT INTERVENTION (PCI-S);  Surgeon: Burnell Blanks, MD;  Location: [facility] CATH LAB;  Service: Cardiovascular;  Laterality: N/A;       Home Medications    Prior to Admission medications   Medication Sig Start Date End Date Taking? Authorizing Provider  ascorbic acid (VITAMIN C) 1000 MG tablet Take 1,000 mg by mouth daily.    [provider]  aspirin 81 MG tablet Take 81 mg by mouth daily.    [provider]  b complex vitamins tablet Take 1 tablet by mouth daily.    [provider]  BRILINTA 90 MG TABS tablet TAKE ONE TABLET BY MOUTH TWICE DAILY 03/30/16   Lorretta Harp, MD  cholecalciferol (VITAMIN D) 1000 UNITS tablet Take 1,000 Units by mouth daily.    [provider]  clopidogrel (PLAVIX) 75 MG tablet Take 1 tablet (75 mg total) by mouth daily. 04/13/16   Lorretta Harp, MD  fenofibrate 160 MG tablet Take 160 mg by mouth daily. 07/28/14   [provider]  levothyroxine (SYNTHROID, LEVOTHROID) 112 MCG tablet Take 112 mcg by mouth daily. 09/19/14   [provider]  metoprolol succinate (TOPROL-XL) 100 MG 24 hr tablet  Take 100 mg by mouth daily. 07/18/14   [provider]  NITROSTAT 0.4 MG SL tablet PLACE ONE TABLET UNDER THE TONGUE EVERY 5 MINUTES AS NEEDED FOR CHEST PAIN 07/25/16   Lorretta Harp, MD  ramipril (ALTACE) 10 MG capsule Take 10 mg by mouth daily. 08/20/14   [provider]  sildenafil (REVATIO) 20 MG tablet Take 1 to 5 tablets by mouth daily as needed for erectile dysfunction. 03/10/16   [provider]    Family History No family history on  file.  Social History Social History  Substance Use Topics  . Smoking status: Former Smoker    Quit date: 06/07/1999  . Smokeless tobacco: Never Used  . Alcohol use No     Allergies   Sulfur   Review of Systems Review of Systems  All other systems reviewed and are negative.    Physical Exam Updated Vital Signs BP (!) 177/84 (BP Location: Right Arm)   Pulse (!) 58   Temp 97.8 F (36.6 C) (Oral)   Resp 18   Ht 5\' 8"  (1.727 m)   Wt 97.5 kg (215 lb)   SpO2 97%   BMI 32.69 kg/m   Physical Exam  Constitutional: He is oriented to person, place, and time. He appears well-developed and well-nourished. No distress.  HENT:  Head: Atraumatic.  Eyes: Conjunctivae are normal.  Neck: Neck supple. No JVD present.  Cardiovascular: Normal rate, regular rhythm and intact distal pulses.   Pulmonary/Chest: Effort normal and breath sounds normal. He exhibits no tenderness.  Abdominal: Soft. Bowel sounds are normal. He exhibits no distension. There is no tenderness.  Musculoskeletal: He exhibits no edema.  Neurological: He is alert and oriented to person, place, and time.  Skin: No rash noted.  Psychiatric: He has a normal mood and affect.  Nursing note and vitals reviewed.    ED Treatments / Results  Labs (all labs ordered are listed, but only abnormal results are displayed) Labs Reviewed  COMPREHENSIVE METABOLIC PANEL - Abnormal; Notable for the following:       Result Value   Glucose, Bld 134 (*)    Total Protein 6.4 (*)    ALT 16 (*)    All other components within normal limits  TROPONIN I  CBC WITH DIFFERENTIAL/PLATELET  TROPONIN I    EKG  EKG Interpretation  Date/Time:  Monday November 07 2016 19:52:16 EDT Ventricular Rate:  68 PR Interval:  170 QRS Duration: 144 QT Interval:  428 QTC Calculation: 455 R Axis:   -58 Text Interpretation:  Normal sinus rhythm Right bundle branch block Left axis deviation Left ventricular hypertrophy with QRS widening Abnormal ECG No  significant change since last tracing Confirmed by Orpah Greek [phone]) on 11/08/2016 12:46:40 AM       Radiology Dg Chest 2 View  Result Date: 11/07/2016 CLINICAL DATA:  Central chest pressure for 1 day EXAM: CHEST  2 VIEW COMPARISON:  09/29/2016 FINDINGS: The heart size and mediastinal contours are within normal limits. Both lungs are clear. There are degenerative changes of the spine. IMPRESSION: No active cardiopulmonary disease. Electronically Signed   By: Donavan Foil M.D.   On: 11/07/2016 21:14    Procedures Procedures (including critical care time)  Medications Ordered in ED Medications - No data to display   Initial Impression / Assessment and Plan / ED Course  I have reviewed the triage vital signs and the nursing notes.  Pertinent labs & imaging results that were available during my  care of the patient were reviewed by me and considered in my medical decision making (see chart for details).     BP (!) 177/84 (BP Location: Right Arm)   Pulse (!) 58   Temp 97.8 F (36.6 C) (Oral)   Resp 18   Ht 5\' 8"  (1.727 m)   Wt 97.5 kg (215 lb)   SpO2 97%   BMI 32.69 kg/m    Final Clinical Impressions(s) / ED Diagnoses   Final diagnoses:  Unstable angina (HCC)    New Prescriptions New Prescriptions   No medications on file   12:47 AM Patient with significant cardiac history here with angina equivalent pain. He is currently pain-free. No significant changes on his EKG, normal troponin, labs are reassuring. Patient voiced desire to be discharged. Given his significant cardiac history, can discuss with Dr. Betsey Holiday.  1:22 AM Appreciate consultation from on call cardiologist Dr. Radford Pax who request medicine admission to cycle enzymes.    1:46 AM I discussed plan of overnight admission with pt however pt prefers to go home.  He plan to f/u with his cardiologist at a later time. He understand the risk involved with leaving AMA including death.  He understand that he  can return if condition worsen.  Pt d/c AMA.  Normal delta trop.     Domenic Moras, PA-C 11/08/16 0150    Orpah Greek, MD 11/08/16 [phone]

## 2016-11-08 NOTE — Assessment & Plan Note (Signed)
History of essential hypertension blood pressure measured 140/90. He is on metoprolol and ramipril. Continue current meds at current dosing

## 2016-11-08 NOTE — Assessment & Plan Note (Signed)
History of CAD status post RCA and circumflex intervention by Dr. Glade Lloyd 2002. He was catheterized by Dr. Claiborne Billings 09/30/14 revealing a high-grade circumflex lesion on a bend and 2 days later underwent stenting by Dr. Angelena Form with a Promus premiere drug-eluting stent postdilated to 4.5 mm. He has had 2 episodes of nitrate responsive angina on Mother's Day and again last night similar to his prior symptoms. Based on this am going to recommend we proceed with outpatient cardiac cath. A right radial approach. The patient understands that risks included but are not limited to stroke (1 in 1000), death (1 in 57), kidney failure [usually temporary] (1 in 500), bleeding (1 in 200), allergic reaction [possibly serious] (1 in 200). The patient understands and agrees to proceed

## 2016-11-08 NOTE — Patient Instructions (Addendum)
   [facility] [address] [place] [address] Dept: [phone] Loc: [phone]  Dennis [name]  11/08/2016  You are scheduled for a Cardiac Catheterization on Thursday, June 14 with Dr. [name].  1. Please arrive at the [facility] (Main Entrance A) at [facility]: [address] [place], [place] [address] at 7:30 AM (two hours before your procedure to ensure your preparation). Free valet parking service is available.   Special note: Every effort is made to have your procedure done on time. Please understand that emergencies sometimes delay scheduled procedures.  2. Diet: Do not eat or drink anything after midnight prior to your procedure except sips of water to take medications.  3. Labs: You will need to have blood drawn on Monday, June 11 at [address], [place]  Open: 8am - 4:30pm (Lunch 12:30 - 1:30). You do not need to be fasting.  4. Medication instructions in preparation for your procedure:  On the morning of your procedure, take your Plavix/Clopidogrel and any morning medicines NOT listed above.  You may use sips of water.  5. Plan for one night stay--bring personal belongings. 6. Bring a current list of your medications and current insurance cards. 7. You MUST have a responsible person to drive you home. 8. Someone MUST be with you the first 24 hours after you arrive home or your discharge will be delayed. 9. Please wear clothes that are easy to get on and off and wear slip-on shoes.  Thank you for allowing [place] to care for you!   -- [facility]

## 2016-11-09 ENCOUNTER — Other Ambulatory Visit: Payer: Self-pay | Admitting: Cardiovascular Disease

## 2016-11-09 DIAGNOSIS — R079 Chest pain, unspecified: Secondary | ICD-10-CM

## 2016-11-10 DIAGNOSIS — R079 Chest pain, unspecified: Secondary | ICD-10-CM | POA: Diagnosis not present

## 2016-11-10 DIAGNOSIS — E7800 Pure hypercholesterolemia, unspecified: Secondary | ICD-10-CM | POA: Diagnosis not present

## 2016-11-10 LAB — PROTIME-INR
INR: 1 (ref 0.8–1.2)
Prothrombin Time: 10.5 s (ref 9.1–12.0)

## 2016-11-10 LAB — BASIC METABOLIC PANEL
BUN/Creatinine Ratio: 17 (ref 10–24)
BUN: 16 mg/dL (ref 8–27)
CO2: 22 mmol/L (ref 18–29)
Calcium: 9.3 mg/dL (ref 8.6–10.2)
Chloride: 101 mmol/L (ref 96–106)
Creatinine, Ser: 0.93 mg/dL (ref 0.76–1.27)
GFR calc Af Amer: 95 mL/min/{1.73_m2} (ref >59)
GFR calc non Af Amer: 82 mL/min/{1.73_m2} (ref >59)
Glucose: 101 mg/dL — ABNORMAL HIGH (ref 65–99)
Potassium: 4.4 mmol/L (ref 3.5–5.2)
Sodium: 139 mmol/L (ref 134–144)

## 2016-11-10 LAB — CBC WITH DIFFERENTIAL/PLATELET
Basophils Absolute: 0 10*3/uL (ref 0.0–0.2)
Basos: 1 %
EOS (ABSOLUTE): 0.1 10*3/uL (ref 0.0–0.4)
Eos: 3 %
Hematocrit: 41.6 % (ref 37.5–51.0)
Hemoglobin: 14.2 g/dL (ref 13.0–17.7)
Immature Grans (Abs): 0 10*3/uL (ref 0.0–0.1)
Immature Granulocytes: 0 %
Lymphocytes Absolute: 1.3 10*3/uL (ref 0.7–3.1)
Lymphs: 24 %
MCH: 30 pg (ref 26.6–33.0)
MCHC: 34.1 g/dL (ref 31.5–35.7)
MCV: 88 fL (ref 79–97)
Monocytes Absolute: 0.6 10*3/uL (ref 0.1–0.9)
Monocytes: 11 %
Neutrophils Absolute: 3.4 10*3/uL (ref 1.4–7.0)
Neutrophils: 61 %
Platelets: 222 10*3/uL (ref 150–379)
RBC: 4.74 x10E6/uL (ref 4.14–5.80)
RDW: 13.7 % (ref 12.3–15.4)
WBC: 5.5 10*3/uL (ref 3.4–10.8)

## 2016-11-10 LAB — TSH: TSH: 3.02 u[IU]/mL (ref 0.450–4.500)

## 2016-11-10 LAB — APTT: aPTT: 27 s (ref 24–33)

## 2016-11-14 ENCOUNTER — Telehealth: Payer: Self-pay | Admitting: Cardiovascular Disease

## 2016-11-14 NOTE — Telephone Encounter (Signed)
Called pt to remind him of CATH time change. Time was changed due to another Lower extremity angio case added on 6/14. Angio cases are done prior to heart cath, so his tim was changed to 1:00PM, he will need to be at the hospital at 11 AM, 2 hours prior to procedure. All other instructions for pt remain the same and were explained to pt at last ov.   Told pt to call with any further questions.  LM on pt VM--ok per DPR.

## 2016-11-15 NOTE — Telephone Encounter (Signed)
Scheduled for heart cath in the upcoming future. We'll hold on deciding interrupting antiplatelet therapy until after heart cath.

## 2016-11-16 ENCOUNTER — Telehealth: Payer: Self-pay

## 2016-11-16 DIAGNOSIS — S0100XA Unspecified open wound of scalp, initial encounter: Secondary | ICD-10-CM | POA: Diagnosis not present

## 2016-11-16 NOTE — Telephone Encounter (Signed)
VM left for Pt with pre-catheterization instruction.  Cath rescheduled for: 11/17/2016 @ 1300 Arrival time:  1100 Reminded Pt to take ASA 81 mg and Plavix along with any other morning meds Left this nurse name and # for call back if any further questions.

## 2016-11-16 NOTE — Telephone Encounter (Signed)
Call back received from Pt, went to VM.  Pt thanked this nurse for call.  Pt states he will take his ASA, Plavix and Synthroid in AM before appt.  Pt indicates understanding of cath instructions.  No further calls needed.

## 2016-11-17 ENCOUNTER — Other Ambulatory Visit: Payer: Self-pay

## 2016-11-17 ENCOUNTER — Encounter (HOSPITAL_COMMUNITY): Payer: Self-pay | Admitting: General Practice

## 2016-11-17 ENCOUNTER — Encounter (HOSPITAL_COMMUNITY)
Admission: RE | Disposition: A | Discharge status: Home / Self Care | Payer: Self-pay | Source: Ambulatory Visit | Attending: Cardiovascular Disease

## 2016-11-17 ENCOUNTER — Telehealth: Payer: Self-pay

## 2016-11-17 ENCOUNTER — Ambulatory Visit (HOSPITAL_COMMUNITY)
Admission: RE | Admit: 2016-11-17 | Discharge: 2016-11-18 | Disposition: A | Discharge status: Home / Self Care | Payer: Medicare HMO | Source: Ambulatory Visit | Attending: Cardiovascular Disease | Admitting: Cardiovascular Disease

## 2016-11-17 DIAGNOSIS — Z7982 Long term (current) use of aspirin: Secondary | ICD-10-CM | POA: Insufficient documentation

## 2016-11-17 DIAGNOSIS — Z6834 Body mass index (BMI) 34.0-34.9, adult: Secondary | ICD-10-CM | POA: Diagnosis not present

## 2016-11-17 DIAGNOSIS — E785 Hyperlipidemia, unspecified: Secondary | ICD-10-CM | POA: Insufficient documentation

## 2016-11-17 DIAGNOSIS — I4510 Unspecified right bundle-branch block: Secondary | ICD-10-CM | POA: Diagnosis not present

## 2016-11-17 DIAGNOSIS — Z87891 Personal history of nicotine dependence: Secondary | ICD-10-CM | POA: Insufficient documentation

## 2016-11-17 DIAGNOSIS — E039 Hypothyroidism, unspecified: Secondary | ICD-10-CM | POA: Diagnosis present

## 2016-11-17 DIAGNOSIS — I2583 Coronary atherosclerosis due to lipid rich plaque: Secondary | ICD-10-CM | POA: Diagnosis not present

## 2016-11-17 DIAGNOSIS — I200 Unstable angina: Secondary | ICD-10-CM | POA: Diagnosis present

## 2016-11-17 DIAGNOSIS — I2510 Atherosclerotic heart disease of native coronary artery without angina pectoris: Secondary | ICD-10-CM | POA: Diagnosis present

## 2016-11-17 DIAGNOSIS — Z882 Allergy status to sulfonamides status: Secondary | ICD-10-CM | POA: Diagnosis not present

## 2016-11-17 DIAGNOSIS — Z955 Presence of coronary angioplasty implant and graft: Secondary | ICD-10-CM | POA: Diagnosis not present

## 2016-11-17 DIAGNOSIS — E663 Overweight: Secondary | ICD-10-CM | POA: Insufficient documentation

## 2016-11-17 DIAGNOSIS — I25110 Atherosclerotic heart disease of native coronary artery with unstable angina pectoris: Secondary | ICD-10-CM | POA: Diagnosis not present

## 2016-11-17 DIAGNOSIS — I10 Essential (primary) hypertension: Secondary | ICD-10-CM | POA: Diagnosis not present

## 2016-11-17 DIAGNOSIS — Z7902 Long term (current) use of antithrombotics/antiplatelets: Secondary | ICD-10-CM | POA: Insufficient documentation

## 2016-11-17 DIAGNOSIS — R079 Chest pain, unspecified: Secondary | ICD-10-CM

## 2016-11-17 DIAGNOSIS — K219 Gastro-esophageal reflux disease without esophagitis: Secondary | ICD-10-CM | POA: Diagnosis not present

## 2016-11-17 DIAGNOSIS — Z9861 Coronary angioplasty status: Secondary | ICD-10-CM

## 2016-11-17 HISTORY — PX: CORONARY STENT INTERVENTION: CATH118234

## 2016-11-17 HISTORY — PX: LEFT HEART CATH AND CORONARY ANGIOGRAPHY: CATH118249

## 2016-11-17 HISTORY — DX: Squamous cell carcinoma of skin of scalp and neck: C44.42

## 2016-11-17 HISTORY — PX: INTRAVASCULAR PRESSURE WIRE/FFR STUDY: CATH118243

## 2016-11-17 SURGERY — LEFT HEART CATH AND CORONARY ANGIOGRAPHY
Anesthesia: LOCAL

## 2016-11-17 MED ORDER — ASPIRIN 81 MG PO CHEW
CHEWABLE_TABLET | ORAL | Status: AC
  Administered 2016-11-17: 81 mg via ORAL
  Filled 2016-11-17: qty 1

## 2016-11-17 MED ORDER — LIDOCAINE HCL (PF) 1 % IJ SOLN
INTRAMUSCULAR | Status: AC
  Filled 2016-11-17: qty 30

## 2016-11-17 MED ORDER — HEPARIN (PORCINE) IN NACL 2-0.9 UNIT/ML-% IJ SOLN
INTRAMUSCULAR | Status: AC
Start: 2016-11-17 — End: ?
  Filled 2016-11-17: qty 500

## 2016-11-17 MED ORDER — ADENOSINE (DIAGNOSTIC) 140MCG/KG/MIN
INTRAVENOUS | Status: DC | PRN
Start: 2016-11-17 — End: 2016-11-17
  Administered 2016-11-17: 140 ug/kg/min via INTRAVENOUS

## 2016-11-17 MED ORDER — SODIUM CHLORIDE 0.9 % WEIGHT BASED INFUSION
3.0000 mL/kg/h | INTRAVENOUS | Status: DC
  Administered 2016-11-17: 3 mL/kg/h via INTRAVENOUS

## 2016-11-17 MED ORDER — VITAMIN C 500 MG PO TABS: 1000.0000 mg | ORAL_TABLET | Freq: Every day | ORAL | Status: DC

## 2016-11-17 MED ORDER — HYDRALAZINE HCL 20 MG/ML IJ SOLN: 5.0000 mg | INTRAMUSCULAR | Status: AC | PRN

## 2016-11-17 MED ORDER — VERAPAMIL HCL 2.5 MG/ML IV SOLN
INTRA_ARTERIAL | Status: DC | PRN
  Administered 2016-11-17: 10 mL via INTRA_ARTERIAL

## 2016-11-17 MED ORDER — ADENOSINE 12 MG/4ML IV SOLN
INTRAVENOUS | Status: AC
  Filled 2016-11-17: qty 4

## 2016-11-17 MED ORDER — NITROGLYCERIN 0.4 MG SL SUBL: 0.4000 mg | SUBLINGUAL_TABLET | SUBLINGUAL | Status: DC | PRN

## 2016-11-17 MED ORDER — LABETALOL HCL 5 MG/ML IV SOLN: 10.0000 mg | INTRAVENOUS | Status: AC | PRN

## 2016-11-17 MED ORDER — MIDAZOLAM HCL 2 MG/2ML IJ SOLN
INTRAMUSCULAR | Status: AC
  Filled 2016-11-17: qty 2

## 2016-11-17 MED ORDER — VITAMIN D 1000 UNITS PO TABS
1000.0000 [IU] | ORAL_TABLET | Freq: Every day | ORAL | Status: DC
  Administered 2016-11-17: 18:00:00 1000 [IU] via ORAL
  Filled 2016-11-17: qty 1

## 2016-11-17 MED ORDER — MORPHINE SULFATE (PF) 4 MG/ML IV SOLN
2.0000 mg | INTRAVENOUS | Status: DC | PRN
Start: 2016-11-17 — End: 2016-11-18

## 2016-11-17 MED ORDER — FENTANYL CITRATE (PF) 100 MCG/2ML IJ SOLN
INTRAMUSCULAR | Status: DC | PRN
  Administered 2016-11-17 (×2): 25 ug via INTRAVENOUS

## 2016-11-17 MED ORDER — LIDOCAINE HCL (PF) 1 % IJ SOLN
INTRAMUSCULAR | Status: DC | PRN
  Administered 2016-11-17: 2 mL

## 2016-11-17 MED ORDER — SODIUM CHLORIDE 0.9% FLUSH: 3.0000 mL | INTRAVENOUS | Status: DC | PRN

## 2016-11-17 MED ORDER — METOPROLOL SUCCINATE ER 50 MG PO TB24
100.0000 mg | ORAL_TABLET | Freq: Every day | ORAL | Status: DC
  Filled 2016-11-17: qty 2

## 2016-11-17 MED ORDER — MIDAZOLAM HCL 2 MG/2ML IJ SOLN
INTRAMUSCULAR | Status: DC | PRN
  Administered 2016-11-17: 1 mg via INTRAVENOUS
  Administered 2016-11-17: 0.5 mg via INTRAVENOUS

## 2016-11-17 MED ORDER — NITROGLYCERIN 1 MG/10 ML FOR IR/CATH LAB
INTRA_ARTERIAL | Status: AC
Start: 2016-11-17 — End: ?
  Filled 2016-11-17: qty 10

## 2016-11-17 MED ORDER — ONDANSETRON HCL 4 MG/2ML IJ SOLN: 4.0000 mg | Freq: Four times a day (QID) | INTRAMUSCULAR | Status: DC | PRN

## 2016-11-17 MED ORDER — ACETAMINOPHEN 325 MG PO TABS: 650.0000 mg | ORAL_TABLET | ORAL | Status: DC | PRN

## 2016-11-17 MED ORDER — HEPARIN SODIUM (PORCINE) 1000 UNIT/ML IJ SOLN
INTRAMUSCULAR | Status: DC | PRN
  Administered 2016-11-17: 5000 [IU] via INTRAVENOUS

## 2016-11-17 MED ORDER — OMEGA-3-ACID ETHYL ESTERS 1 G PO CAPS
1.0000 g | ORAL_CAPSULE | Freq: Two times a day (BID) | ORAL | Status: DC
  Filled 2016-11-17: qty 1

## 2016-11-17 MED ORDER — CLOPIDOGREL BISULFATE 75 MG PO TABS: 75.0000 mg | ORAL_TABLET | Freq: Every day | ORAL | Status: DC

## 2016-11-17 MED ORDER — BIVALIRUDIN BOLUS VIA INFUSION - CUPID
INTRAVENOUS | Status: DC | PRN
  Administered 2016-11-17: 73.125 mg via INTRAVENOUS

## 2016-11-17 MED ORDER — LEVOTHYROXINE SODIUM 112 MCG PO TABS
112.0000 ug | ORAL_TABLET | Freq: Every day | ORAL | Status: DC
  Administered 2016-11-18: 07:00:00 112 ug via ORAL
  Filled 2016-11-17: qty 1

## 2016-11-17 MED ORDER — MAGNESIUM OXIDE 400 (241.3 MG) MG PO TABS
400.0000 mg | ORAL_TABLET | Freq: Every day | ORAL | Status: DC
  Administered 2016-11-17: 18:00:00 400 mg via ORAL
  Filled 2016-11-17: qty 1

## 2016-11-17 MED ORDER — VERAPAMIL HCL 2.5 MG/ML IV SOLN
INTRAVENOUS | Status: AC
  Filled 2016-11-17: qty 2

## 2016-11-17 MED ORDER — SODIUM CHLORIDE 0.9 % IV SOLN: 250.0000 mL | INTRAVENOUS | Status: DC | PRN

## 2016-11-17 MED ORDER — ASPIRIN 81 MG PO CHEW
81.0000 mg | CHEWABLE_TABLET | ORAL | Status: AC
Start: 2016-11-17 — End: 2016-11-17
  Administered 2016-11-17: 81 mg via ORAL

## 2016-11-17 MED ORDER — SODIUM CHLORIDE 0.9 % IV SOLN
INTRAVENOUS | Status: DC | PRN
  Administered 2016-11-17: 1.75 mg/kg/h via INTRAVENOUS

## 2016-11-17 MED ORDER — IOPAMIDOL (ISOVUE-370) INJECTION 76%
INTRAVENOUS | Status: AC
Start: 2016-11-17 — End: ?
  Filled 2016-11-17: qty 50

## 2016-11-17 MED ORDER — ASPIRIN EC 81 MG PO TBEC: 81.0000 mg | DELAYED_RELEASE_TABLET | Freq: Every evening | ORAL | Status: DC

## 2016-11-17 MED ORDER — IOPAMIDOL (ISOVUE-370) INJECTION 76%
INTRAVENOUS | Status: AC
Start: 2016-11-17 — End: ?
  Filled 2016-11-17: qty 100

## 2016-11-17 MED ORDER — SODIUM CHLORIDE 0.9 % IV SOLN: INTRAVENOUS | Status: AC

## 2016-11-17 MED ORDER — ADENOSINE 12 MG/4ML IV SOLN
INTRAVENOUS | Status: AC
Start: 2016-11-17 — End: ?
  Filled 2016-11-17: qty 4

## 2016-11-17 MED ORDER — ASPIRIN 81 MG PO CHEW: 81.0000 mg | CHEWABLE_TABLET | Freq: Every day | ORAL | Status: DC

## 2016-11-17 MED ORDER — RAMIPRIL 10 MG PO CAPS
10.0000 mg | ORAL_CAPSULE | Freq: Every evening | ORAL | Status: DC
  Administered 2016-11-17: 10 mg via ORAL
  Filled 2016-11-17: qty 1

## 2016-11-17 MED ORDER — FENOFIBRATE 160 MG PO TABS: 160.0000 mg | ORAL_TABLET | Freq: Every day | ORAL | Status: DC

## 2016-11-17 MED ORDER — FENTANYL CITRATE (PF) 100 MCG/2ML IJ SOLN
INTRAMUSCULAR | Status: AC
  Filled 2016-11-17: qty 2

## 2016-11-17 MED ORDER — HEPARIN (PORCINE) IN NACL 2-0.9 UNIT/ML-% IJ SOLN
INTRAMUSCULAR | Status: AC | PRN
  Administered 2016-11-17: 1000 mL

## 2016-11-17 MED ORDER — SODIUM CHLORIDE 0.9 % WEIGHT BASED INFUSION: 1.0000 mL/kg/h | INTRAVENOUS | Status: DC

## 2016-11-17 MED ORDER — IOPAMIDOL (ISOVUE-370) INJECTION 76%
INTRAVENOUS | Status: DC | PRN
  Administered 2016-11-17: 150 mL via INTRA_ARTERIAL

## 2016-11-17 MED ORDER — SODIUM CHLORIDE 0.9% FLUSH: 3.0000 mL | Freq: Two times a day (BID) | INTRAVENOUS | Status: DC

## 2016-11-17 MED ORDER — BIVALIRUDIN TRIFLUOROACETATE 250 MG IV SOLR
INTRAVENOUS | Status: AC
Start: 2016-11-17 — End: ?
  Filled 2016-11-17: qty 250

## 2016-11-17 SURGICAL SUPPLY — 24 items
BALLN EMERGE MR 2.0X12 (BALLOONS) ×2
BALLN ~~LOC~~ EMERGE MR 3.25X12 (BALLOONS) ×2
BALLOON EMERGE MR 2.0X12 (BALLOONS) ×1 IMPLANT
BALLOON ~~LOC~~ EMERGE MR 3.25X12 (BALLOONS) ×1 IMPLANT
CATH INFINITI 5FR ANG PIGTAIL (CATHETERS) ×2 IMPLANT
CATH INFINITI 5FR JL4 (CATHETERS) ×2 IMPLANT
CATH MICROCATH NAVVUS (MICROCATHETER) ×1 IMPLANT
CATH OPTITORQUE TIG 4.0 5F (CATHETERS) ×2 IMPLANT
CATH VISTA GUIDE 6FR XB3.5 (CATHETERS) ×2 IMPLANT
DEVICE RAD COMP TR BAND LRG (VASCULAR PRODUCTS) ×2 IMPLANT
GLIDESHEATH SLEND A-KIT 6F 22G (SHEATH) ×2 IMPLANT
GUIDEWIRE INQWIRE 1.5J.035X260 (WIRE) ×1 IMPLANT
INQWIRE 1.5J .035X260CM (WIRE) ×2
KIT ENCORE 26 ADVANTAGE (KITS) ×2 IMPLANT
KIT ESSENTIALS PG (KITS) ×2 IMPLANT
KIT HEART LEFT (KITS) ×2 IMPLANT
MICROCATHETER NAVVUS (MICROCATHETER) ×2
PACK CARDIAC CATHETERIZATION (CUSTOM PROCEDURE TRAY) ×2 IMPLANT
STENT SYNERGY DES 3X12 (Permanent Stent) ×2 IMPLANT
SYR MEDRAD MARK V 150ML (SYRINGE) ×2 IMPLANT
TRANSDUCER W/STOPCOCK (MISCELLANEOUS) ×2 IMPLANT
TUBING CIL FLEX 10 FLL-RA (TUBING) ×2 IMPLANT
WIRE ASAHI PROWATER 180CM (WIRE) ×2 IMPLANT
WIRE HI TORQ VERSACORE-J 145CM (WIRE) ×2 IMPLANT

## 2016-11-17 NOTE — Care Management Note (Signed)
Case Management Note  Patient Details  Name: Dennis [name] MRN: [id number] Date of Birth: [birth year]  Subjective/Objective:    S/p stent intervention, will be on plavix. He has [employer]  PCP [name]                Action/Plan: NCM will follow for dc needs.   Expected Discharge Date:                  Expected Discharge Plan:     In-House Referral:     Discharge planning Services  CM Consult  Post Acute Care Choice:    Choice offered to:     DME Arranged:    DME Agency:     HH Arranged:    HH Agency:     Status of Service:  In process, will continue to follow  If discussed at Long Length of Stay Meetings, dates discussed:    Additional Comments:  [name], RN 11/17/2016, 5:00 PM

## 2016-11-17 NOTE — Telephone Encounter (Signed)
Pt calling, wanted to confirm hospital entrance he should use.  Gave directions to the [facility] and how to proceed to admissions.  Pt indicates understanding.

## 2016-11-17 NOTE — H&P (View-Only) (Signed)
11/08/2016 Dennis [name]   [birth year]  [id number]  Primary Physician Dennis Sheller, MD Primary Cardiologist: Dennis Harp MD Dennis [name]  HPI:    [name] is a 71 year old moderately overweight married Caucasian male with no children who is retired from traveling [employer]. He is a patient of Dr. Revonda [name]. I last saw him in the office 04/13/16. History of hyperlipidemia and CAD status post RCA and circumflex intervention by Dr. Glade [name] back in 2002. He was admitted to [facility] 09/30/14 with unstable angina. He underwent cardiac catheterization that day by Dr. Claiborne [name] revealing a high-grade mid circumflex lesion on a bend and was stented 2 days later by Dr. Julianne [name] with a Promus premiere drug eluting stent postdilated with a 4.5 mm balloon. Since I saw him last he has been essentially asymptomatic up until recently when he had an episode of nitrate responsive chest pain on Mother's Day and again late last night.   Current Outpatient Prescriptions  Medication Sig Dispense Refill  . ascorbic acid (VITAMIN C) 1000 MG tablet Take 1,000 mg by mouth daily.    Marland Kitchen aspirin 81 MG tablet Take 81 mg by mouth daily.    . cholecalciferol (VITAMIN D) 1000 UNITS tablet Take 1,000 Units by mouth daily.    . clopidogrel (PLAVIX) 75 MG tablet Take 1 tablet (75 mg total) by mouth daily. 90 tablet 3  . fenofibrate 160 MG tablet Take 160 mg by mouth daily.    Marland Kitchen levothyroxine (SYNTHROID, LEVOTHROID) 112 MCG tablet Take 112 mcg by mouth daily.    . magnesium oxide (MAG-OX) 400 MG tablet Take 400 mg by mouth daily.    . metoprolol succinate (TOPROL-XL) 100 MG 24 hr tablet Take 100 mg by mouth daily.    Marland Kitchen NITROSTAT 0.4 MG SL tablet PLACE ONE TABLET UNDER THE TONGUE EVERY 5 MINUTES AS NEEDED FOR CHEST PAIN 25 tablet 3  . omega-3 acid ethyl esters (LOVAZA) 1 g capsule Take 1 g by mouth 2 (two) times daily.    . ramipril (ALTACE) 10 MG capsule Take 10 mg by  mouth every evening.     . sildenafil (REVATIO) 20 MG tablet Take 1 to 5 tablets by mouth daily as needed for erectile dysfunction.  5   No current facility-administered medications for this visit.     Allergies  Allergen Reactions  . Sulfur     rash    Social History   Social History  . Marital status: Married    Spouse name: N/A  . Number of children: N/A  . Years of education: N/A   Occupational History  . Not on file.   Social History Main Topics  . Smoking status: Former Smoker    Quit date: 06/07/1999  . Smokeless tobacco: Never Used  . Alcohol use No  . Drug use: No  . Sexual activity: Not on file   Other Topics Concern  . Not on file   Social History Narrative  . No narrative on file     Review of Systems: General: negative for chills, fever, night sweats or weight changes.  Cardiovascular: negative for chest pain, dyspnea on exertion, edema, orthopnea, palpitations, paroxysmal nocturnal dyspnea or shortness of breath Dermatological: negative for rash Respiratory: negative for cough or wheezing Urologic: negative for hematuria Abdominal: negative for nausea, vomiting, diarrhea, bright red blood per rectum, melena, or hematemesis Neurologic: negative for visual changes, syncope, or dizziness All other systems reviewed and are otherwise  negative except as noted above.    Blood pressure (!) 148/90, pulse 80, height 5\' 8"  (1.727 m), weight 218 lb 9.6 oz (99.2 kg), SpO2 93 %.  General appearance: alert and no distress Neck: no adenopathy, no carotid bruit, no JVD, supple, symmetrical, trachea midline and thyroid not enlarged, symmetric, no tenderness/mass/nodules Lungs: clear to auscultation bilaterally Heart: regular rate and rhythm, S1, S2 normal, no murmur, click, rub or gallop Extremities: extremities normal, atraumatic, no cyanosis or edema  EKG not performed today  ASSESSMENT AND PLAN:   Hyperlipidemia History of hyperlipidemia on fenofibrate and  Lovaza followed by his PCP  CAD- CFX RCA PCI '02, DES CFX 10/02/14 History of CAD status post RCA and circumflex intervention by Dr. Glade [name] 2002. He was catheterized by Dr. Claiborne [name] 09/30/14 revealing a high-grade circumflex lesion on a bend and 2 days later underwent stenting by Dr. Angelena [name] with a Promus premiere drug-eluting stent postdilated to 4.5 mm. He has had 2 episodes of nitrate responsive angina on Mother's Day and again last night similar to his prior symptoms. Based on this am going to recommend we proceed with outpatient cardiac cath. A right radial approach. The patient understands that risks included but are not limited to stroke (1 in 1000), death (1 in 43), kidney failure [usually temporary] (1 in 500), bleeding (1 in 200), allergic reaction [possibly serious] (1 in 200). The patient understands and agrees to proceed  Essential hypertension History of essential hypertension blood pressure measured 140/90. He is on metoprolol and ramipril. Continue current meds at current dosing      Dennis Harp MD Dennis [name], [name] 11/08/2016 2:54 PM

## 2016-11-17 NOTE — Interval H&P Note (Signed)
Cath Lab Visit (complete for each Cath Lab visit)  Clinical Evaluation Leading to the Procedure:   ACS: No.  Non-ACS:    Anginal Classification: CCS III  Anti-ischemic medical therapy: No Therapy  Non-Invasive Test Results: No non-invasive testing performed  Prior CABG: No previous CABG      History and Physical Interval Note:  11/17/2016 1:35 PM  Dennis [name]  has presented today for surgery, with the diagnosis of cp  The various methods of treatment have been discussed with the patient and family. After consideration of risks, benefits and other options for treatment, the patient has consented to  Procedure(s): Left Heart Cath and Coronary Angiography (N/A) as a surgical intervention .  The patient's history has been reviewed, patient examined, no change in status, stable for surgery.  I have reviewed the patient's chart and labs.  Questions were answered to the patient's satisfaction.     [name]

## 2016-11-18 ENCOUNTER — Encounter (HOSPITAL_COMMUNITY): Payer: Self-pay | Admitting: Cardiovascular Disease

## 2016-11-18 DIAGNOSIS — Z955 Presence of coronary angioplasty implant and graft: Secondary | ICD-10-CM | POA: Diagnosis not present

## 2016-11-18 DIAGNOSIS — K219 Gastro-esophageal reflux disease without esophagitis: Secondary | ICD-10-CM | POA: Diagnosis not present

## 2016-11-18 DIAGNOSIS — E039 Hypothyroidism, unspecified: Secondary | ICD-10-CM | POA: Diagnosis not present

## 2016-11-18 DIAGNOSIS — I25110 Atherosclerotic heart disease of native coronary artery with unstable angina pectoris: Secondary | ICD-10-CM | POA: Diagnosis not present

## 2016-11-18 DIAGNOSIS — E785 Hyperlipidemia, unspecified: Secondary | ICD-10-CM | POA: Diagnosis not present

## 2016-11-18 DIAGNOSIS — I25118 Atherosclerotic heart disease of native coronary artery with other forms of angina pectoris: Secondary | ICD-10-CM

## 2016-11-18 DIAGNOSIS — Z882 Allergy status to sulfonamides status: Secondary | ICD-10-CM | POA: Diagnosis not present

## 2016-11-18 DIAGNOSIS — I4510 Unspecified right bundle-branch block: Secondary | ICD-10-CM | POA: Diagnosis not present

## 2016-11-18 DIAGNOSIS — I2583 Coronary atherosclerosis due to lipid rich plaque: Secondary | ICD-10-CM | POA: Diagnosis not present

## 2016-11-18 DIAGNOSIS — E663 Overweight: Secondary | ICD-10-CM | POA: Diagnosis not present

## 2016-11-18 DIAGNOSIS — I10 Essential (primary) hypertension: Secondary | ICD-10-CM | POA: Diagnosis not present

## 2016-11-18 LAB — BASIC METABOLIC PANEL
Anion gap: 10 (ref 5–15)
BUN: 11 mg/dL (ref 6–20)
CO2: 22 mmol/L (ref 22–32)
Calcium: 9 mg/dL (ref 8.9–10.3)
Chloride: 106 mmol/L (ref 101–111)
Creatinine, Ser: 1.04 mg/dL (ref 0.61–1.24)
GFR calc Af Amer: >60 mL/min (ref >60)
GFR calc non Af Amer: >60 mL/min (ref >60)
Glucose, Bld: 164 mg/dL — ABNORMAL HIGH (ref 65–99)
Potassium: 3.6 mmol/L (ref 3.5–5.1)
Sodium: 138 mmol/L (ref 135–145)

## 2016-11-18 LAB — CBC
HCT: 43 % (ref 39.0–52.0)
Hemoglobin: 14 g/dL (ref 13.0–17.0)
MCH: 29.4 pg (ref 26.0–34.0)
MCHC: 32.6 g/dL (ref 30.0–36.0)
MCV: 90.3 fL (ref 78.0–100.0)
Platelets: 187 10*3/uL (ref 150–400)
RBC: 4.76 MIL/uL (ref 4.22–5.81)
RDW: 13.6 % (ref 11.5–15.5)
WBC: 6.8 10*3/uL (ref 4.0–10.5)

## 2016-11-18 MED ORDER — ANGIOPLASTY BOOK
Freq: Once | Status: DC
  Filled 2016-11-18: qty 1

## 2016-11-18 NOTE — Discharge Summary (Signed)
Discharge Summary    Patient ID: Dennis [name],  MRN: [id number], DOB/AGE: 72/26/1947 72 y.o.  Admit date: 11/17/2016 Discharge date: 11/18/2016  Primary Care Provider: [name] Primary Cardiologist: Dr. Gwenlyn Found  Discharge Diagnoses    Principal Problem:   Unstable angina [facility]) Active Problems:   Hypothyroidism   Hyperlipidemia   CAD- CFX RCA PCI '02, DES CFX 10/02/14   Essential hypertension   RBBB   Status post coronary artery stent placement   Allergies Allergies  Allergen Reactions  . Sulfur     rash    Diagnostic Studies/Procedures    Coronary Stent Intervention  Intravascular Pressure Wire/FFR Study  Left Heart Cath and Coronary Angiography  Conclusion     Ost RCA to Prox RCA lesion, 40 %stenosed.  Prox Cx to Mid Cx lesion, 0 %stenosed.  Mid Cx lesion, 70 %stenosed.  Post intervention, there is a 0% residual stenosis.  A stent was successfully placed.  The left ventricular systolic function is normal.  LV end diastolic pressure is normal.  The left ventricular ejection fraction is 55-65% by visual estimate.   IMPRESSION:Successful FFR guided mid AV groove circumflex PCI and drug-eluting stent of a 70% lesion just beyond the previously stented segment with an excellent angiographic result. Patient will be discharged the morning underwent topical therapy.      History of Present Illness     71 y.o. male with hx of CAD s/p PCI to RCA & circumflex by Dr. Glade Lloyd in 2012 and DES to circ 10/02/14, HLD, [place] amd GERD admitted for cath.   He was admitted to [facility] 09/30/14 with unstable angina. He underwent cardiac catheterization that day by Dr. Claiborne Billings revealing a high-grade mid circumflex lesion on a bend and was stented 2 days later by Dr. Julianne Handler with a Promus premiere drug eluting stent postdilated with a 4.5 mm balloon.   He has had 2 episodes of nitrate responsive angina on Mother's Day and again recently prior  to seen by Dr. Gwenlyn Found 11/08/16 similar to his prior symptoms. Based on this am going to recommend we proceed with outpatient cardiac cath.   Hospital Course     Consultants: None  1. CAD  s/p PCI to RCA & circumflex by Dr. Glade Lloyd in 2012 and DES to circ 10/02/14 - Cath showed 70% mid AV groove circumflex lesion. The FFR was 0.7 suggesting the lesion was physiologically significant. S/p successful synergy DES.  - Ambulating well without angina. Continue ASA, Plavix and BB.   2. HLD - He recently had lipid profile. Will bring during post hospital follow up. Hx of myalgia on Crestor. May consider adding another agent as outpatient.   3. HTN - Intermittently elevated here. 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 Advised to keep log.    The patient has been seen by Dr. Claiborne Billings today and deemed ready for discharge home. All follow-up appointments have been scheduled. Discharge medications are listed below.  _____________   Discharge Vitals Blood pressure (!) 165/93, pulse 81, temperature 97.4 F (36.3 C), temperature source Oral, resp. rate (!) 31, height 5\' 8"  (1.727 m), weight 229 lb 4.5 oz (104 kg), SpO2 95 %.  Filed Weights   11/17/16 1059 11/18/16 0243  Weight: 215 lb (97.5 kg) 229 lb 4.5 oz (104 kg)    Labs & Radiologic Studies     CBC  Recent Labs  11/18/16 0746  WBC 6.8  HGB 14.0  HCT 43.0  MCV 90.3  PLT 187  Basic Metabolic Panel  Recent Labs  11/18/16 0746  NA 138  K 3.6  CL 106  CO2 22  GLUCOSE 164*  BUN 11  CREATININE 1.04  CALCIUM 9.0    Dg Chest 2 View  Result Date: 11/07/2016 CLINICAL DATA:  Central chest pressure for 1 day EXAM: CHEST  2 VIEW COMPARISON:  09/29/2016 FINDINGS: The heart size and mediastinal contours are within normal limits. Both lungs are clear. There are degenerative changes of the spine. IMPRESSION: No active cardiopulmonary disease. Electronically Signed   By: Donavan Foil M.D.   On: 11/07/2016 21:14    Disposition   Pt is being  discharged home today in good condition.  Follow-up Plans & Appointments    Follow-up Information    [name], [place]. Go on 11/23/2016.   Specialties:  Cardiology, Radiology Why:  @1 :30 for post hospital  Contact information: [address] [place] [address] [phone]          Discharge Instructions    AMB Referral to Cardiac Rehabilitation - Phase II    Complete by:  As directed    Diagnosis:  Stable Angina   Amb Referral to Cardiac Rehabilitation    Complete by:  As directed    Diagnosis:  Coronary Stents   Diet - low sodium heart healthy    Complete by:  As directed    Discharge instructions    Complete by:  As directed    No driving for 48 hours. No lifting over 5 lbs for 1 week. No sexual activity for 1 week. You may return to work on after seen in clinic next week. Keep procedure site clean & dry. If you notice increased pain, swelling, bleeding or pus, call/return!  You may shower, but no soaking baths/hot tubs/pools for 1 week.   Increase activity slowly    Complete by:  As directed       Discharge Medications   Current Discharge Medication List    CONTINUE these medications which have NOT CHANGED   Details  ascorbic acid (VITAMIN C) 1000 MG tablet Take 1,000 mg by mouth daily.    aspirin 81 MG tablet Take 81 mg by mouth every evening.     cholecalciferol (VITAMIN D) 1000 UNITS tablet Take 1,000 Units by mouth daily.    clopidogrel (PLAVIX) 75 MG tablet Take 1 tablet (75 mg total) by mouth daily. Qty: 90 tablet, Refills: 3    fenofibrate 160 MG tablet Take 160 mg by mouth daily.    levothyroxine (SYNTHROID, LEVOTHROID) 112 MCG tablet Take 112 mcg by mouth daily.    magnesium oxide (MAG-OX) 400 MG tablet Take 400 mg by mouth daily.    metoprolol succinate (TOPROL-XL) 100 MG 24 hr tablet Take 100 mg by mouth daily.    NITROSTAT 0.4 MG SL tablet PLACE ONE TABLET UNDER THE TONGUE EVERY 5 MINUTES AS NEEDED FOR CHEST PAIN Qty: 25  tablet, Refills: 3    omega-3 acid ethyl esters (LOVAZA) 1 g capsule Take 1 g by mouth 2 (two) times daily.    ramipril (ALTACE) 10 MG capsule Take 10 mg by mouth every evening.     sildenafil (REVATIO) 20 MG tablet Take 1 to 5 tablets by mouth daily as needed for erectile dysfunction. Refills: 5          Outstanding Labs/Studies   None  Duration of Discharge Encounter   Greater than 30 minutes including physician time.  Signed, Ramani Riva PA-C 11/18/2016, 9:51 AM

## 2016-11-18 NOTE — Progress Notes (Signed)
Progress Note  Patient Name: Dennis [name] Date of Encounter: 11/18/2016  Primary Cardiologist: Dr. Gwenlyn Found  Subjective   Feeling well. No chest pain, sob or palpitations. Ambulating well with cardiac rehab.   Inpatient Medications    Scheduled Meds: . angioplasty book   Does not apply Once  . aspirin EC  81 mg Oral QPM  . cholecalciferol  1,000 Units Oral Daily  . clopidogrel  75 mg Oral Daily  . fenofibrate  160 mg Oral Daily  . levothyroxine  112 mcg Oral QAC breakfast  . magnesium oxide  400 mg Oral Daily  . metoprolol succinate  100 mg Oral Daily  . omega-3 acid ethyl esters  1 g Oral BID  . ramipril  10 mg Oral QPM  . sodium chloride flush  3 mL Intravenous Q12H  . ascorbic acid  1,000 mg Oral Daily   Continuous Infusions: . sodium chloride     PRN Meds: sodium chloride, acetaminophen, morphine injection, nitroGLYCERIN, ondansetron (ZOFRAN) IV, sodium chloride flush   Vital Signs    Vitals:   11/17/16 1923 11/17/16 2155 11/18/16 0243 11/18/16 0650  BP: 118/72 140/76 136/65 (!) 142/78  Pulse: 81 72 60 80  Resp: (!) 24 (!) 25 20   Temp: 97.9 F (36.6 C)  98.2 F (36.8 C)   TempSrc: Oral  Oral   SpO2: 97% 95% 97% 94%  Weight:   229 lb 4.5 oz (104 kg)   Height:        Intake/Output Summary (Last 24 hours) at 11/18/16 0814 Last data filed at 11/18/16 0526  Gross per 24 hour  Intake           468.75 ml  Output             1475 ml  Net         -1006.25 ml   Filed Weights   11/17/16 1059 11/18/16 0243  Weight: 215 lb (97.5 kg) 229 lb 4.5 oz (104 kg)    Telemetry    NSR- Personally Reviewed  ECG    NSR with chronic RBBB, diffuse TWI in inferior leads similar to prior EKG - Personally Reviewed  Physical Exam   GEN: No acute distress.   Neck: No JVD Cardiac: RRR, no murmurs, rubs, or gallops. R radial cath site without hematoma Respiratory: Clear to auscultation bilaterally. GI: Soft, nontender, non-distended  MS: No edema; No  deformity. Neuro:  Nonfocal  Psych: Normal affect   Labs  Pending labs this morning  Lipid Panel     Component Value Date/Time   CHOL 100 10/01/2014 0303   TRIG 338 (H) 10/01/2014 0303   HDL 21 (L) 10/01/2014 0303   CHOLHDL 4.8 10/01/2014 0303   VLDL 68 (H) 10/01/2014 0303   LDLCALC 11 10/01/2014 0303    Radiology    No results found.  Cardiac Studies   Coronary Stent Intervention  Intravascular Pressure Wire/FFR Study  Left Heart Cath and Coronary Angiography  Conclusion     Ost RCA to Prox RCA lesion, 40 %stenosed.  Prox Cx to Mid Cx lesion, 0 %stenosed.  Mid Cx lesion, 70 %stenosed.  Post intervention, there is a 0% residual stenosis.  A stent was successfully placed.  The left ventricular systolic function is normal.  LV end diastolic pressure is normal.  The left ventricular ejection fraction is 55-65% by visual estimate.    IMPRESSION: Successful FFR guided mid AV groove circumflex PCI and drug-eluting stent of a 70% lesion just  beyond the previously stented segment with an excellent angiographic result. Patient will be discharged the morning underwent topical therapy.     Patient Profile     71 y.o. male with hx of CAD s/p PCI to RCA & circumflex by Dr. Glade Lloyd in 2012 and DES to circ 10/02/14, HLD, [place] amd GERD admitted for cath.   He was admitted to [facility] 09/30/14 with unstable angina. He underwent cardiac catheterization that day by Dr. Claiborne Billings revealing a high-grade mid circumflex lesion on a bend and was stented 2 days later by Dr. Julianne Handler with a Promus premiere drug eluting stent postdilated with a 4.5 mm balloon.   He has had 2 episodes of nitrate responsive angina on Mother's Day and again recently prior to seen by Dr. Gwenlyn Found 11/08/16 similar to his prior symptoms. Based on this am going to recommend we proceed with outpatient cardiac cath.   Assessment & Plan    1. CAD  s/p PCI to RCA & circumflex by Dr. Glade Lloyd in 2012 and  DES to circ 10/02/14 - Cath showed 70% mid AV groove circumflex lesion. The FFR was 0.7 suggesting the lesion was physiologically significant. S/p successful synergy DES.  - Ambulating well without angina. Continue ASA, Plavix and BB.   2. HLD - he recently had lipid profile. Will bring during post hospital follow up. Hx of myalgia on Crestor. May consider adding another agent as outpatient.   3. HTN - intermittently elevated here. 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 Advised to keep log.   Jarrett Soho, PA  11/18/2016, 8:14 AM     Patient seen and examined. Agree with assessment and plan. Feels well; angios reviewed.  R Radial site stable. DC today; f/u Dr. Gwenlyn Found.   Troy Sine, MD, [facility] 11/18/2016 9:33 AM

## 2016-11-18 NOTE — Progress Notes (Signed)
CARDIAC REHAB PHASE I   PRE:  Rate/Rhythm: 82 SR  BP:  Sitting: 165/93    MODE:  Ambulation: 600 ft   POST:  Rate/Rhythm: 90 SR c/ PVCs  BP:  Sitting: 151/88    Pt last seen by cardiac rehab in 2016. Pt ambulated 600 ft on RA, handheld assist, steady gait, tolerated well with no complaints. Completed PCI/stent education.  Reviewed risk factors, PCI book, anti-platelet therapy, stent card, activity restrictions, ntg, exercise, heart healthy diet and phase 2 cardiac rehab. Pt verbalized understanding. Pt agrees to phase 2 cardiac rehab referral, will send to [facility]. Pt up ad lib in room, awaiting discharge.   [id number] Lenna Sciara, RN, BSN 11/18/2016 8:32 AM

## 2016-11-20 LAB — POCT ACTIVATED CLOTTING TIME: Activated Clotting Time: 466 seconds

## 2016-11-22 ENCOUNTER — Telehealth (HOSPITAL_COMMUNITY): Payer: Self-pay

## 2016-11-22 NOTE — Telephone Encounter (Signed)
Patient insurance is active and benefits verified. Patient has [employer] - $45.00 co-payment, no deductible, out of pocket max $4500/$165.00 has been met, no co-insurance, no pre-authorization and no limit on visit. Passport/reference [phone].

## 2016-11-23 ENCOUNTER — Ambulatory Visit (INDEPENDENT_AMBULATORY_CARE_PROVIDER_SITE_OTHER): Payer: Medicare HMO | Admitting: Cardiology

## 2016-11-23 ENCOUNTER — Telehealth (HOSPITAL_COMMUNITY): Payer: Self-pay

## 2016-11-23 ENCOUNTER — Encounter: Payer: Self-pay | Admitting: Cardiology

## 2016-11-23 DIAGNOSIS — I4510 Unspecified right bundle-branch block: Secondary | ICD-10-CM | POA: Diagnosis not present

## 2016-11-23 DIAGNOSIS — I10 Essential (primary) hypertension: Secondary | ICD-10-CM | POA: Diagnosis not present

## 2016-11-23 DIAGNOSIS — Z9861 Coronary angioplasty status: Secondary | ICD-10-CM

## 2016-11-23 DIAGNOSIS — E785 Hyperlipidemia, unspecified: Secondary | ICD-10-CM | POA: Diagnosis not present

## 2016-11-23 DIAGNOSIS — I2510 Atherosclerotic heart disease of native coronary artery without angina pectoris: Secondary | ICD-10-CM | POA: Diagnosis not present

## 2016-11-23 NOTE — Patient Instructions (Signed)
Medication Instructions:   No change. We will refer you to lipid clinic here at the [facility] office for discussion of further options for cholesterol control (PCSK9 inhibitors)  Labwork:   None. We will contact Dr. Noland Fordyce office for copies of your recent labwork.  Testing/Procedures:  None ordered.  Follow-Up:  In lipid clinic in next 2-3 weeks and with Dr. Gwenlyn Found in 3 months.   If you need a refill on your cardiac medications before your next appointment, please call your pharmacy.

## 2016-11-23 NOTE — Progress Notes (Signed)
11/23/2016 Dennis [name]   24-Apr-1946  [id number]  Primary Physician [name], MD Primary Cardiologist: Dr Gwenlyn Found  HPI:  71 y/o male with a history of CAD, s/p MI and RCA stent in 2002 by Dr Glade Lloyd. He then had a CFX DES placed in April 2016. He recently saw Dr Gwenlyn Found and reported a history of SSCP "tightness" c/w with his prior angina and it was elected to proceed to cath. This was done 11/17/16 and reveled a new mCFX lesion of 70% treated with a DES. The RCA had a 40% narrowing and the prior pCFX stent was patent. His LVF was normal. He is in the office today for follow up. He has had no further chest pain. He has tried Lipitor and Crestor in the past and stopped both because of myalgias. He tells me a recent lipid panel showed his LDL to be "70" off statin Rx.    Current Outpatient Prescriptions  Medication Sig Dispense Refill  . ascorbic acid (VITAMIN C) 1000 MG tablet Take 1,000 mg by mouth daily.    Marland Kitchen aspirin 81 MG tablet Take 81 mg by mouth every evening.     . cholecalciferol (VITAMIN D) 1000 UNITS tablet Take 1,000 Units by mouth daily.    . clopidogrel (PLAVIX) 75 MG tablet Take 1 tablet (75 mg total) by mouth daily. 90 tablet 3  . fenofibrate 160 MG tablet Take 160 mg by mouth daily.    Marland Kitchen levothyroxine (SYNTHROID, LEVOTHROID) 112 MCG tablet Take 112 mcg by mouth daily.    . magnesium oxide (MAG-OX) 400 MG tablet Take 400 mg by mouth daily.    . metoprolol succinate (TOPROL-XL) 100 MG 24 hr tablet Take 100 mg by mouth daily.    Marland Kitchen NITROSTAT 0.4 MG SL tablet PLACE ONE TABLET UNDER THE TONGUE EVERY 5 MINUTES AS NEEDED FOR CHEST PAIN 25 tablet 3  . omega-3 acid ethyl esters (LOVAZA) 1 g capsule Take 1 g by mouth 2 (two) times daily.    . ramipril (ALTACE) 10 MG capsule Take 10 mg by mouth every evening.     . sildenafil (REVATIO) 20 MG tablet Take 1 to 5 tablets by mouth daily as needed for erectile dysfunction.  5   No current facility-administered medications for  this visit.     Allergies  Allergen Reactions  . Sulfur     rash    Past Medical History:  Diagnosis Date  . Anginal pain ([place])   . CAD (coronary artery disease)    a. 2002 Stent RCA & circumflex EF40-50%   . GERD (gastroesophageal reflux disease)   . Hyperlipidemia   . Hypertension   . Hypothyroidism   . Myocardial infarction ([place]) 03/2001  . Squamous cell carcinoma of scalp    "some burned, some cut off" (11/17/2016)  . Thyroid nodule, hot    s/p radioactive iodine, now hypothyroidism    Social History   Social History  . Marital status: Married    Spouse name: N/A  . Number of children: N/A  . Years of education: N/A   Occupational History  . Not on file.   Social History Main Topics  . Smoking status: Former Smoker    Packs/day: 1.00    Years: 40.00    Types: Cigarettes    Quit date: 03/06/2001  . Smokeless tobacco: Never Used  . Alcohol use No  . Drug use: No  . Sexual activity: Not on file   Other Topics Concern  .  Not on file   Social History Narrative  . No narrative on file     No family history on file.   Review of Systems: General: negative for chills, fever, night sweats or weight changes.  Cardiovascular: negative for chest pain, dyspnea on exertion, edema, orthopnea, palpitations, paroxysmal nocturnal dyspnea or shortness of breath Dermatological: negative for rash Respiratory: negative for cough or wheezing Urologic: negative for hematuria Abdominal: negative for nausea, vomiting, diarrhea, bright red blood per rectum, melena, or hematemesis Neurologic: negative for visual changes, syncope, or dizziness All other systems reviewed and are otherwise negative except as noted above.    Blood pressure 140/82, pulse 66, height 5\' 8"  (1.727 m), weight 220 lb 12.8 oz (100.2 kg).  General appearance: alert, cooperative, no distress and mildly obese Neck: no carotid bruit and no JVD Lungs: clear to auscultation bilaterally Heart: regular  rate and rhythm Extremities: extremities normal, atraumatic, no cyanosis or edema and Rt radial site without hematoma Skin: Skin color, texture, turgor normal. No rashes or lesions Neurologic: Grossly normal  EKG NSR, RBBB, LAD  ASSESSMENT AND PLAN:   Essential hypertension B/P 124/82 by me  Dyslipidemia Pt says he had leg cramps on Lipitor- changed to Crestor which caused the same problem. He had recent lipid panel with PCP- "LDL 70" off statin Rx.  RBBB Chronic  CAD S/P multiple PCIs MI- 2002 Stent RCA  April 2016- DES to CFX June 2018- new mCFX DES    PLAN  Despite having an LDL of 70 the patient has had recurrent cardiac event.  I will refer him to our pharmacist for consideration of a PCSK9.  F/U Dr Gwenlyn Found 3 months.   Kerin Ransom PA-C 11/23/2016 2:32 PM

## 2016-11-23 NOTE — Assessment & Plan Note (Signed)
B/P 124/82 by me

## 2016-11-23 NOTE — Telephone Encounter (Signed)
I called and left message on patient voicemail to call office about scheduling for cardiac rehab. I left office contact information on patient voicemail to return call.  ° °

## 2016-11-23 NOTE — Assessment & Plan Note (Signed)
MI- 2002 Stent RCA  April 2016- DES to CFX June 2018- new mCFX DES

## 2016-11-23 NOTE — Assessment & Plan Note (Signed)
Chronic. 

## 2016-11-23 NOTE — Assessment & Plan Note (Signed)
Pt says he had leg cramps on Lipitor- changed to Crestor which caused the same problem. He had recent lipid panel with PCP- "LDL 70" off statin Rx.

## 2016-11-30 ENCOUNTER — Telehealth (HOSPITAL_COMMUNITY): Payer: Self-pay

## 2016-11-30 DIAGNOSIS — Z4802 Encounter for removal of sutures: Secondary | ICD-10-CM | POA: Diagnosis not present

## 2016-11-30 NOTE — Telephone Encounter (Signed)
Per message I received, patient returned call and declined cardiac rehab. Referral canceled.

## 2016-11-30 NOTE — Telephone Encounter (Signed)
I called and left message on patient voicemail to call office about scheduling for cardiac rehab. I left office contact information on patient voicemail to return call.  ° °

## 2016-12-01 ENCOUNTER — Telehealth: Payer: Self-pay | Admitting: Pharmacist Clinician (PhC)/ Clinical Pharmacy Specialist

## 2016-12-01 NOTE — Telephone Encounter (Signed)
Reviewed labs  - LDL was 98.  Patient believed it was in the 70's.  Not interested in pursuing PCSK-9 therapy at this time

## 2017-02-17 ENCOUNTER — Ambulatory Visit (INDEPENDENT_AMBULATORY_CARE_PROVIDER_SITE_OTHER): Payer: Medicare HMO | Admitting: Cardiovascular Disease

## 2017-02-17 ENCOUNTER — Encounter: Payer: Self-pay | Admitting: Cardiovascular Disease

## 2017-02-17 DIAGNOSIS — E785 Hyperlipidemia, unspecified: Secondary | ICD-10-CM | POA: Diagnosis not present

## 2017-02-17 DIAGNOSIS — Z9861 Coronary angioplasty status: Secondary | ICD-10-CM

## 2017-02-17 DIAGNOSIS — I10 Essential (primary) hypertension: Secondary | ICD-10-CM | POA: Diagnosis not present

## 2017-02-17 DIAGNOSIS — I2510 Atherosclerotic heart disease of native coronary artery without angina pectoris: Secondary | ICD-10-CM | POA: Diagnosis not present

## 2017-02-17 NOTE — Assessment & Plan Note (Signed)
History of essential hypertension blood pressure measured today 150/82. Blood pressures at home range in the 135.45 range. He is on metoprolol and enalapril. Continue current meds at current dosing

## 2017-02-17 NOTE — Assessment & Plan Note (Signed)
History of hyperlipidemia not on statin therapy but all he is on fenofibrate followed by his PCP.

## 2017-02-17 NOTE — Assessment & Plan Note (Signed)
History of CAD status post multiple PCI's by Dr. Glade Lloyd back in 2002 of his RCA and circumflex. He underwent cardiac catheterization by Dr. Claiborne Billings April 2016 revealing high-grade mid circumflex disease on a bend which was stented 2 days later by Dr. Angelena Form with a Promus premiere drug-eluting stent post dilating the 4.5 mm. Because of recurrent chest pain I catheterized him 11/17/16 revealed a widely patent proximal circumflex stent with 70% lesion beyond this in the AV groove. I performed FFR suggesting this was largely significant and stent of this lesion with a drug-eluting stent (3 mm x 20 mm long synergy drug-eluting stent postdilated to 3.25 mm). He has had no recurrent symptoms. He remains on dual antibiotic therapy including aspirin and Plavix.

## 2017-02-17 NOTE — Patient Instructions (Signed)
Medication Instructions:  Your physician recommends that you continue on your current medications as directed. Please refer to the Current Medication list given to you today.  Follow-Up: Your physician wants you to follow-up in: 1 YEAR with Dr. Gwenlyn Found. You will receive a reminder letter in the mail two months in advance. If you don't receive a letter, please call our office to schedule the follow-up appointment.   Any Other Special Instructions Will Be Listed Below (If Applicable).     If you need a refill on your cardiac medications before your next appointment, please call your pharmacy.

## 2017-02-17 NOTE — Progress Notes (Signed)
02/17/2017 SONNIE PAWLOSKI   Oct 08, 1945  [id number]  Primary Physician [name], MD Primary Cardiologist: Lorretta Harp MD [name], [place]  HPI:  SEVERIN BOU is a 71 y.o. 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 He is a patient of Dr. Revonda Humphrey. I last saw him in the office 11/08/16.Marland Kitchen History of hyperlipidemia and CAD status post RCA and circumflex intervention by Dr. Glade Lloyd back in 2002. He was admitted to [facility] 09/30/14 with unstable angina. He underwent cardiac catheterization that day by Dr. Claiborne Billings revealing a high-grade mid circumflex lesion on a bend and was stented 2 days later by Dr. Julianne Handler with a Promus premiere drug eluting stent postdilated with a 4.5 mm balloon. When I saw him 3 months ago he had experienced several episodes of nitrate responsive chest pain. I ultimately decided to perform outpatient cardiac catheterization. The right radial approach on 11/17/16 revealing a widely patent proximal circumflex stent, widely patent RCA with 70% lesion in the AV groove circumflex beyond the previously stented segment. I performed FFR which was 0.7 suggesting this was physiologically significant and stented that with a 3 mm x 12 mm long synergy drug-eluting stent post dilating up to 3.25 mm. This pain has since resolved. He remains on dual antiplatelet therapy.   Current Meds  Medication Sig  . ascorbic acid (VITAMIN C) 1000 MG tablet Take 1,000 mg by mouth daily.  Marland Kitchen aspirin 81 MG tablet Take 81 mg by mouth every evening.   . cholecalciferol (VITAMIN D) 1000 UNITS tablet Take 2,000 Units by mouth daily.   . clopidogrel (PLAVIX) 75 MG tablet Take 1 tablet (75 mg total) by mouth daily.  . fenofibrate 160 MG tablet Take 160 mg by mouth daily.  Marland Kitchen levothyroxine (SYNTHROID, LEVOTHROID) 112 MCG tablet Take 112 mcg by mouth daily.  . magnesium oxide (MAG-OX) 400 MG  tablet Take 400 mg by mouth daily.  . metoprolol succinate (TOPROL-XL) 100 MG 24 hr tablet Take 100 mg by mouth daily.  Marland Kitchen NITROSTAT 0.4 MG SL tablet PLACE ONE TABLET UNDER THE TONGUE EVERY 5 MINUTES AS NEEDED FOR CHEST PAIN  . omega-3 acid ethyl esters (LOVAZA) 1 g capsule Take 1 g by mouth 2 (two) times daily.  . ramipril (ALTACE) 10 MG capsule Take 10 mg by mouth every evening.   . sildenafil (REVATIO) 20 MG tablet Take 1 to 5 tablets by mouth daily as needed for erectile dysfunction.     Allergies  Allergen Reactions  . Sulfur     rash    Social History   Social History  . Marital status: Married    Spouse name: N/A  . Number of children: N/A  . Years of education: N/A   Occupational History  . Not on file.   Social History Main Topics  . Smoking status: Former Smoker    Packs/day: 1.00    Years: 40.00    Types: Cigarettes    Quit date: 03/06/2001  . Smokeless tobacco: Never Used  . Alcohol use No  . Drug use: No  . Sexual activity: Not on file   Other Topics Concern  . Not on file   Social History Narrative  . No narrative on file     Review of Systems: General: negative for chills, fever, night sweats or weight changes.  Cardiovascular: negative for chest pain, dyspnea on exertion, edema, orthopnea, palpitations, paroxysmal nocturnal dyspnea or  shortness of breath Dermatological: negative for rash Respiratory: negative for cough or wheezing Urologic: negative for hematuria Abdominal: negative for nausea, vomiting, diarrhea, bright red blood per rectum, melena, or hematemesis Neurologic: negative for visual changes, syncope, or dizziness All other systems reviewed and are otherwise negative except as noted above.    Blood pressure (!) 150/82, pulse 64, height 5\' 8"  (1.727 m), weight 218 lb (98.9 kg).  General appearance: alert and no distress Neck: no adenopathy, no carotid bruit, no JVD, supple, symmetrical, trachea midline and thyroid not enlarged,  symmetric, no tenderness/mass/nodules Lungs: clear to auscultation bilaterally Heart: regular rate and rhythm, S1, S2 normal, no murmur, click, rub or gallop Extremities: extremities normal, atraumatic, no cyanosis or edema Pulses: 2+ and symmetric Skin: Skin color, texture, turgor normal. No rashes or lesions Neurologic: Alert and oriented X 3, normal strength and tone. Normal symmetric reflexes. Normal coordination and gait  EKG not performed today.  ASSESSMENT AND PLAN:   Dyslipidemia History of hyperlipidemia not on statin therapy but all he is on fenofibrate followed by his PCP.  CAD S/P multiple PCIs History of CAD status post multiple PCI's by Dr. Glade Lloyd back in 2002 of his RCA and circumflex. He underwent cardiac catheterization by Dr. Claiborne Billings April 2016 revealing high-grade mid circumflex disease on a bend which was stented 2 days later by Dr. Angelena Form with a Promus premiere drug-eluting stent post dilating the 4.5 mm. Because of recurrent chest pain I catheterized him 11/17/16 revealed a widely patent proximal circumflex stent with 70% lesion beyond this in the AV groove. I performed FFR suggesting this was largely significant and stent of this lesion with a drug-eluting stent (3 mm x 20 mm long synergy drug-eluting stent postdilated to 3.25 mm). He has had no recurrent symptoms. He remains on dual antibiotic therapy including aspirin and Plavix.  Essential hypertension History of essential hypertension blood pressure measured today 150/82. Blood pressures at home range in the 135.45 range. He is on metoprolol and enalapril. Continue current meds at current dosing      Lorretta Harp MD [facility], [facility] 02/17/2017 9:50 AM

## 2017-03-14 ENCOUNTER — Telehealth: Payer: Self-pay | Admitting: Cardiovascular Disease

## 2017-03-14 NOTE — Telephone Encounter (Signed)
lm2cb vm (DPR)

## 2017-03-14 NOTE — Telephone Encounter (Signed)
New Message  Pt call requesting to speak with RN. Pt would like to get a New prescription for medication Crestor 20mg . Pt would like to know if there is an alternative medication. Please call back to discuss

## 2017-03-14 NOTE — Telephone Encounter (Signed)
Spoke with pt he states that his PCP is out of the office on medical leave and was wanting to know if Dr Gwenlyn Found could prescribe his Crestor that he stopped a while ago. Pt states that he will call PCP and find out why PCP is out and let us know next week. He would like me to call him on Monday or Tuesday.

## 2017-03-20 NOTE — Telephone Encounter (Signed)
Lmvm-rosuvastatin 20mg  PCP?

## 2017-03-21 NOTE — Telephone Encounter (Signed)
Will await pt to call back

## 2017-04-05 DIAGNOSIS — Z23 Encounter for immunization: Secondary | ICD-10-CM | POA: Diagnosis not present

## 2017-04-18 ENCOUNTER — Other Ambulatory Visit: Payer: Self-pay | Admitting: Cardiovascular Disease

## 2017-04-18 NOTE — Telephone Encounter (Signed)
Rx(s) sent to pharmacy electronically.  

## 2017-06-27 DIAGNOSIS — R69 Illness, unspecified: Secondary | ICD-10-CM | POA: Diagnosis not present

## 2017-07-25 DIAGNOSIS — Z85828 Personal history of other malignant neoplasm of skin: Secondary | ICD-10-CM | POA: Diagnosis not present

## 2017-07-25 DIAGNOSIS — L821 Other seborrheic keratosis: Secondary | ICD-10-CM | POA: Diagnosis not present

## 2017-07-25 DIAGNOSIS — L570 Actinic keratosis: Secondary | ICD-10-CM | POA: Diagnosis not present

## 2017-10-19 DIAGNOSIS — E039 Hypothyroidism, unspecified: Secondary | ICD-10-CM | POA: Diagnosis not present

## 2017-10-19 DIAGNOSIS — E785 Hyperlipidemia, unspecified: Secondary | ICD-10-CM | POA: Diagnosis not present

## 2017-10-19 DIAGNOSIS — Z125 Encounter for screening for malignant neoplasm of prostate: Secondary | ICD-10-CM | POA: Diagnosis not present

## 2017-10-19 DIAGNOSIS — R7309 Other abnormal glucose: Secondary | ICD-10-CM | POA: Diagnosis not present

## 2017-10-19 DIAGNOSIS — E559 Vitamin D deficiency, unspecified: Secondary | ICD-10-CM | POA: Diagnosis not present

## 2017-10-27 DIAGNOSIS — I209 Angina pectoris, unspecified: Secondary | ICD-10-CM | POA: Diagnosis not present

## 2017-10-27 DIAGNOSIS — E039 Hypothyroidism, unspecified: Secondary | ICD-10-CM | POA: Diagnosis not present

## 2017-10-27 DIAGNOSIS — E785 Hyperlipidemia, unspecified: Secondary | ICD-10-CM | POA: Diagnosis not present

## 2017-10-27 DIAGNOSIS — R7309 Other abnormal glucose: Secondary | ICD-10-CM | POA: Diagnosis not present

## 2017-10-27 DIAGNOSIS — R972 Elevated prostate specific antigen [PSA]: Secondary | ICD-10-CM | POA: Diagnosis not present

## 2017-10-27 DIAGNOSIS — N182 Chronic kidney disease, stage 2 (mild): Secondary | ICD-10-CM | POA: Diagnosis not present

## 2017-10-27 DIAGNOSIS — I2510 Atherosclerotic heart disease of native coronary artery without angina pectoris: Secondary | ICD-10-CM | POA: Diagnosis not present

## 2017-10-27 DIAGNOSIS — Z0000 Encounter for general adult medical examination without abnormal findings: Secondary | ICD-10-CM | POA: Diagnosis not present

## 2017-10-27 DIAGNOSIS — I129 Hypertensive chronic kidney disease with stage 1 through stage 4 chronic kidney disease, or unspecified chronic kidney disease: Secondary | ICD-10-CM | POA: Diagnosis not present

## 2017-11-25 IMAGING — CR DG CHEST 2V
2 series · 2 of 2 positions shown · non-contrast
Comparison: 09/29/2016

CLINICAL DATA: Central chest pressure for 1 day

EXAM:
CHEST  2 VIEW

[chest pa]
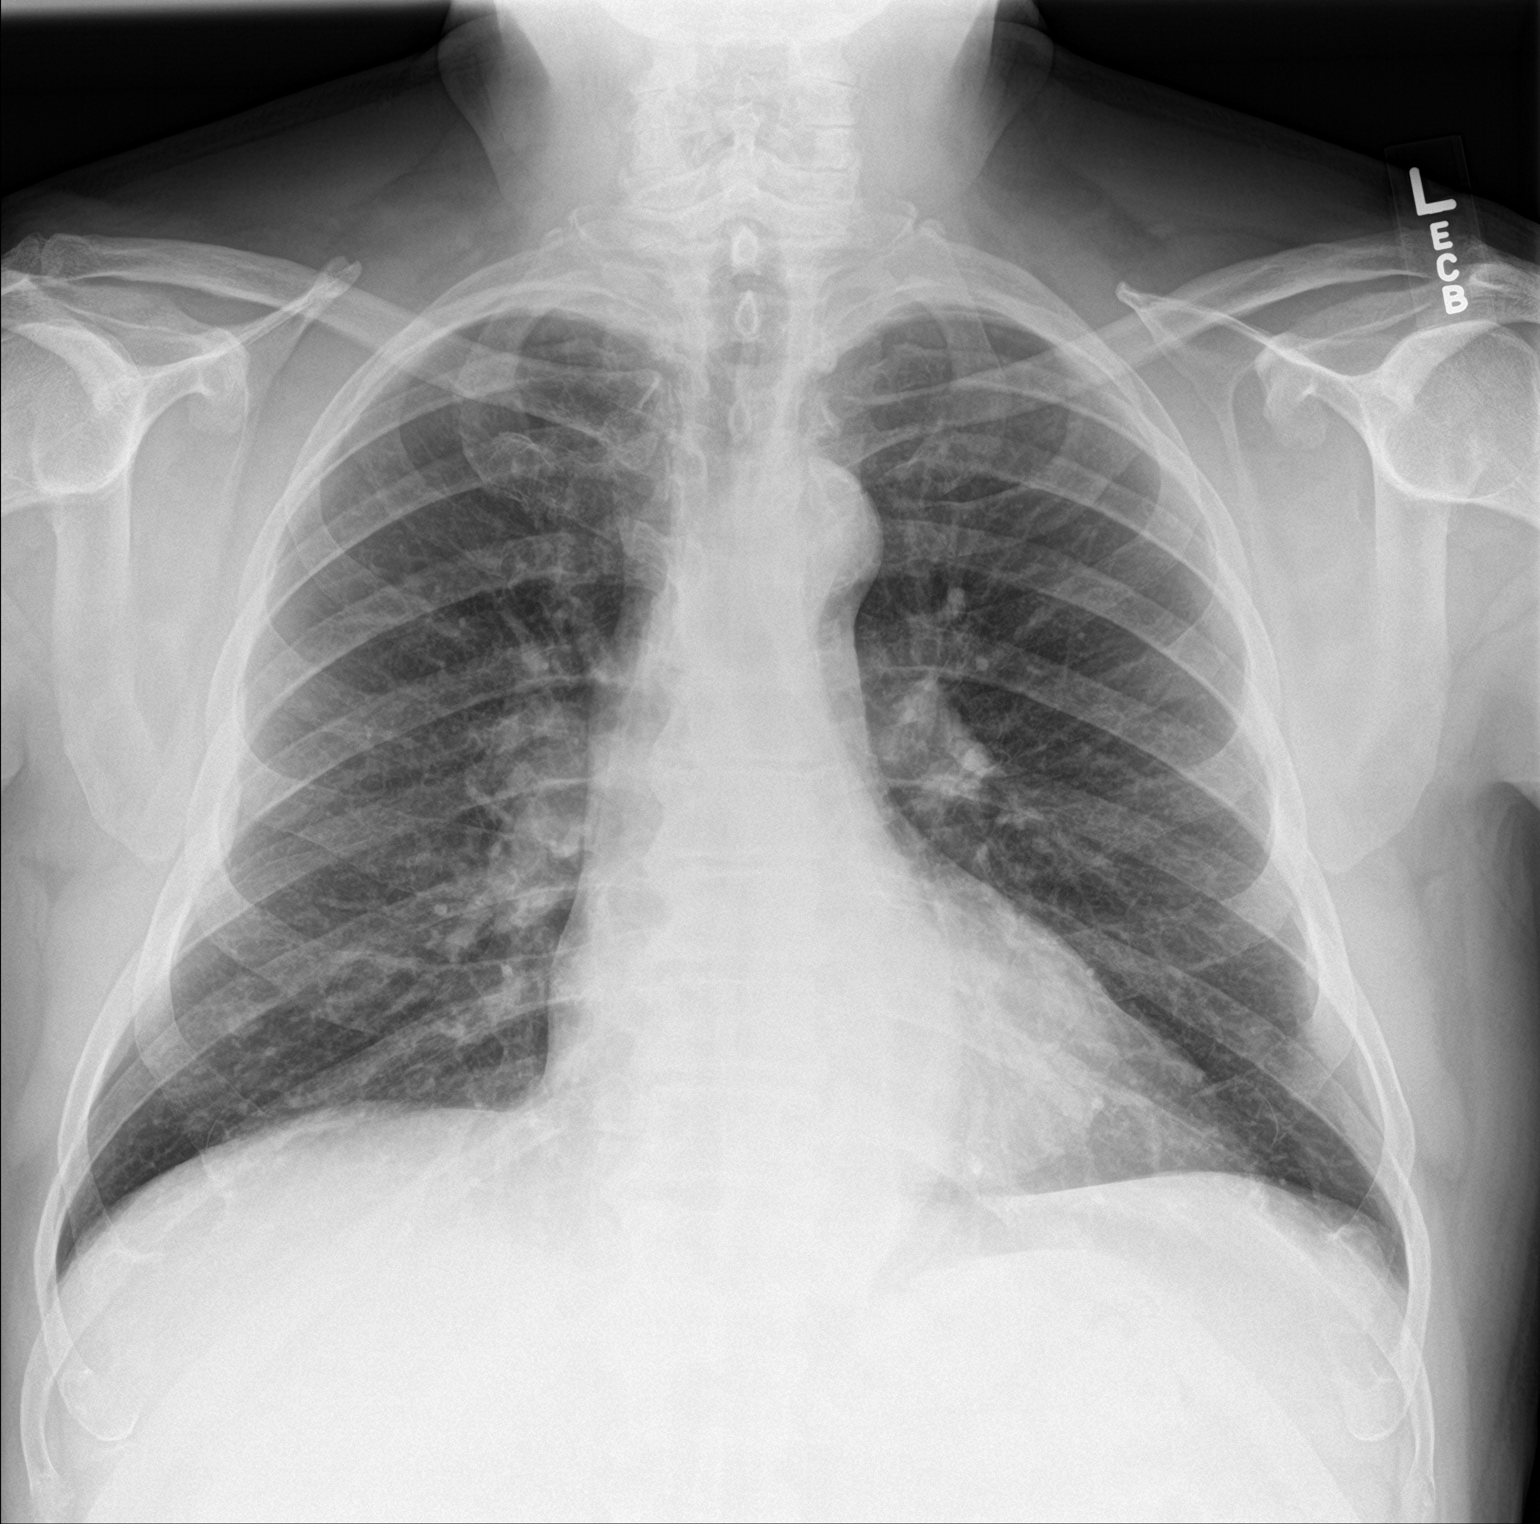

[chest lat]
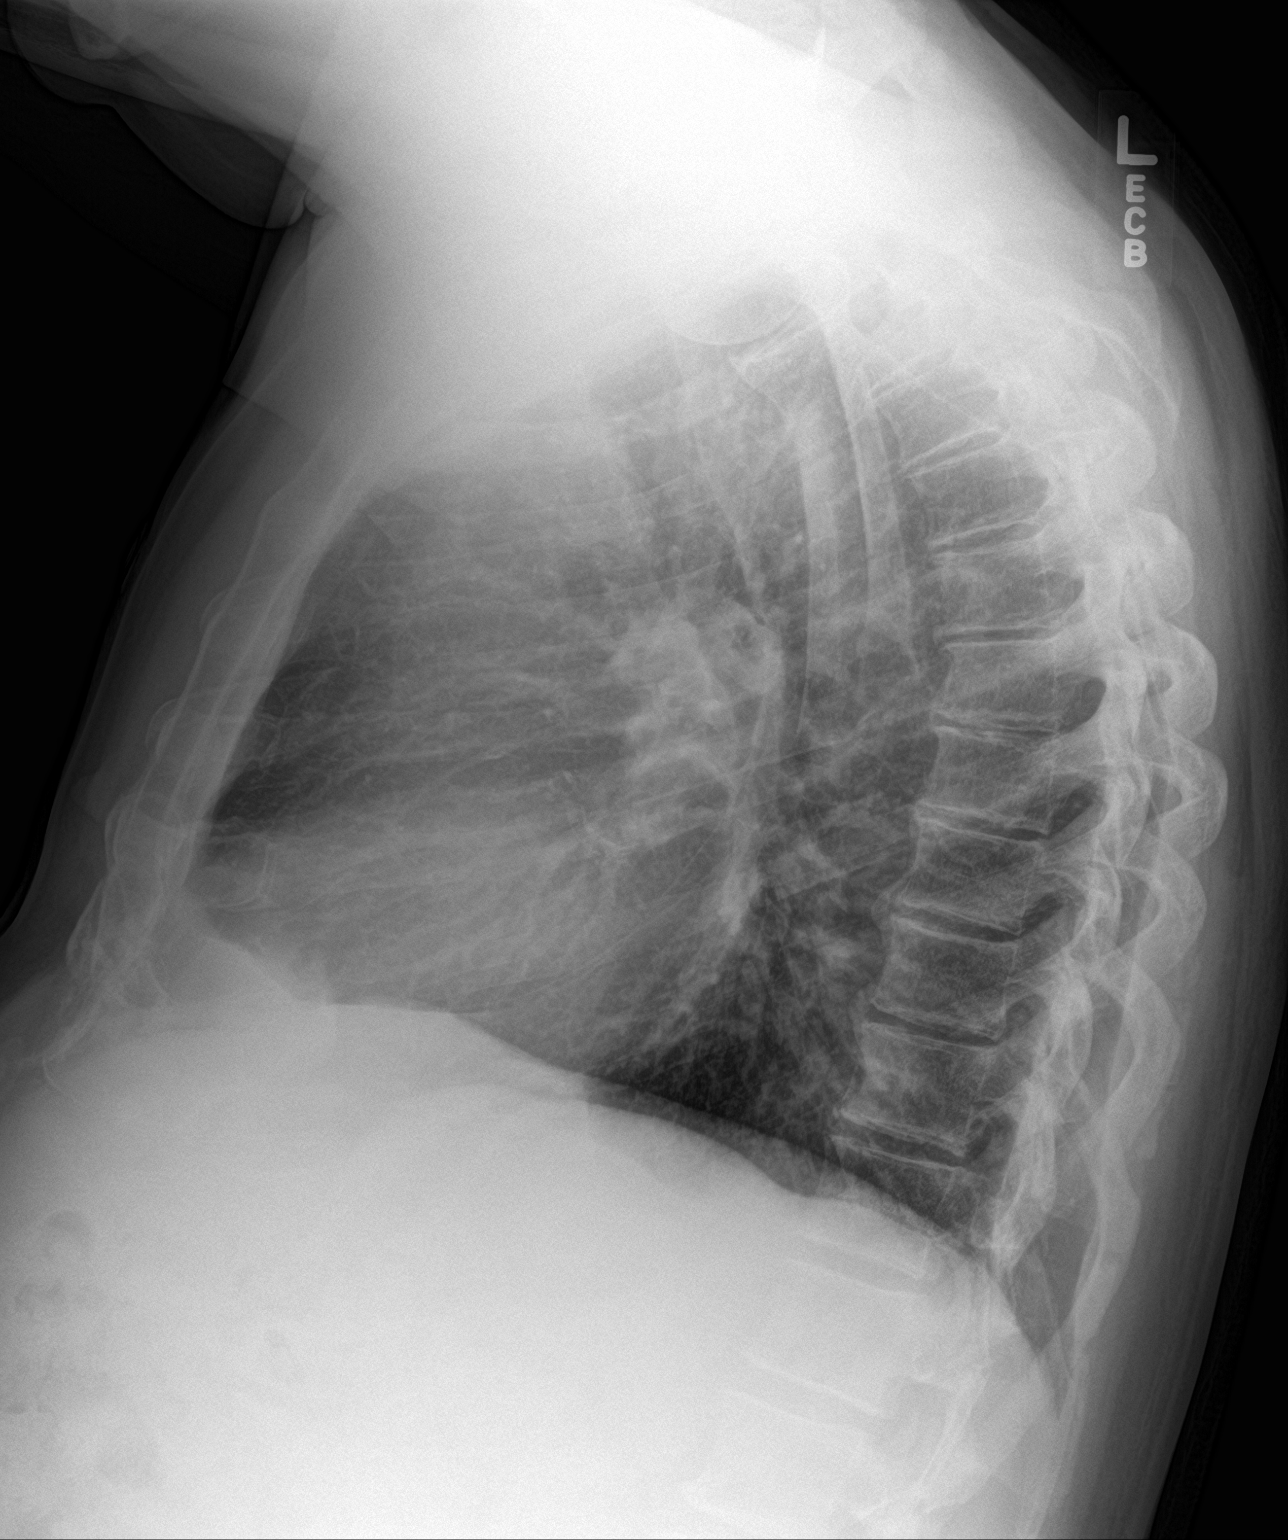

[2 of 2 positions shown; findings below may reference images not displayed]

FINDINGS: The heart size and mediastinal contours are within normal limits.
Both lungs are clear. There are degenerative changes of the spine.
IMPRESSION: No active cardiopulmonary disease.

## 2017-11-27 DIAGNOSIS — T148XXA Other injury of unspecified body region, initial encounter: Secondary | ICD-10-CM | POA: Diagnosis not present

## 2017-11-27 DIAGNOSIS — S40822A Blister (nonthermal) of left upper arm, initial encounter: Secondary | ICD-10-CM | POA: Diagnosis not present

## 2018-01-23 DIAGNOSIS — L570 Actinic keratosis: Secondary | ICD-10-CM | POA: Diagnosis not present

## 2018-01-23 DIAGNOSIS — L821 Other seborrheic keratosis: Secondary | ICD-10-CM | POA: Diagnosis not present

## 2018-01-23 DIAGNOSIS — Z85828 Personal history of other malignant neoplasm of skin: Secondary | ICD-10-CM | POA: Diagnosis not present

## 2018-02-20 ENCOUNTER — Ambulatory Visit: Payer: Medicare HMO | Admitting: Cardiovascular Disease

## 2018-02-20 ENCOUNTER — Encounter: Payer: Self-pay | Admitting: Cardiovascular Disease

## 2018-02-20 VITALS — BP 152/90 | HR 70 | Ht 68.0 in | Wt 214.0 lb

## 2018-02-20 DIAGNOSIS — E785 Hyperlipidemia, unspecified: Secondary | ICD-10-CM

## 2018-02-20 DIAGNOSIS — I10 Essential (primary) hypertension: Secondary | ICD-10-CM

## 2018-02-20 DIAGNOSIS — I4510 Unspecified right bundle-branch block: Secondary | ICD-10-CM

## 2018-02-20 DIAGNOSIS — I2510 Atherosclerotic heart disease of native coronary artery without angina pectoris: Secondary | ICD-10-CM

## 2018-02-20 DIAGNOSIS — Z9861 Coronary angioplasty status: Secondary | ICD-10-CM

## 2018-02-20 NOTE — Assessment & Plan Note (Signed)
History of hyperlipidemia blood pressure measured today 152/90.  He is on metoprolol and ramipril.  Continue current meds at current dosing.

## 2018-02-20 NOTE — Assessment & Plan Note (Signed)
History of dyslipidemia with recent LDL of 97 checked 10/19/2017.  He was begun on pravastatin 20 mg by his PCP.  This will be rechecked by him next week.

## 2018-02-20 NOTE — Assessment & Plan Note (Signed)
Chronic. 

## 2018-02-20 NOTE — Assessment & Plan Note (Signed)
History of CAD status post circumflex intervention by Dr. Glade Lloyd back in 2002.  Dr. Angelena Form placed a stent in April 2016 after Dr. Claiborne Billings performed a diagnostic cath.  Because of chest pain I restudied him via the right radial approach 11/17/2016 revealing a widely patent proximal circumflex stent with a distal lesion that was positive by FFR which I tented with a 3 mm x 12 mm long Synergy drug-eluting stent post dilating up to 3.25 mm.  He said no recurrent chest pain since I saw him a year ago.  He remains on dual antiplatelet therapy.

## 2018-02-20 NOTE — Progress Notes (Signed)
02/20/2018 Dennis [name]   06/17/1945  [id number]  Primary Physician Dennis Sheller, MD Primary Cardiologist: Dennis Harp MD Dennis [name], [place]  HPI:  Dennis [name] is a 72 y.o.   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 He is a patient of Dennis [name]. I last saw him in the office  02/17/2017.Marland Kitchen History of hyperlipidemia and CAD status post RCA and circumflex intervention by Dennis [name] back in 2002. He was admitted to [facility] 09/30/14 with unstable angina. He underwent cardiac catheterization that day by Dennis [name] revealing a high-grade mid circumflex lesion on a bend and was stented 2 days later by Dennis [name] with a Promus premiere drug eluting stent postdilated with a 4.5 mm balloon. When I saw him 3 months ago he had experienced several episodes of nitrate responsive chest pain. I ultimately decided to perform outpatient cardiac catheterization. The right radial approach on 11/17/16 revealing a widely patent proximal circumflex stent, widely patent RCA with 70% lesion in the AV groove circumflex beyond the previously stented segment. I performed FFR which was 0.7 suggesting this was physiologically significant and stented that with a 3 mm x 12 mm long synergy drug-eluting stent post dilating up to 3.25 mm. This pain has since  Since I saw him a year ago he remained completely stable.  He denies chest pain or shortness of breath.  He did retire completely in January of this year after working 50 years.  He was recently begun on pravastatin 20 mg a day by his PCP because of an LDL in the 90 range which he is following.  Current Meds  Medication Sig  . ascorbic acid (VITAMIN C) 1000 MG tablet Take 1,000 mg by mouth daily.  Marland Kitchen aspirin 81 MG tablet Take 81 mg by mouth every evening.   . cholecalciferol (VITAMIN D) 1000 UNITS tablet Take 2,000 Units by mouth daily.   . clopidogrel  (PLAVIX) 75 MG tablet TAKE ONE TABLET BY MOUTH ONCE DAILY  . fenofibrate 160 MG tablet Take 160 mg by mouth daily.  Marland Kitchen levothyroxine (SYNTHROID, LEVOTHROID) 112 MCG tablet Take 112 mcg by mouth daily.  . magnesium oxide (MAG-OX) 400 MG tablet Take 400 mg by mouth daily.  . metoprolol succinate (TOPROL-XL) 100 MG 24 hr tablet Take 100 mg by mouth daily.  Marland Kitchen NITROSTAT 0.4 MG SL tablet PLACE ONE TABLET UNDER THE TONGUE EVERY 5 MINUTES AS NEEDED FOR CHEST PAIN  . omega-3 acid ethyl esters (LOVAZA) 1 g capsule Take 1 g by mouth 2 (two) times daily.  . ramipril (ALTACE) 10 MG capsule Take 10 mg by mouth every evening.   . sildenafil (REVATIO) 20 MG tablet Take 1 to 5 tablets by mouth daily as needed for erectile dysfunction.     Allergies  Allergen Reactions  . Sulfur     rash    Social History   Socioeconomic History  . Marital status: Married    Spouse name: Not on file  . Number of children: Not on file  . Years of education: Not on file  . Highest education level: Not on file  Occupational History  . Not on file  Social Needs  . Financial resource strain: Not on file  . Food insecurity:    Worry: Not on file    Inability: Not on file  . Transportation needs:    Medical: Not on file    Non-medical:  Not on file  Tobacco Use  . Smoking status: Former Smoker    Packs/day: 1.00    Years: 40.00    Pack years: 40.00    Types: Cigarettes    Last attempt to quit: 03/06/2001    Years since quitting: 16.9  . Smokeless tobacco: Never Used  Substance and Sexual Activity  . Alcohol use: No  . Drug use: No  . Sexual activity: Not on file  Lifestyle  . Physical activity:    Days per week: Not on file    Minutes per session: Not on file  . Stress: Not on file  Relationships  . Social connections:    Talks on phone: Not on file    Gets together: Not on file    Attends religious service: Not on file    Active member of club or organization: Not on file    Attends meetings of clubs  or organizations: Not on file    Relationship status: Not on file  . Intimate partner violence:    Fear of current or ex partner: Not on file    Emotionally abused: Not on file    Physically abused: Not on file    Forced sexual activity: Not on file  Other Topics Concern  . Not on file  Social History Narrative  . Not on file     Review of Systems: General: negative for chills, fever, night sweats or weight changes.  Cardiovascular: negative for chest pain, dyspnea on exertion, edema, orthopnea, palpitations, paroxysmal nocturnal dyspnea or shortness of breath Dermatological: negative for rash Respiratory: negative for cough or wheezing Urologic: negative for hematuria Abdominal: negative for nausea, vomiting, diarrhea, bright red blood per rectum, melena, or hematemesis Neurologic: negative for visual changes, syncope, or dizziness All other systems reviewed and are otherwise negative except as noted above.    Blood pressure (!) 152/90, pulse 70, height 5\' 8"  (1.727 m), weight 214 lb (97.1 kg).  General appearance: alert and no distress Neck: no adenopathy, no carotid bruit, no JVD, supple, symmetrical, trachea midline and thyroid not enlarged, symmetric, no tenderness/mass/nodules Lungs: clear to auscultation bilaterally Heart: regular rate and rhythm, S1, S2 normal, no murmur, click, rub or gallop Extremities: extremities normal, atraumatic, no cyanosis or edema Pulses: 2+ and symmetric Skin: Skin color, texture, turgor normal. No rashes or lesions Neurologic: Alert and oriented X 3, normal strength and tone. Normal symmetric reflexes. Normal coordination and gait  EKG sinus rhythm at 70 with right bundle branch block and left axis deviation with occasional PVCs.  I personally reviewed this EKG.  ASSESSMENT AND PLAN:   Dyslipidemia History of dyslipidemia with recent LDL of 97 checked 10/19/2017.  He was begun on pravastatin 20 mg by his PCP.  This will be rechecked by him  next week.  CAD S/P multiple PCIs History of CAD status post circumflex intervention by Dennis [name] back in 2002.  Dr. Angelena Form placed a stent in April 2016 after Dennis [name] performed a diagnostic cath.  Because of chest pain I restudied him via the right radial approach 11/17/2016 revealing a widely patent proximal circumflex stent with a distal lesion that was positive by FFR which I tented with a 3 mm x 12 mm long Synergy drug-eluting stent post dilating up to 3.25 mm.  He said no recurrent chest pain since I saw him a year ago.  He remains on dual antiplatelet therapy.  Essential hypertension History of hyperlipidemia blood pressure measured today 152/90.  He is on  metoprolol and ramipril.  Continue current meds at current dosing.      Dennis Harp MD FACP,FACC,FAHA, [facility] 02/20/2018 9:20 AM

## 2018-02-20 NOTE — Patient Instructions (Signed)

## 2018-02-23 DIAGNOSIS — I2510 Atherosclerotic heart disease of native coronary artery without angina pectoris: Secondary | ICD-10-CM | POA: Diagnosis not present

## 2018-02-23 DIAGNOSIS — R7309 Other abnormal glucose: Secondary | ICD-10-CM | POA: Diagnosis not present

## 2018-02-23 DIAGNOSIS — E785 Hyperlipidemia, unspecified: Secondary | ICD-10-CM | POA: Diagnosis not present

## 2018-02-23 DIAGNOSIS — I129 Hypertensive chronic kidney disease with stage 1 through stage 4 chronic kidney disease, or unspecified chronic kidney disease: Secondary | ICD-10-CM | POA: Diagnosis not present

## 2018-03-02 DIAGNOSIS — Z23 Encounter for immunization: Secondary | ICD-10-CM | POA: Diagnosis not present

## 2018-03-02 DIAGNOSIS — E785 Hyperlipidemia, unspecified: Secondary | ICD-10-CM | POA: Diagnosis not present

## 2018-03-02 DIAGNOSIS — I129 Hypertensive chronic kidney disease with stage 1 through stage 4 chronic kidney disease, or unspecified chronic kidney disease: Secondary | ICD-10-CM | POA: Diagnosis not present

## 2018-03-02 DIAGNOSIS — I2510 Atherosclerotic heart disease of native coronary artery without angina pectoris: Secondary | ICD-10-CM | POA: Diagnosis not present

## 2018-04-15 ENCOUNTER — Other Ambulatory Visit: Payer: Self-pay | Admitting: Cardiovascular Disease

## 2018-04-16 NOTE — Telephone Encounter (Signed)
Rx request sent to pharmacy.  

## 2018-06-07 ENCOUNTER — Encounter (HOSPITAL_COMMUNITY): Payer: Self-pay

## 2018-06-07 ENCOUNTER — Emergency Department (HOSPITAL_COMMUNITY)
Admission: EM | Admit: 2018-06-07 | Discharge: 2018-06-07 | Disposition: A | Discharge status: Home / Self Care | Payer: Medicare HMO | Attending: Emergency Medicine | Admitting: Emergency Medicine

## 2018-06-07 ENCOUNTER — Emergency Department (HOSPITAL_COMMUNITY): Payer: Medicare HMO

## 2018-06-07 DIAGNOSIS — I252 Old myocardial infarction: Secondary | ICD-10-CM | POA: Insufficient documentation

## 2018-06-07 DIAGNOSIS — I2510 Atherosclerotic heart disease of native coronary artery without angina pectoris: Secondary | ICD-10-CM | POA: Diagnosis not present

## 2018-06-07 DIAGNOSIS — Z955 Presence of coronary angioplasty implant and graft: Secondary | ICD-10-CM | POA: Insufficient documentation

## 2018-06-07 DIAGNOSIS — E039 Hypothyroidism, unspecified: Secondary | ICD-10-CM | POA: Diagnosis not present

## 2018-06-07 DIAGNOSIS — R0789 Other chest pain: Secondary | ICD-10-CM | POA: Insufficient documentation

## 2018-06-07 DIAGNOSIS — Z87891 Personal history of nicotine dependence: Secondary | ICD-10-CM | POA: Diagnosis not present

## 2018-06-07 DIAGNOSIS — Z79899 Other long term (current) drug therapy: Secondary | ICD-10-CM | POA: Insufficient documentation

## 2018-06-07 DIAGNOSIS — R079 Chest pain, unspecified: Secondary | ICD-10-CM | POA: Diagnosis present

## 2018-06-07 DIAGNOSIS — Z7982 Long term (current) use of aspirin: Secondary | ICD-10-CM | POA: Insufficient documentation

## 2018-06-07 DIAGNOSIS — Z7901 Long term (current) use of anticoagulants: Secondary | ICD-10-CM | POA: Insufficient documentation

## 2018-06-07 LAB — CBC
HCT: 43.4 % (ref 39.0–52.0)
Hemoglobin: 13.9 g/dL (ref 13.0–17.0)
MCH: 29.6 pg (ref 26.0–34.0)
MCHC: 32 g/dL (ref 30.0–36.0)
MCV: 92.3 fL (ref 80.0–100.0)
Platelets: 243 10*3/uL (ref 150–400)
RBC: 4.7 MIL/uL (ref 4.22–5.81)
RDW: 13.2 % (ref 11.5–15.5)
WBC: 6.3 10*3/uL (ref 4.0–10.5)
nRBC: 0 % (ref 0.0–0.2)

## 2018-06-07 LAB — BASIC METABOLIC PANEL
Anion gap: 11 (ref 5–15)
BUN: 16 mg/dL (ref 8–23)
CO2: 23 mmol/L (ref 22–32)
Calcium: 9.6 mg/dL (ref 8.9–10.3)
Chloride: 104 mmol/L (ref 98–111)
Creatinine, Ser: 1.14 mg/dL (ref 0.61–1.24)
GFR calc Af Amer: >60 mL/min (ref >60)
GFR calc non Af Amer: >60 mL/min (ref >60)
Glucose, Bld: 140 mg/dL — ABNORMAL HIGH (ref 70–99)
Potassium: 4 mmol/L (ref 3.5–5.1)
Sodium: 138 mmol/L (ref 135–145)

## 2018-06-07 LAB — I-STAT TROPONIN, ED
Troponin i, poc: 0.01 ng/mL (ref 0.00–0.08)
Troponin i, poc: 0.02 ng/mL (ref 0.00–0.08)

## 2018-06-07 MED ORDER — AMLODIPINE BESYLATE 10 MG PO TABS: 10.0000 mg | ORAL_TABLET | Freq: Every day | ORAL | 1 refills | Status: DC

## 2018-06-07 MED ORDER — ASPIRIN 325 MG PO TABS: 325.0000 mg | ORAL_TABLET | Freq: Every day | ORAL | Status: DC

## 2018-06-07 MED ORDER — ASPIRIN 325 MG PO TABS
325.0000 mg | ORAL_TABLET | Freq: Once | ORAL | Status: DC
  Filled 2018-06-07: qty 1

## 2018-06-07 MED ORDER — AMLODIPINE BESYLATE 5 MG PO TABS
10.0000 mg | ORAL_TABLET | ORAL | Status: AC
  Administered 2018-06-07: 10 mg via ORAL
  Filled 2018-06-07: qty 2

## 2018-06-07 NOTE — ED Provider Notes (Signed)
[facility] EMERGENCY DEPARTMENT Provider Note   CSN: [id number] Arrival date & time: 06/07/18  1102     History   Chief Complaint Chief Complaint  Patient presents with  . Chest Pain    HPI Dennis [name] is a 73 y.o. male.  The history is provided by the patient and medical records.     Patient is a 73 year old male with a past medical history of CAD status post PCI with DES to his RCA and L circumflex currently on Brilinta and ASA, CHF with an EF of 40%, GERD, HTN, HDL, hypothyroidism, and squamous of carcinoma scalp who presents for evaluation of an episode of chest pressure that occurred around 9:30 AM today.  Patient states that this episode lasted approximately 5 minutes and resolved with nitro.  It occurred while he was working at his desk and was not exertional in nature.  He endorses prior similar episodes with his prior MIs.  He states the pressure did not radiate and was not associated diaphoresis or nausea.  He notes a nonproductive cough over the last 2 3 days but denies any other fevers, chills, headache, earache, sore throat, shortness of breath, abdominal pain, nausea, vomiting, diarrhea, dysuria, blood in stool, blood in his urine, rash extremity pain, or other acute complaints.  States he has been compliant with his medications.  Denies history of CVA, PE/DVT, tobacco abuse, recent travel [place], or other acute concerns.  Past Medical History:  Diagnosis Date  . Anginal pain ([place])   . CAD (coronary artery disease)    a. 2002 Stent RCA & circumflex EF40-50%   . GERD (gastroesophageal reflux disease)   . Hyperlipidemia   . Hypertension   . Hypothyroidism   . Myocardial infarction ([place]) 03/2001  . Squamous cell carcinoma of scalp    "some burned, some cut off" (11/17/2016)  . Thyroid nodule, hot    s/p radioactive iodine, now hypothyroidism    Patient Active Problem List   Diagnosis Date Noted  . Essential hypertension 10/03/2014    . RBBB 10/03/2014  . Unstable angina ([place]) 09/30/2014  . Hypothyroidism   . Dyslipidemia   . CAD S/P multiple PCIs   . Thyroid nodule, hot     Past Surgical History:  Procedure Laterality Date  . CARDIAC CATHETERIZATION  09/2014   "day before they put the stent in"  . CORONARY ANGIOPLASTY WITH STENT PLACEMENT  03/2001; 09/2014; 11/17/2016   "1+1+1"  . CORONARY STENT INTERVENTION N/A 11/17/2016   Procedure: Coronary Stent Intervention;  Surgeon: Lorretta Harp, MD;  Location: [facility] CV LAB;  Service: Cardiovascular;  Laterality: N/A;  . INTRAVASCULAR PRESSURE WIRE/FFR STUDY N/A 11/17/2016   Procedure: Intravascular Pressure Wire/FFR Study;  Surgeon: Lorretta Harp, MD;  Location: [facility];  Service: Cardiovascular;  Laterality: N/A;  . LEFT HEART CATH AND CORONARY ANGIOGRAPHY N/A 11/17/2016   Procedure: Left Heart Cath and Coronary Angiography;  Surgeon: Lorretta Harp, MD;  Location: [facility];  Service: Cardiovascular;  Laterality: N/A;  . LEFT HEART CATHETERIZATION WITH CORONARY ANGIOGRAM N/A 09/30/2014   Procedure: LEFT HEART CATHETERIZATION WITH CORONARY ANGIOGRAM;  Surgeon: Troy Sine, MD;  Location: [facility] CATH LAB;  Service: Cardiovascular;  Laterality: N/A;  . PERCUTANEOUS CORONARY STENT INTERVENTION (PCI-S) N/A 10/02/2014   Procedure: PERCUTANEOUS CORONARY STENT INTERVENTION (PCI-S);  Surgeon: Burnell Blanks, MD;  Location: [facility] CATH LAB;  Service: Cardiovascular;  Laterality: N/A;  . SQUAMOUS CELL CARCINOMA EXCISION     "  scalp"        Home Medications    Prior to Admission medications   Medication Sig Start Date End Date Taking? Authorizing Provider  ascorbic acid (VITAMIN C) 1000 MG tablet Take 1,000 mg by mouth daily.   Yes [provider]  aspirin 81 MG tablet Take 81 mg by mouth every evening.    Yes [provider]  cholecalciferol (VITAMIN D) 1000 UNITS tablet Take 2,000 Units by mouth daily.    Yes [provider]  clopidogrel (PLAVIX) 75 MG tablet TAKE 1 TABLET BY MOUTH ONCE DAILY 04/16/18  Yes Lorretta Harp, MD  fenofibrate 160 MG tablet Take 160 mg by mouth daily. 07/28/14  Yes [provider]  levothyroxine (SYNTHROID, LEVOTHROID) 112 MCG tablet Take 112 mcg by mouth daily. 09/19/14  Yes [provider]  magnesium oxide (MAG-OX) 400 MG tablet Take 400 mg by mouth daily.   Yes [provider]  metoprolol succinate (TOPROL-XL) 100 MG 24 hr tablet Take 100 mg by mouth daily. 07/18/14  Yes [provider]  NITROSTAT 0.4 MG SL tablet PLACE ONE TABLET UNDER THE TONGUE EVERY 5 MINUTES AS NEEDED FOR CHEST PAIN Patient taking differently: Place 0.4 mg under the tongue every 5 (five) minutes as needed for chest pain.  07/25/16  Yes Lorretta Harp, MD  omega-3 acid ethyl esters (LOVAZA) 1 g capsule Take 1 g by mouth 2 (two) times daily.   Yes [provider]  ramipril (ALTACE) 10 MG capsule Take 10 mg by mouth every evening.  08/20/14  Yes [provider]  amLODipine (NORVASC) 10 MG tablet Take 1 tablet (10 mg total) by mouth daily. 06/07/18 08/06/18  Hulan Saas, MD    Family History No family history on file.  Social History Social History   Tobacco Use  . Smoking status: Former Smoker    Packs/day: 1.00    Years: 40.00    Pack years: 40.00    Types: Cigarettes    Last attempt to quit: 03/06/2001    Years since quitting: 17.2  . Smokeless tobacco: Never Used  Substance Use Topics  . Alcohol use: No  . Drug use: No     Allergies   Sulfur   Review of Systems Review of Systems  Constitutional: Negative for chills and fever.  HENT: Negative for ear pain and sore throat.   Eyes: Negative for pain and visual disturbance.  Respiratory: Negative for cough and shortness of breath.   Cardiovascular: Positive for chest pain. Negative for palpitations.  Gastrointestinal: Negative for abdominal pain and vomiting.  Genitourinary:  Negative for dysuria and hematuria.  Musculoskeletal: Negative for arthralgias and back pain.  Skin: Negative for color change and rash.  Neurological: Negative for seizures and syncope.  All other systems reviewed and are negative.    Physical Exam Updated Vital Signs BP (!) 182/92 (BP Location: Right Arm) Comment: Simultaneous filing. User may not have seen previous data.  Pulse 63   Temp 97.9 F (36.6 C) (Oral)   Resp 11   SpO2 98%   Physical Exam Vitals signs and nursing note reviewed.  Constitutional:      Appearance: He is well-developed and normal weight.  HENT:     Head: Normocephalic and atraumatic.  Eyes:     Extraocular Movements: Extraocular movements intact.     Conjunctiva/sclera: Conjunctivae normal.     Pupils: Pupils are equal, round, and reactive to light.  Neck:     Musculoskeletal: Neck  supple.  Cardiovascular:     Rate and Rhythm: Normal rate and regular rhythm.     Pulses:          Carotid pulses are 2+ on the right side and 2+ on the left side.    Heart sounds: Normal heart sounds. Heart sounds not distant. No murmur.  Pulmonary:     Effort: Pulmonary effort is normal. No respiratory distress.     Breath sounds: Normal breath sounds.  Abdominal:     Palpations: Abdomen is soft.     Tenderness: There is no abdominal tenderness.  Musculoskeletal:     Right lower leg: Edema present.     Left lower leg: Edema present.  Skin:    General: Skin is warm and dry.  Neurological:     Mental Status: He is alert.      ED Treatments / Results  Labs (all labs ordered are listed, but only abnormal results are displayed) Labs Reviewed  BASIC METABOLIC PANEL - Abnormal; Notable for the following components:      Result Value   Glucose, Bld 140 (*)    All other components within normal limits  CBC  I-STAT TROPONIN, ED  I-STAT TROPONIN, ED    EKG EKG Interpretation  Date/Time:  Thursday June 07 2018 15:29:17 EST Ventricular Rate:  65 PR  Interval:  178 QRS Duration: 150 QT Interval:  444 QTC Calculation: 462 R Axis:   -61 Text Interpretation:  Sinus rhythm RBBB and LAFB no significant change since earlier in the day Confirmed by Sherwood Gambler [phone]) on 06/07/2018 3:36:41 PM   Radiology Dg Chest 2 View  Result Date: 06/07/2018 CLINICAL DATA:  73 year old male with chest pain and pressure. EXAM: CHEST - 2 VIEW COMPARISON:  11/07/2016 and earlier. FINDINGS: Stable cardiac size at the upper limits of normal. Other mediastinal contours are within normal limits. Visualized tracheal air column is within normal limits. Both lungs are clear. No pneumothorax or pleural effusion. No acute osseous abnormality identified. Negative visible bowel gas pattern. IMPRESSION: No acute cardiopulmonary abnormality. Electronically Signed   By: Genevie Ann M.D.   On: 06/07/2018 12:27    Procedures Procedures (including critical care time)  Medications Ordered in ED Medications  aspirin tablet 325 mg (325 mg Oral Refused 06/07/18 1540)  amLODipine (NORVASC) tablet 10 mg (10 mg Oral Given 06/07/18 1610)     Initial Impression / Assessment and Plan / ED Course  I have reviewed the triage vital signs and the nursing notes.  Pertinent labs & imaging results that were available during my care of the patient were reviewed by me and considered in my medical decision making (see chart for details).     Patient is a 73 year old male who presents above-stated history exam.  On presentation patient is afebrile with a BP of 211/102 with otherwise stable vital signs.  On initial assessment by this provider patient's blood pressure was noted to have decreased into the 180s over 110s.  Exam as above remarkable for bilateral lower extremity edema without any murmurs and otherwise unremarkable exam. Patient refused ASA in the ED.  EKG shows a right bundle-branch block with otherwise no signs of acute ischemic change.  This appears to be consistent with prior tracings.   Initial troponin is 0.01.  BMP and CBC are WNL.  Therefore I have a low suspicion for symptomatic anemia or significant metabolic or electrolyte abnormalities.  Chest x-ray does not show any acute findings suggestive of pneumonia, pneumothorax, pulmonary edema,  or pleural effusion.  Given patient's age and risk factors will obtain 3-hour troponin.  3-hour troponin is nonelevated.  I discussed this patient with cardiology who recommended initiating amlodipine and close outpatient follow-up.  History exam is not consistent with PE, aortic dissection, sepsis, pneumonia, or other imminently life-threatening pathology.  Impression is stable angina.  Patient discharged in stable condition.  Strict return precautions advised and discussed.  Instructed to follow-up with PCP and cardiology in 3 to 5 days.  Patient given Rx for amlodipine.  Patient voiced understanding and agreement with this plan. Final Clinical Impressions(s) / ED Diagnoses   Final diagnoses:  Chest pressure    ED Discharge Orders         Ordered    amLODipine (NORVASC) 10 MG tablet  Daily     06/07/18 1554           Hulan Saas, MD 06/07/18 1631    Sherwood Gambler, MD 06/07/18 2351

## 2018-06-07 NOTE — ED Triage Notes (Signed)
Pt reports dizziness with chest pressure this morning, took 1 nitro with releif of pressure. Hx of multiple stents placed, most recent 18 months ago. Pt denies pain at this time. NAD noted

## 2018-06-07 NOTE — ED Notes (Signed)
Patient verbalizes understanding of discharge instructions. Opportunity for questioning and answers were provided. Armband removed by staff, pt discharged from ED. Prescriptions reviewed. Pt ambulatory to lobby.

## 2018-06-12 ENCOUNTER — Ambulatory Visit: Payer: Medicare HMO | Admitting: Cardiovascular Disease

## 2018-06-12 ENCOUNTER — Encounter: Payer: Self-pay | Admitting: Cardiovascular Disease

## 2018-06-12 DIAGNOSIS — E785 Hyperlipidemia, unspecified: Secondary | ICD-10-CM | POA: Diagnosis not present

## 2018-06-12 DIAGNOSIS — I4510 Unspecified right bundle-branch block: Secondary | ICD-10-CM

## 2018-06-12 DIAGNOSIS — Z9861 Coronary angioplasty status: Secondary | ICD-10-CM

## 2018-06-12 DIAGNOSIS — I2510 Atherosclerotic heart disease of native coronary artery without angina pectoris: Secondary | ICD-10-CM | POA: Diagnosis not present

## 2018-06-12 DIAGNOSIS — I10 Essential (primary) hypertension: Secondary | ICD-10-CM | POA: Diagnosis not present

## 2018-06-12 NOTE — Assessment & Plan Note (Signed)
History of essential hypertension blood pressure measured today 142/90.  He is on Toprol, amlodipine which was recently added and ramipril.  I have asked him to keep a blood pressure log for the next 30 days and he will see Dennis [name] back in follow-up for review and medicine titration.

## 2018-06-12 NOTE — Assessment & Plan Note (Signed)
History of CAD status post RCA stenting back in 2002, circumflex stenting April 2016 by Dr. Angelena Form and [facility] guided circumflex intervention by myself June 2018.  He has normal LV function.  He remains on dual antiplatelet therapy.  He was recently seen in the emergency room for atypical chest pain which lasted a few seconds on 06/07/2018 and ruled out for myocardial infarction.  He said no recurrent symptoms.

## 2018-06-12 NOTE — Patient Instructions (Signed)
Medication Instructions:  NO CHANGES If you need a refill on your cardiac medications before your next appointment, please call your pharmacy.   Lab work: NONE If you have labs (blood work) drawn today and your tests are completely normal, you will receive your results only by: Marland Kitchen MyChart Message (if you have MyChart) OR . A paper copy in the mail If you have any lab test that is abnormal or we need to change your treatment, we will call you to review the results.  Testing/Procedures: NONE  Follow-Up: At [facility], you and your health needs are our priority.  As part of our continuing mission to provide you with exceptional heart care, we have created designated Provider Care Teams.  These Care Teams include your primary Cardiologist (physician) and Advanced Practice Providers (APPs -  Physician Assistants and Nurse Practitioners) who all work together to provide you with the care you need, when you need it. . You will need a follow up appointment in 12 months.  Please call our office 2 months in advance to schedule this appointment.  You may see Dr. Gwenlyn Found or one of the following Advanced Practice Providers on your designated Care Team:   . Dennis [name], Dennis [name] . Dennis Deforest, Dennis [name] . Dennis Sharp, Dennis [name] . Dennis Sims, Dennis [name] . Dennis Ferries, Dennis [name] . Dennis Lofts, Dennis [name] . Dennis Rives, Dennis [name]  Any Other Special Instructions Will Be Listed Below (If Applicable). PLEASE KEEP A BLOOD PRESSURE LOG TO MONITOR YOUR BLOOD PRESSURE  FOLLOW UP WITH KRISTIN ALVSTAD, RPH IN 1 MONTH IN THE BLOOD PRESSURE CLINIC

## 2018-06-12 NOTE — Assessment & Plan Note (Signed)
History of dyslipidemia recently started on Crestor 20 mg a day followed by his PCP

## 2018-06-12 NOTE — Assessment & Plan Note (Signed)
Chronic. 

## 2018-06-12 NOTE — Progress Notes (Signed)
06/12/2018 Dennis [name]   [birth year]  [id number]  Primary Physician [name], MD Primary Cardiologist: Lorretta Harp MD [name], [place]  HPI:  Dennis [name] is a 72 y.o.  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 He is a patient of Dr. Thayer Jew Pharr's.  I last saw him in the office  02/20/2018.Marland Kitchen History of hyperlipidemia and CAD status post RCA and circumflex intervention by Dr. Glade Lloyd back in 2002. He was admitted to [facility] 09/30/14 with unstable angina. He underwent cardiac catheterization that day by Dr. Claiborne Billings revealing a high-grade mid circumflex lesion on a bend and was stented 2 days later by Dr. Julianne Handler with a Promus premiere drug eluting stent postdilated with a 4.5 mm balloon.When I saw him 3 months ago he had experienced several episodes of nitrate responsive chest pain. I ultimately decided to perform outpatient cardiac catheterization. The right radial approach on 11/17/16 revealing a widely patent proximal circumflex stent, widely patent RCA with 70% lesion in the AV groove circumflex beyond the previously stented segment. I performed FFR which was 0.7 suggesting this was physiologically significant and stented that with a 3 mm x 12 mm long synergy drug-eluting stent post dilating up to 3.25 mm. This pain has since  Since I saw him in September of last year he did well until 06/07/2018 when he developed a brief episode of chest pressure.  He was evaluated in the emergency room and ruled out for myocardial infarction.  He was hypertensive and was begun on amlodipine as well.  Current Meds  Medication Sig  . amLODipine (NORVASC) 10 MG tablet Take 1 tablet (10 mg total) by mouth daily.  Marland Kitchen ascorbic acid (VITAMIN C) 1000 MG tablet Take 1,000 mg by mouth daily.  Marland Kitchen aspirin 81 MG tablet Take 81 mg by mouth every evening.   . cholecalciferol (VITAMIN D) 1000 UNITS tablet Take  2,000 Units by mouth daily.   . clopidogrel (PLAVIX) 75 MG tablet TAKE 1 TABLET BY MOUTH ONCE DAILY  . fenofibrate 160 MG tablet Take 160 mg by mouth daily.  Marland Kitchen levothyroxine (SYNTHROID, LEVOTHROID) 112 MCG tablet Take 112 mcg by mouth daily.  . magnesium oxide (MAG-OX) 400 MG tablet Take 400 mg by mouth daily.  . metoprolol succinate (TOPROL-XL) 100 MG 24 hr tablet Take 100 mg by mouth daily.  Marland Kitchen NITROSTAT 0.4 MG SL tablet PLACE ONE TABLET UNDER THE TONGUE EVERY 5 MINUTES AS NEEDED FOR CHEST PAIN (Patient taking differently: Place 0.4 mg under the tongue every 5 (five) minutes as needed for chest pain. )  . omega-3 acid ethyl esters (LOVAZA) 1 g capsule Take 1 g by mouth 2 (two) times daily.  . ramipril (ALTACE) 10 MG capsule Take 10 mg by mouth every evening.      Allergies  Allergen Reactions  . Sulfur     rash    Social History   Socioeconomic History  . Marital status: Married    Spouse name: Not on file  . Number of children: Not on file  . Years of education: Not on file  . Highest education level: Not on file  Occupational History  . Not on file  Social Needs  . Financial resource strain: Not on file  . Food insecurity:    Worry: Not on file    Inability: Not on file  . Transportation needs:    Medical: Not on file  Non-medical: Not on file  Tobacco Use  . Smoking status: Former Smoker    Packs/day: 1.00    Years: 40.00    Pack years: 40.00    Types: Cigarettes    Last attempt to quit: 03/06/2001    Years since quitting: 17.2  . Smokeless tobacco: Never Used  Substance and Sexual Activity  . Alcohol use: No  . Drug use: No  . Sexual activity: Not on file  Lifestyle  . Physical activity:    Days per week: Not on file    Minutes per session: Not on file  . Stress: Not on file  Relationships  . Social connections:    Talks on phone: Not on file    Gets together: Not on file    Attends religious service: Not on file    Active member of club or  organization: Not on file    Attends meetings of clubs or organizations: Not on file    Relationship status: Not on file  . Intimate partner violence:    Fear of current or ex partner: Not on file    Emotionally abused: Not on file    Physically abused: Not on file    Forced sexual activity: Not on file  Other Topics Concern  . Not on file  Social History Narrative  . Not on file     Review of Systems: General: negative for chills, fever, night sweats or weight changes.  Cardiovascular: negative for chest pain, dyspnea on exertion, edema, orthopnea, palpitations, paroxysmal nocturnal dyspnea or shortness of breath Dermatological: negative for rash Respiratory: negative for cough or wheezing Urologic: negative for hematuria Abdominal: negative for nausea, vomiting, diarrhea, bright red blood per rectum, melena, or hematemesis Neurologic: negative for visual changes, syncope, or dizziness All other systems reviewed and are otherwise negative except as noted above.    Blood pressure (!) 142/90, pulse 69, height 5\' 8"  (1.727 m), weight 212 lb (96.2 kg).  General appearance: alert and no distress Neck: no adenopathy, no carotid bruit, no JVD, supple, symmetrical, trachea midline and thyroid not enlarged, symmetric, no tenderness/mass/nodules Lungs: clear to auscultation bilaterally Heart: regular rate and rhythm, S1, S2 normal, no murmur, click, rub or gallop Extremities: extremities normal, atraumatic, no cyanosis or edema Pulses: 2+ and symmetric Skin: Skin color, texture, turgor normal. No rashes or lesions Neurologic: Alert and oriented X 3, normal strength and tone. Normal symmetric reflexes. Normal coordination and gait  EKG not performed today  ASSESSMENT AND PLAN:   Dyslipidemia History of dyslipidemia recently started on Crestor 20 mg a day followed by his PCP  CAD S/P multiple PCIs History of CAD status post RCA stenting back in 2002, circumflex stenting April 2016 by  Dr. Angelena Form and [facility] guided circumflex intervention by myself June 2018.  He has normal LV function.  He remains on dual antiplatelet therapy.  He was recently seen in the emergency room for atypical chest pain which lasted a few seconds on 06/07/2018 and ruled out for myocardial infarction.  He said no recurrent symptoms.  Essential hypertension History of essential hypertension blood pressure measured today 142/90.  He is on Toprol, amlodipine which was recently added and ramipril.  I have asked him to keep a blood pressure log for the next 30 days and he will see Cyril Mourning back in follow-up for review and medicine titration.  RBBB Chronic      Lorretta Harp MD FACP,FACC,FAHA, [facility] 06/12/2018 3:52 PM

## 2018-06-13 ENCOUNTER — Other Ambulatory Visit: Payer: Self-pay | Admitting: Cardiovascular Disease

## 2018-06-29 DIAGNOSIS — I2510 Atherosclerotic heart disease of native coronary artery without angina pectoris: Secondary | ICD-10-CM | POA: Diagnosis not present

## 2018-06-29 DIAGNOSIS — E785 Hyperlipidemia, unspecified: Secondary | ICD-10-CM | POA: Diagnosis not present

## 2018-06-29 DIAGNOSIS — I129 Hypertensive chronic kidney disease with stage 1 through stage 4 chronic kidney disease, or unspecified chronic kidney disease: Secondary | ICD-10-CM | POA: Diagnosis not present

## 2018-07-06 DIAGNOSIS — E785 Hyperlipidemia, unspecified: Secondary | ICD-10-CM | POA: Diagnosis not present

## 2018-07-06 DIAGNOSIS — I2510 Atherosclerotic heart disease of native coronary artery without angina pectoris: Secondary | ICD-10-CM | POA: Diagnosis not present

## 2018-07-06 DIAGNOSIS — I129 Hypertensive chronic kidney disease with stage 1 through stage 4 chronic kidney disease, or unspecified chronic kidney disease: Secondary | ICD-10-CM | POA: Diagnosis not present

## 2018-07-06 DIAGNOSIS — N182 Chronic kidney disease, stage 2 (mild): Secondary | ICD-10-CM | POA: Diagnosis not present

## 2018-07-06 DIAGNOSIS — R600 Localized edema: Secondary | ICD-10-CM | POA: Diagnosis not present

## 2018-07-13 ENCOUNTER — Ambulatory Visit (INDEPENDENT_AMBULATORY_CARE_PROVIDER_SITE_OTHER): Payer: Medicare HMO | Admitting: Pharmacist Clinician (PhC)/ Clinical Pharmacy Specialist

## 2018-07-13 DIAGNOSIS — I10 Essential (primary) hypertension: Secondary | ICD-10-CM

## 2018-07-13 MED ORDER — AMLODIPINE BESYLATE 5 MG PO TABS: 5.0000 mg | ORAL_TABLET | Freq: Every day | ORAL | 3 refills | Status: DC

## 2018-07-13 NOTE — Assessment & Plan Note (Signed)
Patient with essential hypertension, now doing well on amlodipine 5 mg daily in addition to ramipril 10 mg and metoprolol succ 100 mg (both once daily).  His ankle edema has improved, but is still noticeable.   I have asked him to continue monitoring this for another week or two.  He is to call if the swelling persists and I will have him stop that and most likely start chlorthalidone 12.5 mg.  Patient aware to continue with regular home monitoring for now.

## 2018-07-13 NOTE — Patient Instructions (Signed)
Call in 2-3 weeks to let me know how your ankles are doing.  If the swelling does not go down, will consider switching you to a different medication  Your blood pressure today is 142/82  Check your blood pressure at home daily and keep record of the readings.  Take your BP meds as follows:  Continue with all your current medications  Bring all of your meds, your BP cuff and your record of home blood pressures to your next appointment.  Exercise as you're able, try to walk approximately 30 minutes per day.  Keep salt intake to a minimum, especially watch canned and prepared boxed foods.  Eat more fresh fruits and vegetables and fewer canned items.  Avoid eating in fast food restaurants.    HOW TO TAKE YOUR BLOOD PRESSURE: . Rest 5 minutes before taking your blood pressure. .  Don't smoke or drink caffeinated beverages for at least 30 minutes before. . Take your blood pressure before (not after) you eat. . Sit comfortably with your back supported and both feet on the floor (don't cross your legs). . Elevate your arm to heart level on a table or a desk. . Use the proper sized cuff. It should fit smoothly and snugly around your bare upper arm. There should be enough room to slip a fingertip under the cuff. The bottom edge of the cuff should be 1 inch above the crease of the elbow. . Ideally, take 3 measurements at one sitting and record the average.
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
## 2018-07-26 DIAGNOSIS — L570 Actinic keratosis: Secondary | ICD-10-CM | POA: Diagnosis not present

## 2018-07-26 DIAGNOSIS — D485 Neoplasm of uncertain behavior of skin: Secondary | ICD-10-CM | POA: Diagnosis not present

## 2018-07-26 DIAGNOSIS — D044 Carcinoma in situ of skin of scalp and neck: Secondary | ICD-10-CM | POA: Diagnosis not present

## 2018-07-26 DIAGNOSIS — Z85828 Personal history of other malignant neoplasm of skin: Secondary | ICD-10-CM | POA: Diagnosis not present

## 2018-08-03 DIAGNOSIS — I724 Aneurysm of artery of lower extremity: Secondary | ICD-10-CM | POA: Diagnosis not present

## 2018-08-06 DIAGNOSIS — D044 Carcinoma in situ of skin of scalp and neck: Secondary | ICD-10-CM | POA: Diagnosis not present

## 2018-08-06 DIAGNOSIS — Z85828 Personal history of other malignant neoplasm of skin: Secondary | ICD-10-CM | POA: Diagnosis not present

## 2018-08-08 DIAGNOSIS — N182 Chronic kidney disease, stage 2 (mild): Secondary | ICD-10-CM | POA: Diagnosis not present

## 2018-08-08 DIAGNOSIS — I129 Hypertensive chronic kidney disease with stage 1 through stage 4 chronic kidney disease, or unspecified chronic kidney disease: Secondary | ICD-10-CM | POA: Diagnosis not present

## 2018-08-08 DIAGNOSIS — I724 Aneurysm of artery of lower extremity: Secondary | ICD-10-CM | POA: Diagnosis not present

## 2018-08-08 DIAGNOSIS — R600 Localized edema: Secondary | ICD-10-CM | POA: Diagnosis not present

## 2018-08-22 ENCOUNTER — Ambulatory Visit: Payer: Medicare HMO | Admitting: Cardiovascular Disease

## 2018-08-23 DIAGNOSIS — R69 Illness, unspecified: Secondary | ICD-10-CM | POA: Diagnosis not present

## 2018-08-27 ENCOUNTER — Telehealth: Payer: Self-pay | Admitting: Vascular Surgery

## 2018-08-27 NOTE — Telephone Encounter (Signed)
The above patient or their representative was contacted and gave the following answers to these questions:         Do you have any of the following symptoms? no  Fever                    Cough                   Shortness of breath  Do  you have any of the following other symptoms? no   muscle pain         vomiting,        diarrhea        rash         weakness        red eye        abdominal pain         bruising          bruising or bleeding              joint pain           severe headache    Have you been in contact with someone who was or has been sick in the past 2 weeks?no  Yes                 Unsure                         Unable to assess   Does the person that you were in contact with have any of the following symptoms? no  Cough         shortness of breath           muscle pain         vomiting,            diarrhea            rash            weakness           fever            red eye           abdominal pain           bruising  or  bleeding                joint pain                severe headache               Have you  or someone you have been in contact with traveled internationally in th last month?  No If yes, which countries?   Have you  or someone you have been in contact with traveled outside [place] in th last month?    No     If yes, which state and city? no  COMMENTS OR ACTION PLAN FOR THIS PATIENT:

## 2018-08-28 ENCOUNTER — Ambulatory Visit (INDEPENDENT_AMBULATORY_CARE_PROVIDER_SITE_OTHER): Payer: Medicare HMO | Admitting: Vascular Surgery

## 2018-08-28 ENCOUNTER — Encounter: Payer: Self-pay | Admitting: Vascular Surgery

## 2018-08-28 ENCOUNTER — Other Ambulatory Visit: Payer: Self-pay

## 2018-08-28 ENCOUNTER — Encounter: Payer: Medicare HMO | Admitting: Vascular Surgery

## 2018-08-28 VITALS — BP 140/80 | HR 65 | Temp 97.4°F | Resp 18 | Ht 68.0 in | Wt 216.0 lb

## 2018-08-28 DIAGNOSIS — I724 Aneurysm of artery of lower extremity: Secondary | ICD-10-CM
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
## 2018-09-04 ENCOUNTER — Ambulatory Visit: Payer: Medicare HMO | Admitting: Cardiovascular Disease

## 2018-09-12 DIAGNOSIS — N182 Chronic kidney disease, stage 2 (mild): Secondary | ICD-10-CM | POA: Diagnosis not present

## 2018-09-12 DIAGNOSIS — I129 Hypertensive chronic kidney disease with stage 1 through stage 4 chronic kidney disease, or unspecified chronic kidney disease: Secondary | ICD-10-CM | POA: Diagnosis not present

## 2018-09-19 DIAGNOSIS — I2510 Atherosclerotic heart disease of native coronary artery without angina pectoris: Secondary | ICD-10-CM | POA: Diagnosis not present

## 2018-09-19 DIAGNOSIS — I724 Aneurysm of artery of lower extremity: Secondary | ICD-10-CM | POA: Diagnosis not present

## 2018-09-19 DIAGNOSIS — I129 Hypertensive chronic kidney disease with stage 1 through stage 4 chronic kidney disease, or unspecified chronic kidney disease: Secondary | ICD-10-CM | POA: Diagnosis not present

## 2018-09-19 DIAGNOSIS — R600 Localized edema: Secondary | ICD-10-CM | POA: Diagnosis not present

## 2018-10-12 ENCOUNTER — Other Ambulatory Visit: Payer: Self-pay | Admitting: Cardiovascular Disease

## 2018-10-12 NOTE — Telephone Encounter (Signed)
Clopidogrel refilled

## 2018-11-06 ENCOUNTER — Other Ambulatory Visit: Payer: Self-pay | Admitting: Vascular Surgery

## 2018-11-06 DIAGNOSIS — I724 Aneurysm of artery of lower extremity: Secondary | ICD-10-CM

## 2018-11-09 DIAGNOSIS — E785 Hyperlipidemia, unspecified: Secondary | ICD-10-CM | POA: Diagnosis not present

## 2018-11-09 DIAGNOSIS — N182 Chronic kidney disease, stage 2 (mild): Secondary | ICD-10-CM | POA: Diagnosis not present

## 2018-11-09 DIAGNOSIS — I2510 Atherosclerotic heart disease of native coronary artery without angina pectoris: Secondary | ICD-10-CM | POA: Diagnosis not present

## 2018-11-09 DIAGNOSIS — Z0000 Encounter for general adult medical examination without abnormal findings: Secondary | ICD-10-CM | POA: Diagnosis not present

## 2018-11-09 DIAGNOSIS — Z7189 Other specified counseling: Secondary | ICD-10-CM | POA: Diagnosis not present

## 2018-11-09 DIAGNOSIS — I129 Hypertensive chronic kidney disease with stage 1 through stage 4 chronic kidney disease, or unspecified chronic kidney disease: Secondary | ICD-10-CM | POA: Diagnosis not present

## 2018-11-12 ENCOUNTER — Telehealth: Payer: Self-pay

## 2018-11-12 ENCOUNTER — Ambulatory Visit (INDEPENDENT_AMBULATORY_CARE_PROVIDER_SITE_OTHER): Payer: Medicare HMO | Admitting: Physician Assistant

## 2018-11-12 ENCOUNTER — Other Ambulatory Visit: Payer: Self-pay

## 2018-11-12 ENCOUNTER — Other Ambulatory Visit: Payer: Self-pay | Admitting: Physician Assistant

## 2018-11-12 DIAGNOSIS — I724 Aneurysm of artery of lower extremity: Secondary | ICD-10-CM

## 2018-11-12 HISTORY — DX: Aneurysm of artery of lower extremity: I72.4

## 2018-11-12 MED ORDER — GABAPENTIN 300 MG PO CAPS: 300.0000 mg | ORAL_CAPSULE | Freq: Every day | ORAL | 0 refills | Status: DC

## 2018-11-12 NOTE — Progress Notes (Signed)
Established Popliteal Aneurysm    History of Present Illness   Dennis [name] is a 73 y.o. (06-23-1945) male who presents with chief complaint: new nerve pain, tingling, numbness RLE.  He was last seen by Dr. Donnetta Hutching in March of this year.  At that time repair of popliteal aneurysm with bypass surgery was discussed with the patient.  He is scheduled to have a CTA aorta with runoff on June 19 and follow-up with Dr. Donnetta Hutching on June 23.  Patient states that several weeks ago he began to develop some aching pain in the area of his aneurysm with con current numbness, pain, and tingling in posterior thigh as well as lateral shin.  This discomfort does not respond to ibuprofen.  Patient is wondering if he can be prescribed medication to alleviate this discomfort to hold him over until surgery is scheduled.  He denies any rest pain, toe discoloration, or other tissue changes in right lower extremity.  He is taking aspirin and Plavix daily.  Current Outpatient Medications  Medication Sig Dispense Refill  . ascorbic acid (VITAMIN C) 1000 MG tablet Take 1,000 mg by mouth daily.    Marland Kitchen aspirin 81 MG tablet Take 81 mg by mouth every evening.     . cholecalciferol (VITAMIN D) 1000 UNITS tablet Take 2,000 Units by mouth daily.     . clopidogrel (PLAVIX) 75 MG tablet Take 1 tablet by mouth once daily 90 tablet 0  . fenofibrate 160 MG tablet Take 160 mg by mouth daily.    . furosemide (LASIX) 20 MG tablet Take 20 mg by mouth daily as needed.    Marland Kitchen levothyroxine (SYNTHROID, LEVOTHROID) 112 MCG tablet Take 112 mcg by mouth daily.    . magnesium oxide (MAG-OX) 400 MG tablet Take 400 mg by mouth daily.    . metoprolol succinate (TOPROL-XL) 100 MG 24 hr tablet Take 100 mg by mouth daily.    . nitroGLYCERIN (NITROSTAT) 0.4 MG SL tablet Place 1 tablet (0.4 mg total) under the tongue every 5 (five) minutes as needed for chest pain. 25 tablet 2  . omega-3 acid ethyl esters (LOVAZA) 1 g capsule Take 1 g by mouth 2  (two) times daily.    . ramipril (ALTACE) 10 MG capsule Take 10 mg by mouth every evening.     Marland Kitchen amLODipine (NORVASC) 5 MG tablet Take 1 tablet (5 mg total) by mouth daily. 90 tablet 3  . gabapentin (NEURONTIN) 300 MG capsule Take 1 capsule (300 mg total) by mouth daily. 30 capsule 0   No current facility-administered medications for this visit.     Allergies  Allergen Reactions  . Sulfur     rash    On ROS today: 10 system ROS is negative unless otherwise noted in HPI   Physical Examination   Vitals:   11/12/18 1456  BP: (!) 151/89  Pulse: 61  Resp: 14  Temp: 97.8 F (36.6 C)  TempSrc: Oral  SpO2: 96%  Weight: 215 lb 11.2 oz (97.8 kg)  Height: 5\' 8"  (1.727 m)   Body mass index is 32.8 kg/m.  General Alert, O x 3, WD, NAD  Pulmonary Sym exp, good B air movt, CTA B  Cardiac RRR, Nl S1, S2  Vascular Vessel Right Left  Radial Palpable Palpable  Aorta Not palpable N/A  Femoral Palpable Palpable  Popliteal Prominently palpable Palpable  PT Palpable Not palpable  DP Palpable Palpable    Gastro- intestinal soft, non-distended, non-tender to palpation, no  palpable aneurysm midline  Musculo- skeletal  no toe discoloration; no other tissue changes right lower extremity; prominently palpable right popliteal aneurysm; palpable right DP and PT  Neurologic Pain and light touch intact in extremities , Motor exam as listed above    Non-Invasive Vascular Imaging    4.6 cm right popliteal aneurysm  CTA aorta with runoff pending    Medical Decision Making   Dennis [name] is a 73 y.o. male who presents with: 4.6 cm right popliteal aneurysm   Based on new onset of symptoms, aneurysm is likely compressing nerve  No rest pain, toe discoloration, or other tissue changes to suggest thrombosis or embolic activity  Patient will be prescribed Neurontin  We will also attempt to check CTA aorta with runoff this week so patient may follow-up with Dr. Donnetta Hutching 1 week  earlier to discuss repair of popliteal aneurysm  He will also require a vein mapping of right leg when he returns to see Dr. Donnetta Hutching in preparation for surgery   Dagoberto Ligas PA-C [facility] Specialists of [place] Office: [phone]  Clinic MD: Dr. Trula Slade

## 2018-11-12 NOTE — Telephone Encounter (Signed)
Pt called and said that he is having a lot of pain behind his knee where his aneurysm is. He said that he would like to get something sent in for pain.   Called pt and advised him that all pain medications would have to have an appt. We do not write medications without a visit. He said that he was just really concerned about the aneurysm as he has not had this kind of pain in that area before and was worried it would rupture.   Appt made for pt to be seen today with PA. He does have an appt later this month but he said for peace of mind he would like to see someone to discuss.   York Cerise, CMA

## 2018-11-13 ENCOUNTER — Ambulatory Visit
Admission: RE | Admit: 2018-11-13 | Discharge: 2018-11-13 | Disposition: A | Discharge status: Home / Self Care | Payer: Medicare HMO | Source: Ambulatory Visit | Attending: Physician Assistant | Admitting: Physician Assistant

## 2018-11-13 DIAGNOSIS — I724 Aneurysm of artery of lower extremity: Secondary | ICD-10-CM | POA: Diagnosis not present

## 2018-11-13 DIAGNOSIS — I723 Aneurysm of iliac artery: Secondary | ICD-10-CM | POA: Diagnosis not present

## 2018-11-13 MED ORDER — IOPAMIDOL (ISOVUE-370) INJECTION 76%
125.0000 mL | Freq: Once | INTRAVENOUS | Status: AC | PRN
  Administered 2018-11-13: 125 mL via INTRAVENOUS

## 2018-11-16 DIAGNOSIS — I129 Hypertensive chronic kidney disease with stage 1 through stage 4 chronic kidney disease, or unspecified chronic kidney disease: Secondary | ICD-10-CM | POA: Diagnosis not present

## 2018-11-16 DIAGNOSIS — Z7189 Other specified counseling: Secondary | ICD-10-CM | POA: Diagnosis not present

## 2018-11-16 DIAGNOSIS — I209 Angina pectoris, unspecified: Secondary | ICD-10-CM | POA: Diagnosis not present

## 2018-11-16 DIAGNOSIS — E039 Hypothyroidism, unspecified: Secondary | ICD-10-CM | POA: Diagnosis not present

## 2018-11-16 DIAGNOSIS — I724 Aneurysm of artery of lower extremity: Secondary | ICD-10-CM | POA: Diagnosis not present

## 2018-11-16 DIAGNOSIS — I2510 Atherosclerotic heart disease of native coronary artery without angina pectoris: Secondary | ICD-10-CM | POA: Diagnosis not present

## 2018-11-16 DIAGNOSIS — Z0000 Encounter for general adult medical examination without abnormal findings: Secondary | ICD-10-CM | POA: Diagnosis not present

## 2018-11-16 DIAGNOSIS — E785 Hyperlipidemia, unspecified: Secondary | ICD-10-CM | POA: Diagnosis not present

## 2018-11-16 DIAGNOSIS — N182 Chronic kidney disease, stage 2 (mild): Secondary | ICD-10-CM | POA: Diagnosis not present

## 2018-11-16 DIAGNOSIS — R7309 Other abnormal glucose: Secondary | ICD-10-CM | POA: Diagnosis not present

## 2018-11-19 ENCOUNTER — Telehealth: Payer: Self-pay | Admitting: *Deleted

## 2018-11-20 ENCOUNTER — Ambulatory Visit (INDEPENDENT_AMBULATORY_CARE_PROVIDER_SITE_OTHER): Payer: Medicare HMO | Admitting: Vascular Surgery

## 2018-11-20 ENCOUNTER — Other Ambulatory Visit: Payer: Self-pay

## 2018-11-20 ENCOUNTER — Encounter: Payer: Self-pay | Admitting: Vascular Surgery

## 2018-11-20 VITALS — BP 130/78

## 2018-11-20 DIAGNOSIS — I724 Aneurysm of artery of lower extremity: Secondary | ICD-10-CM
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
## 2018-11-20 NOTE — Progress Notes (Signed)
Virtual Visit via Video Note   I connected with Dennis [name] on 11/20/2018 using the [employer] virtual platform and verified that I was speaking with the correct person using two identifiers. Patient was located at home and accompanied by his wife. I am located at [facility].   The limitations of evaluation and management by telemedicine and the availability of in person appointments have been previously discussed with the patient and are documented in the patients chart. The patient expressed understanding and consented to proceed.   PCP: Merrilee Seashore, MD   Chief Complaint: Popliteal artery aneurysm  History of Present Illness: Dennis [name] is a 73 y.o. 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  He subsequently underwent CT angiogram of his abdomen pelvis and runoff to rule out any other aneurysms.  He does report some numbness and tingling from his knee distally which may be nerve impingement.  He also reports some pain higher in his hip and thigh and explained this would not be attributable to his aneurysm  Past Medical History:  Diagnosis Date  . Anginal pain ([place])   . CAD (coronary artery disease)    a. 2002 Stent RCA & circumflex EF40-50%   . GERD (gastroesophageal reflux disease)   . Hyperlipidemia   . Hypertension   . Hypothyroidism   . Myocardial infarction ([place]) 03/2001  . Squamous cell carcinoma of scalp    "some burned, some cut off" (11/17/2016)  . Thyroid nodule, hot    s/p radioactive iodine, now hypothyroidism    Past Surgical History:  Procedure Laterality Date  . CARDIAC CATHETERIZATION  09/2014   "day before they put the stent in"  . CORONARY ANGIOPLASTY WITH STENT PLACEMENT  03/2001; 09/2014; 11/17/2016   "1+1+1"  . CORONARY STENT INTERVENTION N/A 11/17/2016   Procedure: Coronary Stent Intervention;  Surgeon: Lorretta Harp, MD;  Location: [place] [facility];  Service: Cardiovascular;  Laterality: N/A;  . INTRAVASCULAR PRESSURE WIRE/FFR STUDY N/A 11/17/2016   Procedure: Intravascular Pressure Wire/FFR Study;  Surgeon: Lorretta Harp, MD;  Location: [place] [facility];  Service: Cardiovascular;  Laterality: N/A;  . LEFT HEART CATH AND CORONARY ANGIOGRAPHY N/A 11/17/2016   Procedure: Left Heart Cath and Coronary Angiography;  Surgeon: Lorretta Harp, MD;  Location: [place] [facility];  Service: Cardiovascular;  Laterality: N/A;  . LEFT HEART CATHETERIZATION WITH CORONARY ANGIOGRAM N/A 09/30/2014   Procedure: LEFT HEART CATHETERIZATION WITH CORONARY ANGIOGRAM;  Surgeon: Troy Sine, MD;  Location: [facility] CATH LAB;  Service: Cardiovascular;  Laterality: N/A;  . PERCUTANEOUS CORONARY STENT INTERVENTION (PCI-S) N/A 10/02/2014   Procedure: PERCUTANEOUS CORONARY STENT INTERVENTION (PCI-S);  Surgeon: Burnell Blanks, MD;  Location: [facility] CATH LAB;  Service: Cardiovascular;  Laterality: N/A;  . SQUAMOUS CELL CARCINOMA EXCISION     "scalp"    No outpatient medications have been marked as taking for the 11/20/18 encounter (Office Visit) with Rosetta Posner, MD.    12 system ROS was negative unless otherwise noted in HPI  Observations/Objective: CT scan was reviewed and discussed with the patient and his wife.  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  He does not have any particular arteriomegaly.  No aneurysms in his aorta, iliac or femoral systems.  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  Recommended surgery for  repair of this.  I did discuss stent graft but recommend against this with his good health and this large aneurysm.  Discussed the technique to include exclusion of the aneurysm with above-knee to below-knee popliteal bypass.  Also discussed potential use of saphenous vein harvested from his thigh versus prosthetic graft if he does not have adequate size  of saphenous vein  Follow Up Instructions:  Follow up We will schedule elective surgery at his earliest convenience   I discussed the assessment and treatment plan with the patient. The patient was provided an opportunity to ask questions and all were answered. The patient agreed with the plan and demonstrated an understanding of the instructions.   The patient was advised to call back or seek an in-person evaluation if the symptoms worsen or if the condition fails to improve as anticipated.  Annamary Rummage [facility] Office: [phone]  11/20/2018, 4:35 PM

## 2018-11-21 DIAGNOSIS — Z1211 Encounter for screening for malignant neoplasm of colon: Secondary | ICD-10-CM | POA: Diagnosis not present

## 2018-11-21 DIAGNOSIS — Z1212 Encounter for screening for malignant neoplasm of rectum: Secondary | ICD-10-CM | POA: Diagnosis not present

## 2018-11-22 ENCOUNTER — Telehealth: Payer: Self-pay | Admitting: *Deleted

## 2018-11-22 NOTE — Telephone Encounter (Signed)
Left message for patient to call this office back to schedule surgery.

## 2018-11-23 ENCOUNTER — Other Ambulatory Visit: Payer: Medicare HMO

## 2018-11-23 ENCOUNTER — Other Ambulatory Visit: Payer: Self-pay | Admitting: *Deleted

## 2018-11-23 NOTE — Progress Notes (Signed)
Call to patient with instructions for 11/30/2018 surgery with Dr. Donnetta Hutching. Nasal swab at [facility] scheduled for 10:40 on 11/27/2018. All information given. Instructed to be at [facility] admitting at 7:15 am day of surgery. NPO past MN. Expect a call and follow the detailed surgery instructions received from the hospital pre-admission department. HOLD Plavix for 5 days piror to surgery. Continue ASA. Verbalized understanding. To call this office if questions.

## 2018-11-27 ENCOUNTER — Other Ambulatory Visit (HOSPITAL_COMMUNITY)
Admission: RE | Admit: 2018-11-27 | Discharge: 2018-11-27 | Disposition: A | Discharge status: Home / Self Care | Payer: Medicare HMO | Source: Ambulatory Visit | Attending: Vascular Surgery | Admitting: Vascular Surgery

## 2018-11-27 ENCOUNTER — Ambulatory Visit: Payer: Medicare HMO | Admitting: Vascular Surgery

## 2018-11-27 DIAGNOSIS — Z1159 Encounter for screening for other viral diseases: Secondary | ICD-10-CM | POA: Insufficient documentation

## 2018-11-27 LAB — SARS CORONAVIRUS 2 (TAT 6-24 HRS): SARS Coronavirus 2: NEGATIVE

## 2018-11-27 NOTE — Progress Notes (Signed)
Anesthesia Chart Review:  Case: [id number] Date/Time: 11/30/18 0914   Procedure: RIGHT POPLITEAL ARTERY ANEURYSM REPAIR (Right )   Anesthesia type: General   Pre-op diagnosis: POPLITEAL ARTERY ANEURYSM RIGHT LEG   Location: [facility] OR ROOM 11 / [facility]   Surgeon: Rosetta Posner, MD      DISCUSSION: Patient is a 73 year old male scheduled for the above procedure.  History includes former smoker (quit 2002), HTN, CAD (inferior STEMI s/p RCA stent 03/19/01; DES CX 10/02/14, 11/17/16), HLD, GERD, post-RAI hypothyroidism, skin cancer (SCC, s/p excision). BMI is consistent with obesity.   Patient last seen by cardiologist Dr. Gwenlyn Found 06/2018 with one year follow-up recommended. He denied chest pain, SOB, fever at PAT RN visit.   Per [facility], hold Plavix for 5 days (last dose 11/24/18) but continue ASA perioperatively. 11/27/18 presurgical COVID-19 test was negative. Based on available information, I would anticipate that he can proceed as planned if no acute changes.   VS: BP (!) 142/80   Pulse 63   Temp 36.5 C   Resp 20   Ht 5\' 8"  (1.727 m)   Wt 98.3 kg   SpO2 95%   BMI 32.96 kg/m   PROVIDERS: Merrilee Seashore, MD is PCP [name], MD is cardiologist. Last visit 06/12/18 following 06/07/18 ED visit for atypical chest pain (lasting a few seconds) in the setting of significant HTN (180's/110's). MI ruled out. Discharged from ED on amlodipine with cardiology follow-up. No recurrent symptoms at follow-up visit and no ischemic testing ordered. One year cardiology follow-up recommended; however, he did refer patient to Tommy Medal, PharmD, CPP at the [facility] for HTN medication titration. Last visit with her on 07/13/18.    LABS: Labs reviewed: Acceptable for surgery. (all labs ordered are listed, but only abnormal results are displayed)  Labs Reviewed  COMPREHENSIVE METABOLIC PANEL - Abnormal; Notable for the following components:      Result Value   Glucose, Bld 108 (*)    Creatinine, Ser 1.26 (*)    GFR calc non Af Amer 56 (*)    All other components within normal limits  SURGICAL PCR SCREEN  APTT  CBC  PROTIME-INR  URINALYSIS, ROUTINE W REFLEX MICROSCOPIC  TYPE AND SCREEN  ABO/RH    IMAGES: CTA Ao+Bifem 11/13/18: IMPRESSION: VASCULAR 1. 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 2. Saccular 5 x 4.1 cm aneurysm of the proximal RIGHT popliteal artery. 3. 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 4. 1.8 cm fusiform aneurysm of the proximal LEFT popliteal artery. NON-VASCULAR: 1. No acute findings. 2. Multiple bilateral renal lesions probably cysts but incompletely characterized. In the absence of prior studies demonstrating stability, renal protocol MRI or CT without and without contrast recommended for more complete characterization, to exclude neoplasm. 3. Small hiatal hernia.   CXR 06/07/18: FINDINGS: Stable cardiac size at the upper limits of normal. Other mediastinal contours are within normal limits. Visualized tracheal air column is within normal limits. Both lungs are clear. No pneumothorax or pleural effusion. No acute osseous abnormality identified. Negative visible bowel gas pattern. IMPRESSION: No acute cardiopulmonary abnormality.   EKG: 06/07/18: Sinus rhythm RBBB and LAFB no significant change since earlier in the day Confirmed by Sherwood Gambler [id number]) on 06/07/2018 3:36:41 PM   CV: Cardiac cath 11/17/16:  Ost RCA to Prox RCA lesion, 40 %stenosed.  Prox Cx to Mid Cx lesion, 0 %stenosed.   Mid Cx lesion, 70 %stenosed. Successful FFR guided mid AV  groove circumflex PCI and drug-eluting stent of a 70% lesion just beyond the previously stented segment. Post intervention, there is a 0% residual stenosis.  The left ventricular systolic function is normal.  LV end diastolic pressure is normal.  The left ventricular ejection fraction is 55-65% by visual estimate.   Past Medical  History:  Diagnosis Date  . Anginal pain ([place])   . CAD (coronary artery disease)    a. 2002 Stent RCA & circumflex EF40-50%   . GERD (gastroesophageal reflux disease)   . Hyperlipidemia   . Hypertension   . Hypothyroidism   . Myocardial infarction ([place]) 03/2001  . Squamous cell carcinoma of scalp    "some burned, some cut off" (11/17/2016)  . Thyroid nodule, hot    s/p radioactive iodine, now hypothyroidism    Past Surgical History:  Procedure Laterality Date  . CARDIAC CATHETERIZATION  09/2014   "day before they put the stent in"  . CORONARY ANGIOPLASTY WITH STENT PLACEMENT  03/2001; 09/2014; 11/17/2016   "1+1+1"  . CORONARY STENT INTERVENTION N/A 11/17/2016   Procedure: Coronary Stent Intervention;  Surgeon: Lorretta Harp, MD;  Location: [place] [facility];  Service: Cardiovascular;  Laterality: N/A;  . INTRAVASCULAR PRESSURE WIRE/FFR STUDY N/A 11/17/2016   Procedure: Intravascular Pressure Wire/FFR Study;  Surgeon: Lorretta Harp, MD;  Location: [place] [facility];  Service: Cardiovascular;  Laterality: N/A;  . LEFT HEART CATH AND CORONARY ANGIOGRAPHY N/A 11/17/2016   Procedure: Left Heart Cath and Coronary Angiography;  Surgeon: Lorretta Harp, MD;  Location: [place] [facility];  Service: Cardiovascular;  Laterality: N/A;  . LEFT HEART CATHETERIZATION WITH CORONARY ANGIOGRAM N/A 09/30/2014   Procedure: LEFT HEART CATHETERIZATION WITH CORONARY ANGIOGRAM;  Surgeon: Troy Sine, MD;  Location: [facility] CATH LAB;  Service: Cardiovascular;  Laterality: N/A;  . PERCUTANEOUS CORONARY STENT INTERVENTION (PCI-S) N/A 10/02/2014   Procedure: PERCUTANEOUS CORONARY STENT INTERVENTION (PCI-S);  Surgeon: Burnell Blanks, MD;  Location: [facility] CATH LAB;  Service: Cardiovascular;  Laterality: N/A;  . SQUAMOUS CELL CARCINOMA EXCISION     "scalp"  . WISDOM TOOTH EXTRACTION      MEDICATIONS: . amLODipine (NORVASC) 5 MG tablet  . ascorbic acid (VITAMIN C) 1000 MG tablet  . aspirin 81 MG  tablet  . Cholecalciferol (VITAMIN D) 50 MCG (2000 UT) tablet  . clopidogrel (PLAVIX) 75 MG tablet  . fenofibrate 160 MG tablet  . furosemide (LASIX) 20 MG tablet  . gabapentin (NEURONTIN) 300 MG capsule  . levothyroxine (SYNTHROID, LEVOTHROID) 112 MCG tablet  . magnesium oxide (MAG-OX) 400 MG tablet  . metoprolol succinate (TOPROL-XL) 100 MG 24 hr tablet  . nitroGLYCERIN (NITROSTAT) 0.4 MG SL tablet  . omega-3 acid ethyl esters (LOVAZA) 1 g capsule  . Omeprazole-Sodium Bicarbonate (ZEGERID OTC) 20-1100 MG CAPS capsule  . ramipril (ALTACE) 10 MG capsule   No current facility-administered medications for this encounter.     Myra Gianotti, PA-C Surgical Short Stay/Anesthesiology [facility] Phone [phone] [facility] Phone [phone] 11/28/2018 7:31 PM

## 2018-11-28 ENCOUNTER — Encounter (HOSPITAL_COMMUNITY)
Admission: RE | Admit: 2018-11-28 | Discharge: 2018-11-28 | Disposition: A | Discharge status: Home / Self Care | Payer: Medicare HMO | Source: Ambulatory Visit | Attending: Vascular Surgery | Admitting: Vascular Surgery

## 2018-11-28 ENCOUNTER — Other Ambulatory Visit: Payer: Self-pay

## 2018-11-28 ENCOUNTER — Encounter (HOSPITAL_COMMUNITY): Payer: Self-pay

## 2018-11-28 LAB — COMPREHENSIVE METABOLIC PANEL
ALT: 17 U/L (ref 0–44)
AST: 16 U/L (ref 15–41)
Albumin: 4 g/dL (ref 3.5–5.0)
Alkaline Phosphatase: 46 U/L (ref 38–126)
Anion gap: 10 (ref 5–15)
BUN: 20 mg/dL (ref 8–23)
CO2: 23 mmol/L (ref 22–32)
Calcium: 9.5 mg/dL (ref 8.9–10.3)
Chloride: 108 mmol/L (ref 98–111)
Creatinine, Ser: 1.26 mg/dL — ABNORMAL HIGH (ref 0.61–1.24)
GFR calc Af Amer: >60 mL/min (ref >60)
GFR calc non Af Amer: 56 mL/min — ABNORMAL LOW (ref >60)
Glucose, Bld: 108 mg/dL — ABNORMAL HIGH (ref 70–99)
Potassium: 3.7 mmol/L (ref 3.5–5.1)
Sodium: 141 mmol/L (ref 135–145)
Total Bilirubin: 0.6 mg/dL (ref 0.3–1.2)
Total Protein: 7.1 g/dL (ref 6.5–8.1)

## 2018-11-28 LAB — TYPE AND SCREEN
ABO/RH(D): O POS
Antibody Screen: NEGATIVE

## 2018-11-28 LAB — CBC
HCT: 44.4 % (ref 39.0–52.0)
Hemoglobin: 14.4 g/dL (ref 13.0–17.0)
MCH: 29.4 pg (ref 26.0–34.0)
MCHC: 32.4 g/dL (ref 30.0–36.0)
MCV: 90.6 fL (ref 80.0–100.0)
Platelets: 231 10*3/uL (ref 150–400)
RBC: 4.9 MIL/uL (ref 4.22–5.81)
RDW: 13.2 % (ref 11.5–15.5)
WBC: 6.4 10*3/uL (ref 4.0–10.5)
nRBC: 0 % (ref 0.0–0.2)

## 2018-11-28 LAB — URINALYSIS, ROUTINE W REFLEX MICROSCOPIC
Bilirubin Urine: NEGATIVE
Glucose, UA: NEGATIVE mg/dL
Hgb urine dipstick: NEGATIVE
Ketones, ur: NEGATIVE mg/dL
Leukocytes,Ua: NEGATIVE
Nitrite: NEGATIVE
Protein, ur: NEGATIVE mg/dL
Specific Gravity, Urine: 1.016 (ref 1.005–1.030)
pH: 6 (ref 5.0–8.0)

## 2018-11-28 LAB — PROTIME-INR
INR: 1.1 (ref 0.8–1.2)
Prothrombin Time: 13.7 seconds (ref 11.4–15.2)

## 2018-11-28 LAB — APTT: aPTT: 27 seconds (ref 24–36)

## 2018-11-28 LAB — ABO/RH: ABO/RH(D): O POS

## 2018-11-28 LAB — SURGICAL PCR SCREEN
MRSA, PCR: NEGATIVE
Staphylococcus aureus: NEGATIVE

## 2018-11-28 NOTE — Pre-Procedure Instructions (Signed)
[place], [place] - [address] [place] [place] [address] Phone: [phone] Fax: [phone]      Your procedure is scheduled on  11-30-18 Friday  Report to [facility] Main Entrance "A" at 0730 A.M., and check in at the Admitting office.  Call this number if you have problems the morning of surgery:  [phone]  Call [phone] if you have any questions prior to your surgery date Monday-Friday 8am-4pm    Remember:  Do not eat or drink after midnight.  Take these medicines the morning of surgery with A SIP OF WATER : amLODipine (NORVASC) Fenofibrate levothyroxine (SYNTHROID, LEVOTHROID) metoprolol succinate (TOPROL-XL) gabapentin (NEURONTIN) as needed nitroGLYCERIN (NITROSTAT) as needed  Follow your surgeon's instructions on when to hold Plavix.  If no instructions were given by your surgeon then you will need to call the office to get those instructions.     Follow your surgeon's instructions on when to stop Aspirin.  If no instructions were given by your surgeon then you will need to call the office to get those instructions.    As of today, stop taking any Aleve, Naproxen, Ibuprofen, Motrin, Advil, Goody's, BC's, all herbal medications, fish oil, and all vitamins.   The Morning of Surgery  Do not wear jewelry.  Do not wear lotions, powders, or colognes, or deodorant             Men may shave face and neck.  Do not bring valuables to the hospital.  [facility] is not responsible for any belongings or valuables.  If you are a smoker, DO NOT Smoke 24 hours prior to surgery IF you wear a CPAP at night please bring your mask, tubing, and machine the morning of surgery   Remember that you must have someone to transport you home after your surgery, and remain with you for 24 hours if you are discharged the same day.   Contacts, glasses, hearing aids, dentures or bridgework may not be worn into surgery.    Leave  your suitcase in the car.  After surgery it may be brought to your room.  For patients admitted to the hospital, discharge time will be determined by your treatment team.  Patients discharged the day of surgery will not be allowed to drive home.    Special instructions:   [place]- Preparing For Surgery  Before surgery, you can play an important role. Because skin is not sterile, your skin needs to be as free of germs as possible. You can reduce the number of germs on your skin by washing with CHG (chlorahexidine gluconate) Soap before surgery.  CHG is an antiseptic cleaner which kills germs and bonds with the skin to continue killing germs even after washing.    Oral Hygiene is also important to reduce your risk of infection.  Remember - BRUSH YOUR TEETH THE MORNING OF SURGERY WITH YOUR REGULAR TOOTHPASTE  Please do not use if you have an allergy to CHG or antibacterial soaps. If your skin becomes reddened/irritated stop using the CHG.  Do not shave (including legs and underarms) for at least 48 hours prior to first CHG shower. It is OK to shave your face.  Please follow these instructions carefully.   1. Shower the NIGHT BEFORE SURGERY and the MORNING OF SURGERY with CHG Soap.   2. If you chose to wash your hair, wash your hair first as usual with your normal shampoo.  3. After you shampoo, rinse your  hair and body thoroughly to remove the shampoo.  4. Use CHG as you would any other liquid soap. You can apply CHG directly to the skin and wash gently with a scrungie or a clean washcloth.   5. Apply the CHG Soap to your body ONLY FROM THE NECK DOWN.  Do not use on open wounds or open sores. Avoid contact with your eyes, ears, mouth and genitals (private parts). Wash Face and genitals (private parts)  with your normal soap.   6. Wash thoroughly, paying special attention to the area where your surgery will be performed.  7. Thoroughly rinse your body with warm water from the neck  down.  8. DO NOT shower/wash with your normal soap after using and rinsing off the CHG Soap.  9. Pat yourself dry with a CLEAN TOWEL.  10. Wear CLEAN PAJAMAS to bed the night before surgery, wear comfortable clothes the morning of surgery  11. Place CLEAN SHEETS on your bed the night of your first shower and DO NOT SLEEP WITH PETS.    Day of Surgery:  Do not apply any deodorants/lotions.  Please wear clean clothes to the hospital/surgery center.   Remember to brush your teeth WITH YOUR REGULAR TOOTHPASTE.   Please read over the following fact sheets that you were given.

## 2018-11-28 NOTE — Progress Notes (Signed)
PCP - Dr. Ashby Dawes  Cardiologist - Dr. Alvester Chou  Chest x-ray - 06/07/2018 (E)  EKG - 06/08/2018 (E)  Stress Test - Denies  ECHO - Denies  Cardiac Cath - 11/17/16 (E)  AICD-na PM-na LOOP-na  Sleep Study - Denies CPAP - None  LABS- 11/28/2018: CBC, CMP, PT, PTT, T/S, UA, PCR  ASA- Continue Plavix- 6/20  ERAS- No  Anesthesia- Yes- cardiac history  Pt denies having chest pain, sob, or fever at this time. All instructions explained to the pt, with a verbal understanding of the material. Pt agrees to go over the instructions while at home for a better understanding. The opportunity to ask questions was provided.   Coronavirus Screening  Have you experienced the following symptoms:  Cough yes/no: No Fever (>100.41F)  yes/no: No Runny nose yes/no: No Sore throat yes/no: No Difficulty breathing/shortness of breath  yes/no: No  Have you or a family member traveled in the last 14 days and where? yes/no: No   If the patient indicates "YES" to the above questions, their PAT will be rescheduled to limit the exposure to others and, the surgeon will be notified. THE PATIENT WILL NEED TO BE ASYMPTOMATIC FOR 14 DAYS.   If the patient is not experiencing any of these symptoms, the PAT nurse will instruct them to NOT bring anyone with them to their appointment since they may have these symptoms or traveled as well.   Please remind your patients and families that hospital visitation restrictions are in effect and the importance of the restrictions.

## 2018-11-28 NOTE — Anesthesia Preprocedure Evaluation (Addendum)
Anesthesia Evaluation  Patient identified by MRN, date of birth, ID band Patient awake    Reviewed: Allergy & Precautions, NPO status , Patient's Chart, lab work & pertinent test results, reviewed documented beta blocker date and time   History of Anesthesia Complications Negative for: history of anesthetic complications  Airway Mallampati: I  TM Distance: >3 FB Neck ROM: Full    Dental  (+) Edentulous Upper, Poor Dentition, Dental Advisory Given   Pulmonary former smoker (quit 2002),  11/27/2018 SARS coronavirus neg   breath sounds clear to auscultation       Cardiovascular hypertension, Pt. on medications and Pt. on home beta blockers (-) angina+ CAD, + Past MI and + Cardiac Stents   Rhythm:Regular Rate:Normal  '18 cath: Ost RCA to Prox RCA lesion, 40 %stenosed.   Prox Cx to Mid Cx lesion, 0 %stenosed.   Mid Cx lesion, 70 %stenosed.   Post intervention, there is a 0% residual stenosis.   A stent was successfully placed.   The left ventricular systolic function is normal.   The left ventricular ejection fraction is 55-65%    Neuro/Psych negative neurological ROS     GI/Hepatic negative GI ROS, Neg liver ROS,   Endo/Other  Hypothyroidism Morbid obesity  Renal/GU Renal InsufficiencyRenal disease (1.26)     Musculoskeletal   Abdominal (+) + obese,   Peds  Hematology negative hematology ROS (+)   Anesthesia Other Findings   Reproductive/Obstetrics                            Anesthesia Physical Anesthesia Plan  ASA: III  Anesthesia Plan: General   Post-op Pain Management:    Induction: Intravenous  PONV Risk Score and Plan: 2 and Ondansetron and Dexamethasone  Airway Management Planned: Oral ETT  Additional Equipment:   Intra-op Plan:   Post-operative Plan: Extubation in OR  Informed Consent: I have reviewed the patients History and Physical, chart, labs and discussed  the procedure including the risks, benefits and alternatives for the proposed anesthesia with the patient or authorized representative who has indicated his/her understanding and acceptance.     Dental advisory given  Plan Discussed with: CRNA and Surgeon  Anesthesia Plan Comments: (PAT note written 11/28/2018 by Myra Gianotti, PA-C. )       Anesthesia Quick Evaluation

## 2018-11-30 ENCOUNTER — Inpatient Hospital Stay (HOSPITAL_COMMUNITY): Payer: Medicare HMO | Admitting: Vascular Surgery

## 2018-11-30 ENCOUNTER — Encounter (HOSPITAL_COMMUNITY)
Admission: RE | Disposition: A | Discharge status: Home / Self Care | Payer: Self-pay | Source: Home / Self Care | Attending: Vascular Surgery

## 2018-11-30 ENCOUNTER — Inpatient Hospital Stay (HOSPITAL_COMMUNITY): Payer: Medicare HMO | Admitting: Certified Registered Nurse Anesthetist

## 2018-11-30 ENCOUNTER — Encounter (HOSPITAL_COMMUNITY): Payer: Self-pay

## 2018-11-30 ENCOUNTER — Inpatient Hospital Stay (HOSPITAL_COMMUNITY)
Admission: RE | Admit: 2018-11-30 | Discharge: 2018-12-02 | DRG: 254 | Disposition: A | Discharge status: Home / Self Care | Payer: Medicare HMO | Attending: Vascular Surgery | Admitting: Vascular Surgery

## 2018-11-30 ENCOUNTER — Other Ambulatory Visit: Payer: Self-pay

## 2018-11-30 DIAGNOSIS — Z1159 Encounter for screening for other viral diseases: Secondary | ICD-10-CM

## 2018-11-30 DIAGNOSIS — K219 Gastro-esophageal reflux disease without esophagitis: Secondary | ICD-10-CM | POA: Diagnosis not present

## 2018-11-30 DIAGNOSIS — Z7982 Long term (current) use of aspirin: Secondary | ICD-10-CM

## 2018-11-30 DIAGNOSIS — E785 Hyperlipidemia, unspecified: Secondary | ICD-10-CM | POA: Diagnosis present

## 2018-11-30 DIAGNOSIS — Z79890 Hormone replacement therapy (postmenopausal): Secondary | ICD-10-CM | POA: Diagnosis not present

## 2018-11-30 DIAGNOSIS — Z955 Presence of coronary angioplasty implant and graft: Secondary | ICD-10-CM | POA: Diagnosis not present

## 2018-11-30 DIAGNOSIS — Z6832 Body mass index (BMI) 32.0-32.9, adult: Secondary | ICD-10-CM | POA: Diagnosis not present

## 2018-11-30 DIAGNOSIS — R2689 Other abnormalities of gait and mobility: Secondary | ICD-10-CM | POA: Diagnosis not present

## 2018-11-30 DIAGNOSIS — I252 Old myocardial infarction: Secondary | ICD-10-CM

## 2018-11-30 DIAGNOSIS — Z79899 Other long term (current) drug therapy: Secondary | ICD-10-CM

## 2018-11-30 DIAGNOSIS — Z85828 Personal history of other malignant neoplasm of skin: Secondary | ICD-10-CM | POA: Diagnosis not present

## 2018-11-30 DIAGNOSIS — I724 Aneurysm of artery of lower extremity: Secondary | ICD-10-CM | POA: Diagnosis not present

## 2018-11-30 DIAGNOSIS — I2510 Atherosclerotic heart disease of native coronary artery without angina pectoris: Secondary | ICD-10-CM | POA: Diagnosis not present

## 2018-11-30 DIAGNOSIS — I10 Essential (primary) hypertension: Secondary | ICD-10-CM | POA: Diagnosis not present

## 2018-11-30 DIAGNOSIS — E039 Hypothyroidism, unspecified: Secondary | ICD-10-CM | POA: Diagnosis not present

## 2018-11-30 HISTORY — PX: BYPASS GRAFT POPLITEAL TO POPLITEAL: SHX5763

## 2018-11-30 LAB — CBC
HCT: 43.7 % (ref 39.0–52.0)
Hemoglobin: 14.1 g/dL (ref 13.0–17.0)
MCH: 29.4 pg (ref 26.0–34.0)
MCHC: 32.3 g/dL (ref 30.0–36.0)
MCV: 91.2 fL (ref 80.0–100.0)
Platelets: 214 10*3/uL (ref 150–400)
RBC: 4.79 MIL/uL (ref 4.22–5.81)
RDW: 13.2 % (ref 11.5–15.5)
WBC: 10.6 10*3/uL — ABNORMAL HIGH (ref 4.0–10.5)
nRBC: 0 % (ref 0.0–0.2)

## 2018-11-30 LAB — CREATININE, SERUM
Creatinine, Ser: 1.24 mg/dL (ref 0.61–1.24)
GFR calc Af Amer: >60 mL/min (ref >60)
GFR calc non Af Amer: 57 mL/min — ABNORMAL LOW (ref >60)

## 2018-11-30 SURGERY — CREATION, BYPASS, ARTERIAL, POPLITEAL
Anesthesia: General | Laterality: Right

## 2018-11-30 MED ORDER — ROCURONIUM BROMIDE 10 MG/ML (PF) SYRINGE
PREFILLED_SYRINGE | INTRAVENOUS | Status: AC
  Filled 2018-11-30: qty 10

## 2018-11-30 MED ORDER — HEPARIN SODIUM (PORCINE) 1000 UNIT/ML IJ SOLN
INTRAMUSCULAR | Status: DC | PRN
  Administered 2018-11-30: 9000 [IU] via INTRAVENOUS

## 2018-11-30 MED ORDER — HEPARIN SODIUM (PORCINE) 5000 UNIT/ML IJ SOLN
5000.0000 [IU] | Freq: Three times a day (TID) | INTRAMUSCULAR | Status: DC
  Administered 2018-12-01 – 2018-12-02 (×3): 5000 [IU] via SUBCUTANEOUS
  Filled 2018-11-30 (×3): qty 1

## 2018-11-30 MED ORDER — ASPIRIN EC 81 MG PO TBEC
81.0000 mg | DELAYED_RELEASE_TABLET | Freq: Every evening | ORAL | Status: DC
  Administered 2018-12-01: 81 mg via ORAL
  Filled 2018-11-30 (×2): qty 1

## 2018-11-30 MED ORDER — CHLORHEXIDINE GLUCONATE 4 % EX LIQD: 60.0000 mL | Freq: Once | CUTANEOUS | Status: DC

## 2018-11-30 MED ORDER — ALUM & MAG HYDROXIDE-SIMETH 200-200-20 MG/5ML PO SUSP: 15.0000 mL | ORAL | Status: DC | PRN

## 2018-11-30 MED ORDER — MEPERIDINE HCL 25 MG/ML IJ SOLN: 6.2500 mg | INTRAMUSCULAR | Status: DC | PRN

## 2018-11-30 MED ORDER — MORPHINE SULFATE (PF) 2 MG/ML IV SOLN
2.0000 mg | INTRAVENOUS | Status: DC | PRN
  Administered 2018-11-30 – 2018-12-01 (×3): 2 mg via INTRAVENOUS
  Filled 2018-11-30 (×3): qty 1

## 2018-11-30 MED ORDER — PROMETHAZINE HCL 25 MG/ML IJ SOLN: 6.2500 mg | INTRAMUSCULAR | Status: DC | PRN

## 2018-11-30 MED ORDER — HEPARIN SODIUM (PORCINE) 1000 UNIT/ML IJ SOLN
INTRAMUSCULAR | Status: AC
  Filled 2018-11-30: qty 1

## 2018-11-30 MED ORDER — MAGNESIUM SULFATE 2 GM/50ML IV SOLN: 2.0000 g | Freq: Every day | INTRAVENOUS | Status: DC | PRN

## 2018-11-30 MED ORDER — PANTOPRAZOLE SODIUM 40 MG PO TBEC
40.0000 mg | DELAYED_RELEASE_TABLET | Freq: Every day | ORAL | Status: DC
  Administered 2018-12-01 – 2018-12-02 (×2): 40 mg via ORAL
  Filled 2018-11-30 (×2): qty 1

## 2018-11-30 MED ORDER — SODIUM CHLORIDE 0.9 % IV SOLN
INTRAVENOUS | Status: DC | PRN
  Administered 2018-11-30: 25 ug/min via INTRAVENOUS

## 2018-11-30 MED ORDER — BISACODYL 10 MG RE SUPP: 10.0000 mg | Freq: Every day | RECTAL | Status: DC | PRN

## 2018-11-30 MED ORDER — FENTANYL CITRATE (PF) 100 MCG/2ML IJ SOLN: 25.0000 ug | INTRAMUSCULAR | Status: DC | PRN

## 2018-11-30 MED ORDER — 0.9 % SODIUM CHLORIDE (POUR BTL) OPTIME
TOPICAL | Status: DC | PRN
  Administered 2018-11-30 (×2): 1000 mL

## 2018-11-30 MED ORDER — VITAMIN C 500 MG PO TABS
1000.0000 mg | ORAL_TABLET | Freq: Every day | ORAL | Status: DC
  Administered 2018-12-01 – 2018-12-02 (×2): 1000 mg via ORAL
  Filled 2018-11-30 (×2): qty 2

## 2018-11-30 MED ORDER — RAMIPRIL 10 MG PO CAPS
10.0000 mg | ORAL_CAPSULE | Freq: Every evening | ORAL | Status: DC
  Administered 2018-11-30 – 2018-12-01 (×2): 10 mg via ORAL
  Filled 2018-11-30 (×2): qty 1

## 2018-11-30 MED ORDER — LIDOCAINE 2% (20 MG/ML) 5 ML SYRINGE
INTRAMUSCULAR | Status: AC
  Filled 2018-11-30: qty 5

## 2018-11-30 MED ORDER — LIDOCAINE 2% (20 MG/ML) 5 ML SYRINGE
INTRAMUSCULAR | Status: DC | PRN
  Administered 2018-11-30: 40 mg via INTRAVENOUS

## 2018-11-30 MED ORDER — ACETAMINOPHEN 325 MG PO TABS
325.0000 mg | ORAL_TABLET | ORAL | Status: DC | PRN
  Administered 2018-12-01: 650 mg via ORAL
  Filled 2018-11-30: qty 2

## 2018-11-30 MED ORDER — PHENYLEPHRINE 40 MCG/ML (10ML) SYRINGE FOR IV PUSH (FOR BLOOD PRESSURE SUPPORT)
PREFILLED_SYRINGE | INTRAVENOUS | Status: AC
  Filled 2018-11-30: qty 10

## 2018-11-30 MED ORDER — PROPOFOL 10 MG/ML IV BOLUS
INTRAVENOUS | Status: DC | PRN
  Administered 2018-11-30: 120 mg via INTRAVENOUS

## 2018-11-30 MED ORDER — PHENOL 1.4 % MT LIQD: 1.0000 | OROMUCOSAL | Status: DC | PRN

## 2018-11-30 MED ORDER — FENTANYL CITRATE (PF) 250 MCG/5ML IJ SOLN
INTRAMUSCULAR | Status: AC
  Filled 2018-11-30: qty 5

## 2018-11-30 MED ORDER — HYDRALAZINE HCL 20 MG/ML IJ SOLN: 5.0000 mg | INTRAMUSCULAR | Status: DC | PRN

## 2018-11-30 MED ORDER — OMEGA-3-ACID ETHYL ESTERS 1 G PO CAPS
1.0000 g | ORAL_CAPSULE | Freq: Every day | ORAL | Status: DC
  Administered 2018-12-01 – 2018-12-02 (×2): 1 g via ORAL
  Filled 2018-11-30 (×2): qty 1

## 2018-11-30 MED ORDER — GUAIFENESIN-DM 100-10 MG/5ML PO SYRP: 15.0000 mL | ORAL_SOLUTION | ORAL | Status: DC | PRN

## 2018-11-30 MED ORDER — METOPROLOL SUCCINATE ER 50 MG PO TB24
150.0000 mg | ORAL_TABLET | Freq: Every day | ORAL | Status: DC
  Administered 2018-12-01 – 2018-12-02 (×2): 150 mg via ORAL
  Filled 2018-11-30 (×2): qty 1

## 2018-11-30 MED ORDER — MIDAZOLAM HCL 2 MG/2ML IJ SOLN
INTRAMUSCULAR | Status: AC
  Filled 2018-11-30: qty 2

## 2018-11-30 MED ORDER — FUROSEMIDE 20 MG PO TABS: 20.0000 mg | ORAL_TABLET | Freq: Every day | ORAL | Status: DC | PRN

## 2018-11-30 MED ORDER — AMLODIPINE BESYLATE 5 MG PO TABS
5.0000 mg | ORAL_TABLET | Freq: Every day | ORAL | Status: DC
  Administered 2018-12-01 – 2018-12-02 (×2): 5 mg via ORAL
  Filled 2018-11-30 (×2): qty 1

## 2018-11-30 MED ORDER — LACTATED RINGERS IV SOLN
INTRAVENOUS | Status: DC
  Administered 2018-11-30 (×2): via INTRAVENOUS

## 2018-11-30 MED ORDER — EPHEDRINE 5 MG/ML INJ
INTRAVENOUS | Status: AC
  Filled 2018-11-30: qty 10

## 2018-11-30 MED ORDER — ONDANSETRON HCL 4 MG/2ML IJ SOLN
INTRAMUSCULAR | Status: DC | PRN
  Administered 2018-11-30: 4 mg via INTRAVENOUS

## 2018-11-30 MED ORDER — ROCURONIUM BROMIDE 100 MG/10ML IV SOLN
INTRAVENOUS | Status: DC | PRN
  Administered 2018-11-30: 70 mg via INTRAVENOUS

## 2018-11-30 MED ORDER — ACETAMINOPHEN 325 MG RE SUPP: 325.0000 mg | RECTAL | Status: DC | PRN

## 2018-11-30 MED ORDER — SODIUM CHLORIDE 0.9 % IV SOLN: INTRAVENOUS | Status: DC

## 2018-11-30 MED ORDER — MIDAZOLAM HCL 2 MG/2ML IJ SOLN: 0.5000 mg | Freq: Once | INTRAMUSCULAR | Status: DC | PRN

## 2018-11-30 MED ORDER — LABETALOL HCL 5 MG/ML IV SOLN: 10.0000 mg | INTRAVENOUS | Status: DC | PRN

## 2018-11-30 MED ORDER — VITAMIN D 25 MCG (1000 UNIT) PO TABS
4000.0000 [IU] | ORAL_TABLET | Freq: Every day | ORAL | Status: DC
  Filled 2018-11-30: qty 4

## 2018-11-30 MED ORDER — FENTANYL CITRATE (PF) 100 MCG/2ML IJ SOLN
INTRAMUSCULAR | Status: DC | PRN
  Administered 2018-11-30 (×4): 50 ug via INTRAVENOUS

## 2018-11-30 MED ORDER — MAGNESIUM OXIDE 400 (241.3 MG) MG PO TABS
400.0000 mg | ORAL_TABLET | Freq: Every day | ORAL | Status: DC
  Administered 2018-12-01 – 2018-12-02 (×2): 400 mg via ORAL
  Filled 2018-11-30 (×2): qty 1

## 2018-11-30 MED ORDER — SODIUM CHLORIDE 0.9 % IV SOLN
INTRAVENOUS | Status: DC | PRN
  Administered 2018-11-30: 500 mL

## 2018-11-30 MED ORDER — ONDANSETRON HCL 4 MG/2ML IJ SOLN: 4.0000 mg | Freq: Four times a day (QID) | INTRAMUSCULAR | Status: DC | PRN

## 2018-11-30 MED ORDER — CLOPIDOGREL BISULFATE 75 MG PO TABS
75.0000 mg | ORAL_TABLET | Freq: Every day | ORAL | Status: DC
  Administered 2018-12-01 – 2018-12-02 (×2): 75 mg via ORAL
  Filled 2018-11-30 (×2): qty 1

## 2018-11-30 MED ORDER — ONDANSETRON HCL 4 MG/2ML IJ SOLN
INTRAMUSCULAR | Status: AC
  Filled 2018-11-30: qty 4

## 2018-11-30 MED ORDER — DEXAMETHASONE SODIUM PHOSPHATE 10 MG/ML IJ SOLN
INTRAMUSCULAR | Status: DC | PRN
  Administered 2018-11-30: 10 mg via INTRAVENOUS

## 2018-11-30 MED ORDER — POTASSIUM CHLORIDE CRYS ER 20 MEQ PO TBCR: 20.0000 meq | EXTENDED_RELEASE_TABLET | Freq: Every day | ORAL | Status: DC | PRN

## 2018-11-30 MED ORDER — NITROGLYCERIN 0.4 MG SL SUBL: 0.4000 mg | SUBLINGUAL_TABLET | SUBLINGUAL | Status: DC | PRN

## 2018-11-30 MED ORDER — FENOFIBRATE 160 MG PO TABS
160.0000 mg | ORAL_TABLET | Freq: Every day | ORAL | Status: DC
  Administered 2018-12-01 – 2018-12-02 (×2): 160 mg via ORAL
  Filled 2018-11-30 (×2): qty 1

## 2018-11-30 MED ORDER — SODIUM CHLORIDE 0.9 % IV SOLN: 500.0000 mL | Freq: Once | INTRAVENOUS | Status: DC | PRN

## 2018-11-30 MED ORDER — CEFAZOLIN SODIUM-DEXTROSE 2-4 GM/100ML-% IV SOLN
2.0000 g | INTRAVENOUS | Status: AC
  Administered 2018-11-30: 2 g via INTRAVENOUS

## 2018-11-30 MED ORDER — LEVOTHYROXINE SODIUM 112 MCG PO TABS
112.0000 ug | ORAL_TABLET | Freq: Every day | ORAL | Status: DC
  Administered 2018-12-01 – 2018-12-02 (×2): 112 ug via ORAL
  Filled 2018-11-30 (×2): qty 1

## 2018-11-30 MED ORDER — MIDAZOLAM HCL 5 MG/5ML IJ SOLN
INTRAMUSCULAR | Status: DC | PRN
  Administered 2018-11-30: 2 mg via INTRAVENOUS

## 2018-11-30 MED ORDER — SUGAMMADEX SODIUM 200 MG/2ML IV SOLN
INTRAVENOUS | Status: DC | PRN
  Administered 2018-11-30: 200 mg via INTRAVENOUS

## 2018-11-30 MED ORDER — DEXAMETHASONE SODIUM PHOSPHATE 10 MG/ML IJ SOLN
INTRAMUSCULAR | Status: AC
  Filled 2018-11-30: qty 1

## 2018-11-30 MED ORDER — METOPROLOL TARTRATE 5 MG/5ML IV SOLN: 2.0000 mg | INTRAVENOUS | Status: DC | PRN

## 2018-11-30 MED ORDER — CEFAZOLIN SODIUM-DEXTROSE 2-4 GM/100ML-% IV SOLN
INTRAVENOUS | Status: AC
  Filled 2018-11-30: qty 100

## 2018-11-30 MED ORDER — PROTAMINE SULFATE 10 MG/ML IV SOLN
INTRAVENOUS | Status: AC
  Filled 2018-11-30: qty 10

## 2018-11-30 MED ORDER — DOCUSATE SODIUM 100 MG PO CAPS
100.0000 mg | ORAL_CAPSULE | Freq: Every day | ORAL | Status: DC
  Administered 2018-12-01 – 2018-12-02 (×2): 100 mg via ORAL
  Filled 2018-11-30 (×2): qty 1

## 2018-11-30 MED ORDER — CEFAZOLIN SODIUM-DEXTROSE 2-4 GM/100ML-% IV SOLN
2.0000 g | Freq: Three times a day (TID) | INTRAVENOUS | Status: AC
  Administered 2018-11-30 – 2018-12-01 (×2): 2 g via INTRAVENOUS
  Filled 2018-11-30 (×3): qty 100

## 2018-11-30 MED ORDER — GABAPENTIN 300 MG PO CAPS: 300.0000 mg | ORAL_CAPSULE | Freq: Every day | ORAL | Status: DC | PRN

## 2018-11-30 MED ORDER — VITAMIN D 25 MCG (1000 UNIT) PO TABS
4000.0000 [IU] | ORAL_TABLET | Freq: Every day | ORAL | Status: DC
  Administered 2018-12-01 – 2018-12-02 (×2): 4000 [IU] via ORAL
  Filled 2018-11-30: qty 10
  Filled 2018-11-30 (×2): qty 4

## 2018-11-30 MED ORDER — POLYETHYLENE GLYCOL 3350 17 G PO PACK: 17.0000 g | PACK | Freq: Every day | ORAL | Status: DC | PRN

## 2018-11-30 MED ORDER — OXYCODONE-ACETAMINOPHEN 5-325 MG PO TABS
1.0000 | ORAL_TABLET | ORAL | Status: DC | PRN
  Administered 2018-11-30: 1 via ORAL

## 2018-11-30 MED ORDER — SODIUM CHLORIDE 0.9 % IV SOLN
INTRAVENOUS | Status: DC
  Administered 2018-11-30: 16:00:00 via INTRAVENOUS

## 2018-11-30 MED ORDER — PROTAMINE SULFATE 10 MG/ML IV SOLN
INTRAVENOUS | Status: DC | PRN
  Administered 2018-11-30: 20 mg via INTRAVENOUS
  Administered 2018-11-30: 10 mg via INTRAVENOUS
  Administered 2018-11-30: 30 mg via INTRAVENOUS
  Administered 2018-11-30 (×2): 20 mg via INTRAVENOUS

## 2018-11-30 MED ORDER — OXYCODONE-ACETAMINOPHEN 5-325 MG PO TABS
ORAL_TABLET | ORAL | Status: AC
  Administered 2018-11-30: 14:00:00
  Filled 2018-11-30: qty 1

## 2018-11-30 SURGICAL SUPPLY — 55 items
ADH SKN CLS APL DERMABOND .7 (GAUZE/BANDAGES/DRESSINGS) ×3
BANDAGE ESMARK 6X9 LF (GAUZE/BANDAGES/DRESSINGS) IMPLANT
BNDG ESMARK 6X9 LF (GAUZE/BANDAGES/DRESSINGS)
CANISTER SUCT 3000ML PPV (MISCELLANEOUS) ×2 IMPLANT
CANNULA VESSEL 3MM 2 BLNT TIP (CANNULA) ×3 IMPLANT
CLIP FOGARTY SPRING 6M (CLIP) IMPLANT
CLIP LIGATING EXTRA MED SLVR (CLIP) ×2 IMPLANT
CLIP LIGATING EXTRA SM BLUE (MISCELLANEOUS) ×2 IMPLANT
COVER WAND RF STERILE (DRAPES) ×1 IMPLANT
CUFF TOURN SGL QUICK 34 (TOURNIQUET CUFF)
CUFF TOURNIQUET SINGLE 44IN (TOURNIQUET CUFF) IMPLANT
CUFF TRNQT CYL 34X4.125X (TOURNIQUET CUFF) IMPLANT
DERMABOND ADVANCED (GAUZE/BANDAGES/DRESSINGS) ×3
DERMABOND ADVANCED .7 DNX12 (GAUZE/BANDAGES/DRESSINGS) ×1 IMPLANT
DRAIN SNY 10X20 3/4 PERF (WOUND CARE) IMPLANT
DRAPE HALF SHEET 40X57 (DRAPES) IMPLANT
DRAPE X-RAY CASS 24X20 (DRAPES) IMPLANT
ELECT REM PT RETURN 9FT ADLT (ELECTROSURGICAL) ×2
ELECTRODE REM PT RTRN 9FT ADLT (ELECTROSURGICAL) ×1 IMPLANT
EVACUATOR SILICONE 100CC (DRAIN) IMPLANT
GAUZE SPONGE 4X4 12PLY STRL (GAUZE/BANDAGES/DRESSINGS) ×1 IMPLANT
GLOVE BIO SURGEON STRL SZ 6.5 (GLOVE) ×2 IMPLANT
GLOVE BIOGEL PI IND STRL 6.5 (GLOVE) IMPLANT
GLOVE BIOGEL PI INDICATOR 6.5 (GLOVE) ×5
GLOVE ECLIPSE 6.5 STRL STRAW (GLOVE) ×1 IMPLANT
GLOVE SS BIOGEL STRL SZ 7.5 (GLOVE) ×1 IMPLANT
GLOVE SUPERSENSE BIOGEL SZ 7.5 (GLOVE) ×1
GLOVE SURG SS PI 6.5 STRL IVOR (GLOVE) ×2 IMPLANT
GOWN STRL REUS W/ TWL LRG LVL3 (GOWN DISPOSABLE) ×3 IMPLANT
GOWN STRL REUS W/TWL LRG LVL3 (GOWN DISPOSABLE) ×10
INSERT FOGARTY SM (MISCELLANEOUS) IMPLANT
KIT BASIN OR (CUSTOM PROCEDURE TRAY) ×2 IMPLANT
KIT TURNOVER KIT B (KITS) ×2 IMPLANT
NS IRRIG 1000ML POUR BTL (IV SOLUTION) ×4 IMPLANT
PACK PERIPHERAL VASCULAR (CUSTOM PROCEDURE TRAY) ×2 IMPLANT
PAD ARMBOARD 7.5X6 YLW CONV (MISCELLANEOUS) ×4 IMPLANT
PADDING CAST COTTON 6X4 STRL (CAST SUPPLIES) IMPLANT
SET COLLECT BLD 21X3/4 12 (NEEDLE) IMPLANT
STAPLER VISISTAT 35W (STAPLE) IMPLANT
STOPCOCK 4 WAY LG BORE MALE ST (IV SETS) IMPLANT
SUT ETHIBOND 5 LR DA (SUTURE) ×2 IMPLANT
SUT ETHILON 3 0 PS 1 (SUTURE) IMPLANT
SUT PROLENE 5 0 C 1 24 (SUTURE) ×2 IMPLANT
SUT PROLENE 6 0 CC (SUTURE) ×4 IMPLANT
SUT SILK 2 0 SH (SUTURE) ×1 IMPLANT
SUT SILK 3 0 (SUTURE) ×2
SUT SILK 3-0 18XBRD TIE 12 (SUTURE) IMPLANT
SUT VIC AB 2-0 CTX 36 (SUTURE) ×4 IMPLANT
SUT VIC AB 3-0 SH 27 (SUTURE) ×4
SUT VIC AB 3-0 SH 27X BRD (SUTURE) ×2 IMPLANT
TOWEL GREEN STERILE (TOWEL DISPOSABLE) ×2 IMPLANT
TRAY FOLEY MTR SLVR 16FR STAT (SET/KITS/TRAYS/PACK) ×2 IMPLANT
TUBING EXTENTION W/L.L. (IV SETS) IMPLANT
UNDERPAD 30X30 (UNDERPADS AND DIAPERS) ×2 IMPLANT
WATER STERILE IRR 1000ML POUR (IV SOLUTION) ×2 IMPLANT

## 2018-11-30 NOTE — Evaluation (Signed)
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  pt now w/p repair of right popliteal artery.  Clinical Impression  Pt admitted with/for right popliteal aneurysm repair.  Presently, pt needs mod assist for mobility, but should quickly improve.  Pt currently limited functionally due to the problems listed below.  (see problems list.)  Pt will benefit from PT to maximize function and safety to be able to get home safely with available assist.     Follow Up Recommendations No PT follow up;Supervision/Assistance - 24 hour    Equipment Recommendations  Rolling walker with 5" wheels;3in1 (PT)    Recommendations for Other Services       Precautions / Restrictions Precautions Precautions: Fall      Mobility  Bed Mobility Overal bed mobility: Needs Assistance Bed Mobility: Supine to Sit;Sit to Supine     Supine to sit: Mod assist Sit to supine: Mod assist   General bed mobility comments: light mod assist for trucal assist or R LE in/out of bed.  Transfers Overall transfer level: Needs assistance   Transfers: Sit to/from Stand Sit to Stand: Mod assist         General transfer comment: cues for hand placement and boost assist  Ambulation/Gait Ambulation/Gait assistance: Min assist;Min guard Gait Distance (Feet): 2 Feet(forward and back) Assistive device: Rolling walker (2 wheeled) Gait Pattern/deviations: Step-to pattern     General Gait Details: heavy use of the RW.  Pain limiting distance  Stairs            Wheelchair Mobility    Modified Rankin (Stroke Patients Only)       Balance Overall balance assessment: No apparent balance deficits (not formally assessed);Needs  assistance   Sitting balance-Leahy Scale: Fair     Standing balance support: Bilateral upper extremity supported Standing balance-Leahy Scale: Poor Standing balance comment: heavy use of the RW                             Pertinent Vitals/Pain Pain Assessment: Faces Faces Pain Scale: Hurts whole lot Pain Location: right leg  Pain Descriptors / Indicators: Discomfort;Grimacing;Guarding;Operative site guarding Pain Intervention(s): Monitored during session;Limited activity within patient's tolerance    Home Living Family/patient expects to be discharged to:: Private residence Living Arrangements: Spouse/significant other Available Help at Discharge: Family;Available PRN/intermittently;Available 24 hours/day Type of Home: House Home Access: Stairs to enter Entrance Stairs-Rails: Psychiatric nurse of Steps: several Home Layout: One level Home Equipment: None      Prior Function Level of Independence: Independent               Hand Dominance        Extremity/Trunk Assessment   Upper Extremity Assessment Upper Extremity Assessment: Overall WFL for tasks assessed    Lower Extremity Assessment Lower Extremity Assessment: Overall WFL for tasks assessed;RLE deficits/detail RLE: Unable to fully assess due to pain RLE Coordination: decreased fine motor;decreased gross motor       Communication   Communication: No difficulties  Cognition Arousal/Alertness: Awake/alert Behavior During Therapy: WFL for tasks assessed/performed Overall Cognitive Status: Within Functional Limits for tasks assessed  General Comments      Exercises     Assessment/Plan    PT Assessment Patient needs continued PT services  PT Problem List Decreased activity tolerance;Decreased mobility;Decreased knowledge of use of DME;Pain       PT Treatment Interventions Gait training;Functional mobility  training;Therapeutic activities;Patient/family education;Stair training;DME instruction    PT Goals (Current goals can be found in the Care Plan section)  Acute Rehab PT Goals Patient Stated Goal: home tomorrow PT Goal Formulation: With patient Time For Goal Achievement: 12/03/18 Potential to Achieve Goals: Good    Frequency Min 3X/week   Barriers to discharge        Co-evaluation               AM-PAC PT "6 Clicks" Mobility  Outcome Measure Help needed turning from your back to your side while in a flat bed without using bedrails?: A Lot Help needed moving from lying on your back to sitting on the side of a flat bed without using bedrails?: A Lot Help needed moving to and from a bed to a chair (including a wheelchair)?: A Lot Help needed standing up from a chair using your arms (e.g., wheelchair or bedside chair)?: A Lot Help needed to walk in hospital room?: A Little Help needed climbing 3-5 steps with a railing? : A Lot 6 Click Score: 13    End of Session   Activity Tolerance: Patient limited by pain Patient left: in chair;with call bell/phone within reach Nurse Communication: Mobility status PT Visit Diagnosis: Other abnormalities of gait and mobility (R26.89);Pain Pain - Right/Left: Right Pain - part of body: Leg    Time: 4782-9562 PT Time Calculation (min) (ACUTE ONLY): 32 min   Charges:   PT Evaluation $PT Eval Moderate Complexity: 1 Mod PT Treatments $Therapeutic Activity: 8-22 mins        11/30/2018  Donnella Sham, PT Acute Rehabilitation Services [phone]  (pager) [phone]  (office)  Tessie Fass Yaritzi Craun 11/30/2018, 4:32 PM

## 2018-11-30 NOTE — Anesthesia Procedure Notes (Signed)
Procedure Name: Intubation Date/Time: 11/30/2018 10:04 AM Performed by: Candis Shine, CRNA Pre-anesthesia Checklist: Patient identified, Emergency Drugs available, Suction available and Patient being monitored Patient Re-evaluated:Patient Re-evaluated prior to induction Oxygen Delivery Method: Circle System Utilized Preoxygenation: Pre-oxygenation with 100% oxygen Induction Type: IV induction Ventilation: Mask ventilation without difficulty and Oral airway inserted - appropriate to patient size Laryngoscope Size: Mac and 4 Grade View: Grade I Tube type: Oral Tube size: 7.5 mm Number of attempts: 1 Airway Equipment and Method: Stylet and Oral airway Placement Confirmation: ETT inserted through vocal cords under direct vision,  positive ETCO2 and breath sounds checked- equal and bilateral Secured at: 23 cm Tube secured with: Tape Dental Injury: Teeth and Oropharynx as per pre-operative assessment

## 2018-11-30 NOTE — Progress Notes (Addendum)
Currently oriented to self only/seen by Sam Rhyne/PA and aware

## 2018-11-30 NOTE — Progress Notes (Signed)
Alert/oriented x 4 and apologetic for being aggressive earlier/ states he

## 2018-11-30 NOTE — Transfer of Care (Signed)
Immediate Anesthesia Transfer of Care Note  Patient: [name]  Procedure(s) Performed: RIGHT POPLITEAL ARTERY ANEURYSM REPAIR WITH ABOVE KNEE POPLITEAL TO BELOW KNEE POPLITEAL BY PASS GRAFT USING SAPPEHNOUS VEIN (Right )  Patient Location: PACU  Anesthesia Type:General  Level of Consciousness: drowsy and responds to stimulation  Airway & Oxygen Therapy: Patient Spontanous Breathing and Patient connected to nasal cannula oxygen  Post-op Assessment: Report given to RN and Post -op Vital signs reviewed and stable  Post vital signs: Reviewed and stable  Last Vitals:  Vitals Value Taken Time  BP 128/70 11/30/18 1311  Temp    Pulse 62 11/30/18 1312  Resp 11 11/30/18 1312  SpO2 96 % 11/30/18 1312  Vitals shown include unvalidated device data.  Last Pain:  Vitals:   11/30/18 0809  TempSrc:   PainSc: 0-No pain      Patients Stated Pain Goal: 2 (16/60/60 0459)  Complications: No apparent anesthesia complications

## 2018-11-30 NOTE — Progress Notes (Signed)
  Day of Surgery Note    Subjective:  No complaints of pain.  Somewhat confused and only orients to self   Vitals:   11/30/18 1327 11/30/18 1330  BP: 139/79 139/79  Pulse: 63 62  Resp: 11 17  Temp:    SpO2:  93%    Incisions:   Above and below knee incisions are clean and dry Extremities:  Easily palpable right DP pulse Cardiac:  regular Lungs:  Non labored    Assessment/Plan:  This is a 73 y.o. male who is s/p  Popliteal to popliteal bypass grafting with GSV for popliteal aneurysm  -pt with patent bypass with easily palpable right DP pulse -post operative confusion-alert to self.  Will continue to monitor -to [place] later this afternoon.   Leontine Locket, PA-C 11/30/2018 1:48 PM [phone]

## 2018-11-30 NOTE — Anesthesia Postprocedure Evaluation (Signed)
Anesthesia Post Note  Patient: Dennis [name]  Procedure(s) Performed: RIGHT POPLITEAL ARTERY ANEURYSM REPAIR WITH ABOVE KNEE POPLITEAL TO BELOW KNEE POPLITEAL BY PASS GRAFT USING SAPPEHNOUS VEIN (Right )     Patient location during evaluation: PACU Anesthesia Type: General Level of consciousness: awake and alert, patient cooperative and oriented Pain management: pain level controlled Vital Signs Assessment: post-procedure vital signs reviewed and stable Respiratory status: spontaneous breathing, nonlabored ventilation, respiratory function stable and patient connected to nasal cannula oxygen Cardiovascular status: blood pressure returned to baseline and stable Postop Assessment: no apparent nausea or vomiting Anesthetic complications: no    Last Vitals:  Vitals:   11/30/18 1355 11/30/18 1400  BP:  (!) 153/89  Pulse: 68 67  Resp: 15 10  Temp:    SpO2: 96% 96%    Last Pain:  Vitals:   11/30/18 1500  TempSrc:   PainSc: Asleep                 Niralya Ohanian,E. Rini Moffit

## 2018-11-30 NOTE — Interval H&P Note (Signed)
History and Physical Interval Note:  11/30/2018 9:14 AM  Dennis [name]  has presented today for surgery, with the diagnosis of POPLITEAL ARTERY ANEURYSM RIGHT LEG.  The various methods of treatment have been discussed with the patient and family. After consideration of risks, benefits and other options for treatment, the patient has consented to  Procedure(s): RIGHT POPLITEAL ARTERY ANEURYSM REPAIR (Right) as a surgical intervention.  The patient's history has been reviewed, patient examined, no change in status, stable for surgery.  I have reviewed the patient's chart and labs.  Questions were answered to the patient's satisfaction.     Curt Jews

## 2018-12-01 ENCOUNTER — Encounter (HOSPITAL_COMMUNITY): Payer: Self-pay | Admitting: Vascular Surgery

## 2018-12-01 LAB — BASIC METABOLIC PANEL
Anion gap: 9 (ref 5–15)
BUN: 15 mg/dL (ref 8–23)
CO2: 25 mmol/L (ref 22–32)
Calcium: 8.8 mg/dL — ABNORMAL LOW (ref 8.9–10.3)
Chloride: 106 mmol/L (ref 98–111)
Creatinine, Ser: 1.19 mg/dL (ref 0.61–1.24)
GFR calc Af Amer: >60 mL/min (ref >60)
GFR calc non Af Amer: >60 mL/min (ref >60)
Glucose, Bld: 125 mg/dL — ABNORMAL HIGH (ref 70–99)
Potassium: 4 mmol/L (ref 3.5–5.1)
Sodium: 140 mmol/L (ref 135–145)

## 2018-12-01 LAB — CBC
HCT: 38.7 % — ABNORMAL LOW (ref 39.0–52.0)
Hemoglobin: 12.6 g/dL — ABNORMAL LOW (ref 13.0–17.0)
MCH: 29.7 pg (ref 26.0–34.0)
MCHC: 32.6 g/dL (ref 30.0–36.0)
MCV: 91.3 fL (ref 80.0–100.0)
Platelets: 204 10*3/uL (ref 150–400)
RBC: 4.24 MIL/uL (ref 4.22–5.81)
RDW: 13.3 % (ref 11.5–15.5)
WBC: 9.5 10*3/uL (ref 4.0–10.5)
nRBC: 0 % (ref 0.0–0.2)

## 2018-12-01 MED ORDER — OXYCODONE-ACETAMINOPHEN 5-325 MG PO TABS: 1.0000 | ORAL_TABLET | Freq: Four times a day (QID) | ORAL | 0 refills | Status: DC | PRN

## 2018-12-01 MED ORDER — OXYCODONE-ACETAMINOPHEN 5-325 MG PO TABS
1.0000 | ORAL_TABLET | ORAL | Status: DC | PRN
  Administered 2018-12-01 – 2018-12-02 (×4): 2 via ORAL
  Filled 2018-12-01 (×4): qty 2

## 2018-12-01 NOTE — Evaluation (Signed)
Occupational Therapy Evaluation Patient Details Name: Dennis [name] MRN: [id number] DOB: [birth year] Today's Date: 12/01/2018    History of Present Illness pt is a 73 y/o male with h/o MI, HTN, CAD withRCA stent admitted sith dx  of large right popliteal artery aneurysm when he was undergoing a duplex for discomfort in his right leg.  pt now w/p repair of right popliteal artery.   Clinical Impression   Pt admitted with above. He demonstrates the below listed deficits and will benefit from continued OT to maximize safety and independence with BADLs.  Pt presents with significant pain Rt LE/knee.  He requires mod - max A for LB ADLs, and min A for functional transfers.  He attempted to ambulate to BR, but was unable to make it due to increased pain, and he was unable to tolerate WBing on Rt LE.   He lives with wife, who is supportive and able to assist at discharge.  Spoke with MD re: current status.  Will follow acutely.       Follow Up Recommendations  No OT follow up;Supervision/Assistance - 24 hour(initially )    Equipment Recommendations  3 in 1 bedside commode    Recommendations for Other Services       Precautions / Restrictions Precautions Precautions: Fall      Mobility Bed Mobility Overal bed mobility: Needs Assistance Bed Mobility: Supine to Sit     Supine to sit: Min guard     General bed mobility comments: increased time and effort due to pain   Transfers Overall transfer level: Needs assistance   Transfers: Sit to/from Stand;Stand Pivot Transfers Sit to Stand: Min assist Stand pivot transfers: Min assist       General transfer comment: assist to steady.  Pt unable to tolerate WBing through Rt LE     Balance Overall balance assessment: Needs assistance Sitting-balance support: Feet supported Sitting balance-Leahy Scale: Good     Standing balance support: Bilateral upper extremity supported Standing balance-Leahy Scale: Poor Standing balance  comment: heavily reliant on Use of RW                            ADL either performed or assessed with clinical judgement   ADL Overall ADL's : Needs assistance/impaired Eating/Feeding: Independent   Grooming: Wash/dry hands;Wash/dry face;Applying deodorant;Brushing hair;Oral care;Set up;Sitting Grooming Details (indicate cue type and reason): unable to tolerate in standing  Upper Body Bathing: Set up;Supervision/ safety;Sitting   Lower Body Bathing: Moderate assistance;Sit to/from stand   Upper Body Dressing : Set up;Sitting   Lower Body Dressing: Maximal assistance;Sit to/from stand Lower Body Dressing Details (indicate cue type and reason): unable to access Rt foot due to pain, and unable to pull pants over hips  Toilet Transfer: Minimal assistance;Stand-pivot;BSC;RW Toilet Transfer Details (indicate cue type and reason): attempted to ambulate to BR, but unable due to pain  Toileting- Clothing Manipulation and Hygiene: Moderate assistance;Sit to/from stand       Functional mobility during ADLs: Minimal assistance;Rolling walker General ADL Comments: Pt unable to tolerate WBing through Rt foot      Vision         Perception     Praxis      Pertinent Vitals/Pain Pain Assessment: 0-10 Faces Pain Scale: Hurts whole lot Pain Location: right leg  Pain Descriptors / Indicators: Discomfort;Grimacing;Guarding;Operative site guarding Pain Intervention(s): Monitored during session;Repositioned;Limited activity within patient's tolerance;Patient requesting pain meds-RN notified     Hand  Dominance     Extremity/Trunk Assessment Upper Extremity Assessment Upper Extremity Assessment: Overall WFL for tasks assessed   Lower Extremity Assessment Lower Extremity Assessment: Defer to PT evaluation   Cervical / Trunk Assessment Cervical / Trunk Assessment: Normal   Communication Communication Communication: No difficulties   Cognition Arousal/Alertness:  Awake/alert Behavior During Therapy: Anxious Overall Cognitive Status: Within Functional Limits for tasks assessed                                     General Comments  Discussed with Dr Early     Exercises     Shoulder Instructions      Home Living Family/patient expects to be discharged to:: Private residence Living Arrangements: Spouse/significant other Available Help at Discharge: Family;Available PRN/intermittently;Available 24 hours/day Type of Home: House Home Access: Stairs to enter [employer] of Steps: several Entrance Stairs-Rails: Right;Left Home Layout: One level     Bathroom Shower/Tub: Occupational psychologist: Handicapped height     Home Equipment: Grab bars - tub/shower;Shower seat - built in   Additional Comments: Pt reports home is w/c accessible       Prior Functioning/Environment Level of Independence: Independent                 OT Problem List: Decreased activity tolerance;Impaired balance (sitting and/or standing);Decreased safety awareness;Pain;Decreased knowledge of use of DME or AE      OT Treatment/Interventions: Self-care/ADL training;DME and/or AE instruction;Therapeutic activities;Patient/family education;Balance training    OT Goals(Current goals can be found in the care plan section) Acute Rehab OT Goals Patient Stated Goal: to have less pain  OT Goal Formulation: With patient Time For Goal Achievement: 12/15/18 Potential to Achieve Goals: Good ADL Goals Pt Will Perform Grooming: with supervision;standing Pt Will Perform Lower Body Bathing: with supervision;sit to/from stand Pt Will Perform Lower Body Dressing: with supervision;sit to/from stand Pt Will Transfer to Toilet: with supervision;ambulating;regular height toilet;grab bars Pt Will Perform Toileting - Clothing Manipulation and hygiene: with supervision;sit to/from stand Pt Will Perform Tub/Shower Transfer: Shower transfer;with  supervision;ambulating;shower seat;grab bars;rolling walker  OT Frequency: Min 2X/week   Barriers to D/C:            Co-evaluation              AM-PAC OT "6 Clicks" Daily Activity     Outcome Measure Help from another person eating meals?: None Help from another person taking care of personal grooming?: A Lot Help from another person toileting, which includes using toliet, bedpan, or urinal?: A Lot Help from another person bathing (including washing, rinsing, drying)?: A Lot Help from another person to put on and taking off regular upper body clothing?: None Help from another person to put on and taking off regular lower body clothing?: A Lot 6 Click Score: 16   End of Session Equipment Utilized During Treatment: Gait belt;Rolling walker Nurse Communication: Mobility status;Patient requests pain meds  Activity Tolerance: Patient limited by pain Patient left: in bed;with call bell/phone within reach;with bed alarm set  OT Visit Diagnosis: Unsteadiness on feet (R26.81);Pain Pain - Right/Left: Right Pain - part of body: Knee                Time: 6712-4580 OT Time Calculation (min): 19 min Charges:  OT General Charges $OT Visit: 1 Visit OT Evaluation $OT Eval Moderate Complexity: 1 Mod  Marguerite Barba, OTR/L Acute Rehabilitation Services  Pager [phone] Office [phone]   Lucille Passy M 12/01/2018, 10:41 AM

## 2018-12-01 NOTE — Discharge Summary (Addendum)
Discharge Summary     Dennis [name] 25-Jul-1945 73 y.o. male  [id number]  Admission Date: 11/30/2018  Discharge Date: 12/01/2018  Physician: Rosetta Posner, MD  Admission Diagnosis: POPLITEAL ARTERY ANEURYSM RIGHT LEG   HPI:   This is a 73 y.o. 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  He subsequently underwent CT angiogram of his abdomen pelvis and runoff to rule out any other aneurysms.  He does report some numbness and tingling from his knee distally which may be nerve impingement.  He also reports some pain higher in his hip and thigh and explained this would not be attributable to his aneurysm  Hospital Course:  The patient was admitted to the hospital and taken to the operating room on 11/30/2018 and underwent: Right popliteal to popliteal bypass grafting with SVG.    The pt tolerated the procedure well and was transported to the PACU in good condition.   By POD 1, pt had ambulated, voided after foley removal.   By POD 2, he was feeling better.  He continues to have a palpable right DP pulse.  Discussed post operative swelling with pt.  Continue to mobilize and ambulate at home.   The remainder of the hospital course consisted of increasing mobilization and increasing intake of solids without difficulty.  CBC    Component Value Date/Time   WBC 9.5 12/01/2018 0414   RBC 4.24 12/01/2018 0414   HGB 12.6 (L) 12/01/2018 0414   HGB 14.2 11/10/2016 1050   HCT 38.7 (L) 12/01/2018 0414   HCT 41.6 11/10/2016 1050   PLT 204 12/01/2018 0414   PLT 222 11/10/2016 1050   MCV 91.3 12/01/2018 0414   MCV 88 11/10/2016 1050   MCH 29.7 12/01/2018 0414   MCHC 32.6 12/01/2018 0414   RDW 13.3 12/01/2018 0414   RDW 13.7 11/10/2016 1050   LYMPHSABS 1.3 11/10/2016 1050   MONOABS 0.6 11/07/2016 2003   EOSABS 0.1 11/10/2016 1050   BASOSABS 0.0 11/10/2016 1050    BMET    Component Value  Date/Time   NA 140 12/01/2018 0414   NA 139 11/10/2016 1050   K 4.0 12/01/2018 0414   CL 106 12/01/2018 0414   CO2 25 12/01/2018 0414   GLUCOSE 125 (H) 12/01/2018 0414   BUN 15 12/01/2018 0414   BUN 16 11/10/2016 1050   CREATININE 1.19 12/01/2018 0414   CALCIUM 8.8 (L) 12/01/2018 0414   GFRNONAA >60 12/01/2018 0414   GFRAA >60 12/01/2018 0414       Discharge Diagnosis:  POPLITEAL ARTERY ANEURYSM RIGHT LEG  Secondary Diagnosis: Patient Active Problem List   Diagnosis Date Noted  . 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 Popliteal artery aneurysm ([place]) 11/12/2018  . Essential hypertension 10/03/2014  . RBBB 10/03/2014  . Unstable angina ([place]) 09/30/2014  . Hypothyroidism   . Dyslipidemia   . CAD S/P multiple PCIs   . Thyroid nodule, hot    Past Medical History:  Diagnosis Date  . Anginal pain ([place])    denied chest pain 11/28/18  . CAD (coronary artery disease)    a. 2002 Stent RCA & circumflex EF40-50%   . GERD (gastroesophageal reflux disease)   . Hyperlipidemia   . Hypertension   . Hypothyroidism   . Myocardial infarction ([place]) 03/2001  . Squamous cell carcinoma of scalp    "some burned, some cut off" (11/17/2016)  . Thyroid nodule, hot  s/p radioactive iodine, now hypothyroidism     Allergies as of 12/01/2018   No Known Allergies     Medication List    TAKE these medications   amLODipine 5 MG tablet Commonly known as: NORVASC Take 1 tablet (5 mg total) by mouth daily.   ascorbic acid 1000 MG tablet Commonly known as: VITAMIN C Take 1,000 mg by mouth daily.   aspirin 81 MG tablet Take 81 mg by mouth every evening.   clopidogrel 75 MG tablet Commonly known as: PLAVIX Take 1 tablet by mouth once daily   fenofibrate 160 MG tablet Take 160 mg by mouth daily.   furosemide 20 MG tablet Commonly known as: LASIX Take 20 mg by mouth daily as needed for fluid.   gabapentin 300 MG capsule Commonly known as: NEURONTIN Take 1 capsule (300 mg total)  by mouth daily. What changed:   when to take this  reasons to take this   levothyroxine 112 MCG tablet Commonly known as: SYNTHROID Take 112 mcg by mouth daily.   magnesium oxide 400 MG tablet Commonly known as: MAG-OX Take 400 mg by mouth daily.   metoprolol succinate 100 MG 24 hr tablet Commonly known as: TOPROL-XL Take 150 mg by mouth daily.   nitroGLYCERIN 0.4 MG SL tablet Commonly known as: NITROSTAT Place 1 tablet (0.4 mg total) under the tongue every 5 (five) minutes as needed for chest pain.   omega-3 acid ethyl esters 1 g capsule Commonly known as: LOVAZA Take 1 g by mouth daily.   oxyCODONE-acetaminophen 5-325 MG tablet Commonly known as: Percocet Take 1 tablet by mouth every 6 (six) hours as needed for severe pain.   ramipril 10 MG capsule Commonly known as: ALTACE Take 10 mg by mouth every evening.   Vitamin D 50 MCG (2000 UT) tablet Take 4,000 Units by mouth daily.   Zegerid OTC 20-1100 MG Caps capsule Generic drug: Omeprazole-Sodium Bicarbonate Take 1 capsule by mouth daily as needed (acid reflux).       Discharge Instructions: [facility] Discharge instructions Lower Extremity Bypass Surgery  Please refer to the following instruction for your post-procedure care. Your surgeon or physician assistant will discuss any changes with you.  Activity  You are encouraged to walk as much as you can. You can slowly return to normal activities during the month after your surgery. Avoid strenuous activity and heavy lifting until your doctor tells you it's OK. Avoid activities such as vacuuming or swinging a golf club. Do not drive until your doctor give the OK and you are no longer taking prescription pain medications. It is also normal to have difficulty with sleep habits, eating and bowel movement after surgery. These will go away with time.  Bathing/Showering  You may shower after you go home. Do not soak in a bathtub, hot  tub, or swim until the incision heals completely.  Incision Care  Clean your incision with mild soap and water. Shower every day. Pat the area dry with a clean towel. You do not need a bandage unless otherwise instructed. Do not apply any ointments or creams to your incision. If you have open wounds you will be instructed how to care for them or a visiting nurse may be arranged for you. If you have staples or sutures along your incision they will be removed at your post-op appointment. You may have skin glue on your incision. Do not peel it off. It will come off on its own in  about one week.  Wash the groin wound with soap and water daily and pat dry. (No tub bath-only shower)  Then put a dry gauze or washcloth in the groin to keep this area dry to help prevent wound infection.  Do this daily and as needed.  Do not use Vaseline or neosporin on your incisions.  Only use soap and water on your incisions and then protect and keep dry.  Diet  Resume your normal diet. There are no special food restrictions following this procedure. A low fat/ low cholesterol diet is recommended for all patients with vascular disease. In order to heal from your surgery, it is CRITICAL to get adequate nutrition. Your body requires vitamins, minerals, and protein. Vegetables are the best source of vitamins and minerals. Vegetables also provide the perfect balance of protein. Processed food has little nutritional value, so try to avoid this.  Medications  Resume taking all your medications unless your doctor or Physician Assistant tells you not to. If your incision is causing pain, you may take over-the-counter pain relievers such as acetaminophen (Tylenol). If you were prescribed a stronger pain medication, please aware these medication can cause nausea and constipation. Prevent nausea by taking the medication with a snack or meal. Avoid constipation by drinking plenty of fluids and eating foods with high amount of fiber, such  as fruits, vegetables, and grains. Take Colace 100 mg (an over-the-counter stool softener) twice a day as needed for constipation.  Do not take Tylenol if you are taking prescription pain medications.  Follow Up  Our office will schedule a follow up appointment 2-3 weeks following discharge.  Please call us immediately for any of the following conditions  .Severe or worsening pain in your legs or feet while at rest or while walking .Increase pain, redness, warmth, or drainage (pus) from your incision site(s) . Fever of 101 degree or higher . The swelling in your leg with the bypass suddenly worsens and becomes more painful than when you were in the hospital . If you have been instructed to feel your graft pulse then you should do so every day. If you can no longer feel this pulse, call the office immediately. Not all patients are given this instruction. .  Leg swelling is common after leg bypass surgery.  The swelling should improve over a few months following surgery. To improve the swelling, you may elevate your legs above the level of your heart while you are sitting or resting. Your surgeon or physician assistant may ask you to apply an ACE wrap or wear compression (TED) stockings to help to reduce swelling.  Reduce your risk of vascular disease  Stop smoking. If you would like help call [facility] at 1-800-QUIT-NOW 913-467-0460) or [place] at [phone].  . Manage your cholesterol . Maintain a desired weight . Control your diabetes weight . Control your diabetes . Keep your blood pressure down .  If you have any questions, please call the office at [phone]   Prescriptions given: 1.  Roxicet #20 No Refill  Disposition: home  Patient's condition: is Good  Follow up: 1. Dr. Donnetta Hutching in 2 weeks   Leontine Locket, PA-C [facility] [phone] 12/01/2018  7:26 AM  - For VQI Registry use ---   Post-op:  Wound infection: No  Graft  infection: No  Transfusion: No    If yes, n/a units given New Arrhythmia: No Ipsilateral amputation: No, [ ]  Minor, [ ]  BKA, [ ]  AKA Discharge patency: [x ]  Primary, [ ]  Primary assisted, [ ]  Secondary, [ ]  Occluded Patency judged by: [ ]  Dopper only, [ ]  Palpable graft pulse, [x]  Palpable distal pulse, [ ]  ABI inc. > 0.15, [ ]  Duplex Discharge ABI: R not done, L  D/C Ambulatory Status: Ambulatory  Complications: MI: No, [ ]  Troponin only, [ ]  EKG or Clinical CHF: No Resp failure:No, [ ]  Pneumonia, [ ]  Ventilator Chg in renal function: No, [ ]  Inc. Cr > 0.5, [ ]  Temp. Dialysis,  [ ]  Permanent dialysis Stroke: No, [ ]  Minor, [ ]  Major Return to OR: No  Reason for return to OR: [ ]  Bleeding, [ ]  Infection, [ ]  Thrombosis, [ ]  Revision  Discharge medications: Statin use:  yes ASA use:  yes Plavix use:  yes Beta blocker use: yes CCB use:  Yes ACEI use:   yes ARB use:  no Coumadin use: no

## 2018-12-01 NOTE — Discharge Instructions (Signed)
 [facility] [place]  Discharge instructions  Lower Extremity Bypass Surgery  Please refer to the following instruction for your post-procedure care. Your surgeon or physician assistant will discuss any changes with you.  Activity  You are encouraged to walk as much as you can. You can slowly return to normal activities during the month after your surgery. Avoid strenuous activity and heavy lifting until your doctor tells you it's OK. Avoid activities such as vacuuming or swinging a golf club. Do not drive until your doctor give the OK and you are no longer taking prescription pain medications. It is also normal to have difficulty with sleep habits, eating and bowel movement after surgery. These will go away with time.  Bathing/Showering  Shower daily after you go home. Do not soak in a bathtub, hot tub, or swim until the incision heals completely.  Incision Care  Clean your incision with mild soap and water. Shower every day. Pat the area dry with a clean towel. You do not need a bandage unless otherwise instructed. Do not apply any ointments or creams to your incision. If you have open wounds you will be instructed how to care for them or a visiting nurse may be arranged for you. If you have staples or sutures along your incision they will be removed at your post-op appointment. You may have skin glue on your incision. Do not peel it off. It will come off on its own in about one week.   Diet  Resume your normal diet. There are no special food restrictions following this procedure. A low fat/ low cholesterol diet is recommended for all patients with vascular disease. In order to heal from your surgery, it is CRITICAL to get adequate nutrition. Your body requires vitamins, minerals, and protein. Vegetables are the best source of vitamins and minerals. Vegetables also provide the perfect balance of protein. Processed food has little nutritional value, so try to avoid  this.  Medications  Resume taking all your medications unless your doctor or physician assistant tells you not to. If your incision is causing pain, you may take over-the-counter pain relievers such as acetaminophen (Tylenol). If you were prescribed a stronger pain medication, please aware these medication can cause nausea and constipation. Prevent nausea by taking the medication with a snack or meal. Avoid constipation by drinking plenty of fluids and eating foods with high amount of fiber, such as fruits, vegetables, and grains. Take Colace 100 mg (an over-the-counter stool softener) twice a day as needed for constipation.  Do not take Tylenol if you are taking prescription pain medications.  Follow Up  Our office will schedule a follow up appointment 2-3 weeks following discharge.  Please call us immediately for any of the following conditions  .Severe or worsening pain in your legs or feet while at rest or while walking .Increase pain, redness, warmth, or drainage (pus) from your incision site(s) Fever of 101 degree or higher The swelling in your leg with the bypass suddenly worsens and becomes more painful than when you were in the hospital If you have been instructed to feel your graft pulse then you should do so every day. If you can no longer feel this pulse, call the office immediately. Not all patients are given this instruction.  Leg swelling is common after leg bypass surgery.  The swelling should improve over a few months following surgery. To improve the swelling, you may elevate your legs above the level of your heart while   you are sitting or resting. Your surgeon or physician assistant may ask you to apply an ACE wrap or wear compression (TED) stockings to help to reduce swelling.  Reduce your risk of vascular disease  Stop smoking. If you would like help call [facility] at 1-800-QUIT-NOW (1-800-784-8669) or [place] at [phone].  Manage your cholesterol Maintain a  desired weight Control your diabetes weight Control your diabetes Keep your blood pressure down  If you have any questions, please call the office at [phone]   

## 2018-12-01 NOTE — Care Management (Signed)
CM spoke with the patient at the bedside. Patient has an order for a RW. He states his wife is checking on the RW. He thinks they have one. Patient will let his nurse know if he needs a RW to be delivered prior to discharge.

## 2018-12-01 NOTE — Progress Notes (Addendum)
  Progress Note    12/01/2018 7:18 AM 1 Day Post-Op  Subjective:  C/o some soreness at the knee joint  Afebrile HR 50's-60's NSR 161'W-960'A systolic 54% RA  Vitals:   11/30/18 2350 12/01/18 0505  BP: 106/67 134/80  Pulse: 63 61  Resp: 17 20  Temp: 97.7 F (36.5 C) 97.7 F (36.5 C)  SpO2: 94% 96%    Physical Exam: Cardiac:  regular Lungs:  Non labored Incisions:  AK and BK incisions are clean and dry  Extremities:  Easily palpable right DP pulse   CBC    Component Value Date/Time   WBC 9.5 12/01/2018 0414   RBC 4.24 12/01/2018 0414   HGB 12.6 (L) 12/01/2018 0414   HGB 14.2 11/10/2016 1050   HCT 38.7 (L) 12/01/2018 0414   HCT 41.6 11/10/2016 1050   PLT 204 12/01/2018 0414   PLT 222 11/10/2016 1050   MCV 91.3 12/01/2018 0414   MCV 88 11/10/2016 1050   MCH 29.7 12/01/2018 0414   MCHC 32.6 12/01/2018 0414   RDW 13.3 12/01/2018 0414   RDW 13.7 11/10/2016 1050   LYMPHSABS 1.3 11/10/2016 1050   MONOABS 0.6 11/07/2016 2003   EOSABS 0.1 11/10/2016 1050   BASOSABS 0.0 11/10/2016 1050    BMET    Component Value Date/Time   NA 140 12/01/2018 0414   NA 139 11/10/2016 1050   K 4.0 12/01/2018 0414   CL 106 12/01/2018 0414   CO2 25 12/01/2018 0414   GLUCOSE 125 (H) 12/01/2018 0414   BUN 15 12/01/2018 0414   BUN 16 11/10/2016 1050   CREATININE 1.19 12/01/2018 0414   CALCIUM 8.8 (L) 12/01/2018 0414   GFRNONAA >60 12/01/2018 0414   GFRAA >60 12/01/2018 0414    INR    Component Value Date/Time   INR 1.1 11/28/2018 1600     Intake/Output Summary (Last 24 hours) at 12/01/2018 0718 Last data filed at 12/01/2018 0640 Gross per 24 hour  Intake 2040.11 ml  Output 1855 ml  Net 185.11 ml     Assessment:  73 y.o. male is s/p:  Right popliteal to popliteal artery bypass with GSV for popliteal aneurysm  1 Day Post-Op  Plan: -pt doing well this morning with patent bypass with palpable right DP pulse. -pt ambulated some yesterday.  -foley out this am-has  voided since removal. -needs to ambulate more this morning in the hallways -DVT prophylaxis:  Sq heparin -most likely discharge home later today and f/u with Dr. Donnetta Hutching in 2-3 weeks.   Leontine Locket, PA-C [facility] [phone] 12/01/2018 7:18 AM   I have examined the patient, reviewed and agree with above.  Has been up walking with physical therapy.  Somewhat unstable due to discomfort around his knee.  Will hold discharge until tomorrow assuming he is comfortable with walking tomorrow  Curt Jews, MD 12/01/2018 10:34 AM

## 2018-12-01 NOTE — Progress Notes (Signed)
PT Cancellation Note  Patient Details Name: Dennis [name] MRN: [id number] DOB: 1945/12/11   Cancelled Treatment:    Reason Eval/Treat Not Completed: Pain limiting ability to participate;Fatigue/lethargy limiting ability to participate.  Pt states he cannot tolerate any more therapy today, asking to wait to tomorrow.  Will try again at another time.   Ramond Dial 12/01/2018, 2:41 PM  Mee Hives, PT MS Acute Rehab Dept. Number: [place]

## 2018-12-01 NOTE — TOC Transition Note (Signed)
Transition of Care [facility]) - CM/SW Discharge Note   Patient Details  Name: Dennis [name] MRN: [id number] Date of Birth: 1946/04/27  Transition of Care [facility]) CM/SW Contact:  Claudie Leach, RN Phone Number: 12/01/2018, 4:13 PM   Clinical Narrative:    Pt to d/c home with wife. DME to be delivered to room today.   Final next level of care: Home/Self Care Barriers to Discharge: Other (comment)(pain)   Discharge Plan and Services  DME Arranged: 3-N-1, Walker rolling DME Agency: [employer] Date DME Agency Contacted: 12/01/18 Time DME Agency Contacted: [phone] Representative spoke with at DME Agency: Jeneen Rinks

## 2018-12-02 NOTE — Progress Notes (Signed)
Pt ambulated in room with RN. Pt stated he had small amount of pain in right leg when walking but had improved from yesterday. No other distress noted or complaints voiced.

## 2018-12-02 NOTE — Progress Notes (Addendum)
  Progress Note    12/02/2018 7:12 AM 2 Days Post-Op  Subjective:  Says he is glad he stayed an extra day due to pain.   Afebrile HR 50's-70's NSR 993'Z-169'C systolic Dennis% RA  Vitals:   12/01/18 2305 12/02/18 0502  BP: 139/72 (!) 153/73  Pulse: (!) 58 73  Resp: 18 17  Temp: 98 F (36.7 C) 98.4 F (36.9 C)  SpO2: 97% 98%    Physical Exam: Cardiac:  regular Lungs:  Non labored Incisions:  AK and BK incisions are healing nicely Extremities:  Easily palpable right DP pulse   CBC    Component Value Date/Time   WBC 9.5 12/01/2018 0414   RBC 4.24 12/01/2018 0414   HGB 12.6 (L) 12/01/2018 0414   HGB 14.2 11/10/2016 1050   HCT 38.7 (L) 12/01/2018 0414   HCT 41.6 11/10/2016 1050   PLT 204 12/01/2018 0414   PLT 222 11/10/2016 1050   MCV 91.3 12/01/2018 0414   MCV 88 11/10/2016 1050   MCH 29.7 12/01/2018 0414   MCHC 32.6 12/01/2018 0414   RDW 13.3 12/01/2018 0414   RDW 13.7 11/10/2016 1050   LYMPHSABS 1.3 11/10/2016 1050   MONOABS 0.6 11/07/2016 2003   EOSABS 0.1 11/10/2016 1050   BASOSABS 0.0 11/10/2016 1050    BMET    Component Value Date/Time   NA 140 12/01/2018 0414   NA 139 11/10/2016 1050   K 4.0 12/01/2018 0414   CL 106 12/01/2018 0414   CO2 25 12/01/2018 0414   GLUCOSE 125 (H) 12/01/2018 0414   BUN 15 12/01/2018 0414   BUN 16 11/10/2016 1050   CREATININE 1.19 12/01/2018 0414   CALCIUM 8.8 (L) 12/01/2018 0414   GFRNONAA >60 12/01/2018 0414   GFRAA >60 12/01/2018 0414    INR    Component Value Date/Time   INR 1.1 11/28/2018 1600     Intake/Output Summary (Last 24 hours) at 12/02/2018 9381 Last data filed at 12/02/2018 0501 Gross per 24 hour  Intake 570 ml  Output 775 ml  Net -205 ml     Assessment:  73 y.o. Dennis [name] is s/p:  Right popliteal to popliteal artery bypass with GSV for popliteal aneurysm  2 Days Post-Op  Plan: -easily palpable right DP pulse -will ambulate this am  Discharge home later this morning and f/u with Dr. Donnetta Hutching in  a couple of weeks -RLE swelling discussed with pt    Leontine Locket, PA-C [facility] [phone] 12/02/2018 7:12 AM   I have examined the patient, reviewed and agree with above.  Curt Jews, MD 12/02/2018 8:49 AM   I have examined the patient, reviewed and agree with above.  Curt Jews, MD 12/02/2018 8:52 AM

## 2018-12-04 NOTE — Op Note (Signed)
° ° °  OPERATIVE REPORT  DATE OF SURGERY: 12/04/2018  PATIENT: Dennis [name], 73 y.o. male MRN: [id number]  DOB: April 12, 1946  PRE-OPERATIVE DIAGNOSIS: Right popliteal artery aneurysm  POST-OPERATIVE DIAGNOSIS:  Same  PROCEDURE: Right above-knee to below-knee popliteal bypass with reversed great saphenous vein  SURGEON:  Curt Jews, M.D.  PHYSICIAN ASSISTANT: Liana Crocker, PA-C  ANESTHESIA: General  EBL: per anesthesia record  No intake/output data recorded.  BLOOD ADMINISTERED: none  DRAINS: none  SPECIMEN: none  COUNTS CORRECT:  YES  PATIENT DISPOSITION:  PACU - hemodynamically stable  PROCEDURE DETAILS: Patient was taken the operating placed supine position where the area of the right leg is prepped draped in sterile fashion.  SonoSite ultrasound was used to visualize the saphenous vein and this was marked on the surface of the skin.  Incision was made over the saphenous vein and the vein was of very good caliber.  Tributary branches were ligated with 3-0 and 4-0 silk ties and divided.  The above-knee popliteal artery was exposed through the vein harvest incision at the level of the abductor canal.  The artery was somewhat enlarged at this level but was not aneurysmal.  The patient did have a large 4-1/2 cm aneurysm behind his knee.  Next the below-knee popliteal artery was exposed through the vein harvest incision below the knee.  The artery was exposed above the level of the takeoff of the anterior tibial artery.  The anterior tibial artery was also exposed.  The popliteal artery above the anterior tibial takeoff was of normal caliber.  The vein was harvested after ligating proximally and distally and was gently dilated with heparinized saline and was of excellent caliber.  A tunnel was created from the above-knee to the below-knee position.  The patient was given 8000 units of intravenous heparin and after adequate circulation time the popliteal artery was ligated with #5  Ethibond above the popliteal aneurysm and was ligated below the popliteal artery aneurysm in the lower segment of the below-knee popliteal exposure.  The superficial femoral artery was occluded with a angled DeBakey clamp and the superficial femoral artery was open with an 11 blade and sent longitudinally with Potts scissors.  The vein was spatulated and sewn end-to-side to the distal suprasternal artery above the ligation with a running 5-0 Prolene suture.  This anastomosis was tested and found to be adequate.  The vein was then brought to the prior created tunnel down to the level of the below-knee popliteal artery.  There was no compression from the aneurysm sac itself.  The anterior tibial artery and tibioperoneal trunk were occluded with vascular loops.  The popliteal artery was transected and spatulated.  The vein was cut to the appropriate length and was sewn into into the distal popliteal artery with a running 5-0 Prolene suture.  Prior to completion of the closure the usual flushing maneuvers were undertaken anastomosis completed.  The patient had easily palpable dorsalis pedis pulse.  Patient given 50 mg of protamine to reverse the heparin.  Wounds irrigated with saline hemostasis was obtained electrocautery.  The wounds were closed with 2-0 Vicryl in the fascia and a running.  The skin was closed with 3-0 subcuticular Vicryl sutures.  Sterile dressing was applied and the patient was transferred to the recovery room in stable condition   Rosetta Posner, M.D., [facility] 12/04/2018 3:39 PM

## 2018-12-05 ENCOUNTER — Telehealth: Payer: Self-pay | Admitting: *Deleted

## 2018-12-05 NOTE — Telephone Encounter (Signed)
Called Triage c/o swelling in foot. Surgery 11/30/2018. Claims little pain, no sign of infection at incisions and lower extremity is pink and warm "no blue toes". Instructed to take periods of rest between periods of activity and elevate leg above level of heart. To call office for any worsening condition.

## 2018-12-10 ENCOUNTER — Telehealth: Payer: Self-pay

## 2018-12-10 NOTE — Telephone Encounter (Signed)
Pt called and said that he is still having some swelling in his leg and wanted to know if he should take a diuretic to help with that.   Called patient and he said that he was doing elevation and had used some ice on his leg. He said that he is not having any discoloration, no heat to the touch and no pain. He said that overall he just isnt happy with the swelling. Offered an appt but patient refused and said that he will just continue to use elevation. Advised him to move his foot up and down while it is elevated as well to help promote good blood flow.   Told him to call back if he did not improve.   York Cerise, CMA

## 2018-12-11 ENCOUNTER — Encounter: Payer: Self-pay | Admitting: Vascular Surgery

## 2018-12-11 ENCOUNTER — Ambulatory Visit: Payer: Medicare HMO | Admitting: Vascular Surgery

## 2018-12-11 ENCOUNTER — Other Ambulatory Visit: Payer: Self-pay

## 2018-12-11 ENCOUNTER — Telehealth: Payer: Self-pay | Admitting: *Deleted

## 2018-12-11 VITALS — BP 146/90 | HR 60 | Temp 96.9°F | Resp 16 | Ht 68.0 in | Wt 214.0 lb

## 2018-12-11 DIAGNOSIS — I724 Aneurysm of artery of lower extremity: Secondary | ICD-10-CM

## 2018-12-11 NOTE — Progress Notes (Signed)
   Patient name: Dennis [name] MRN: [id number] DOB: 06-02-1946 Sex: male  REASON FOR VISIT: Follow-up right popliteal aneurysm repair  HPI: Dennis [name] is a 73 y.o. male here today for follow-up.  He underwent uneventful right popliteal artery aneurysm surgery on 11/30/2018.  He called today with concerns erythema at the incision.  He reports the expected amount of swelling following surgery but is been quite active and is walking without difficulty.  He has no ischemic symptoms  Current Outpatient Medications  Medication Sig Dispense Refill  . ascorbic acid (VITAMIN C) 1000 MG tablet Take 1,000 mg by mouth daily.    Marland Kitchen aspirin 81 MG tablet Take 81 mg by mouth every evening.     . Cholecalciferol (VITAMIN D) 50 MCG (2000 UT) tablet Take 4,000 Units by mouth daily.    . clopidogrel (PLAVIX) 75 MG tablet Take 1 tablet by mouth once daily (Patient taking differently: Take 75 mg by mouth daily. ) 90 tablet 0  . fenofibrate 160 MG tablet Take 160 mg by mouth daily.    . furosemide (LASIX) 20 MG tablet Take 20 mg by mouth daily as needed for fluid.     Marland Kitchen gabapentin (NEURONTIN) 300 MG capsule Take 1 capsule (300 mg total) by mouth daily. (Patient taking differently: Take 300 mg by mouth daily as needed (pain). ) 30 capsule 0  . levothyroxine (SYNTHROID, LEVOTHROID) 112 MCG tablet Take 112 mcg by mouth daily.    . magnesium oxide (MAG-OX) 400 MG tablet Take 400 mg by mouth daily.    . metoprolol succinate (TOPROL-XL) 100 MG 24 hr tablet Take 150 mg by mouth daily.     . nitroGLYCERIN (NITROSTAT) 0.4 MG SL tablet Place 1 tablet (0.4 mg total) under the tongue every 5 (five) minutes as needed for chest pain. 25 tablet 2  . omega-3 acid ethyl esters (LOVAZA) 1 g capsule Take 1 g by mouth daily.     Earney Navy Bicarbonate (ZEGERID OTC) 20-1100 MG CAPS capsule Take 1 capsule by mouth daily as needed (acid reflux).    Marland Kitchen oxyCODONE-acetaminophen (PERCOCET)  5-325 MG tablet Take 1 tablet by mouth every 6 (six) hours as needed for severe pain. 20 tablet 0  . ramipril (ALTACE) 10 MG capsule Take 10 mg by mouth every evening.     Marland Kitchen amLODipine (NORVASC) 5 MG tablet Take 1 tablet (5 mg total) by mouth daily. 90 tablet 3   No current facility-administered medications for this visit.      PHYSICAL EXAM: Vitals:   12/11/18 1344  BP: (!) 146/90  Pulse: 60  Resp: 16  Temp: (!) 96.9 F (36.1 C)  TempSrc: Temporal  SpO2: 97%  Weight: 214 lb (97.1 kg)  Height: 5\' 8"  (1.727 m)    GENERAL: The patient is a well-nourished male, in no acute distress. The vital signs are documented above. Does have mild erythema in the incision on the medial aspect of his above-knee and below-knee exposure.  No fluctuance or drainage.  2+ right dorsalis pedis pulse  MEDICAL ISSUES: Normal expected follow-up after surgery.  Discussed this with the patient understands.  He will continue his walking program and will see [place] again in 6 weeks with duplex at that time   Rosetta Posner, MD [facility] Tel [phone] Pager [phone]

## 2018-12-11 NOTE — Telephone Encounter (Signed)
Patient called and requested an appointment. States no improvement in swelling despite best efforts. Increase in warmth and redness at incision. Denies fever or chills, drainage. Claims toes are pink, good sensation, and temperature. Appt given to see NP 12/12/2018 will call this pm to be moved up earlier if cancellation. Instructed to go to [facility] ER for worsening condition.

## 2018-12-12 ENCOUNTER — Ambulatory Visit: Payer: Medicare HMO | Admitting: Family

## 2018-12-19 ENCOUNTER — Telehealth: Payer: Self-pay | Admitting: *Deleted

## 2018-12-19 NOTE — Telephone Encounter (Signed)
A message was left, re: follow up visit. 

## 2018-12-24 ENCOUNTER — Telehealth: Payer: Self-pay | Admitting: Cardiovascular Disease

## 2018-12-24 DIAGNOSIS — R69 Illness, unspecified: Secondary | ICD-10-CM | POA: Diagnosis not present

## 2018-12-24 NOTE — Telephone Encounter (Signed)
Dr. Gwenlyn Found  Could you please comment on this patient's DAPT with ASA and Plavix?  You last saw him 06/2018 for follow-up of CAD.  Has a history of CAD with stenting to the RCA/LCx in 2002, stenting to LCx in 2016 and last cath 2018 which revealed a 70% AV groove lesion with stenting.  He also follows with [facility], Dr. Donnetta Hutching.  Recently underwent a right above to below the knee popliteal bypass for right popliteal artery aneurysm 0/30/0923 without complications.   Patient is to undergo a removal of a bony spur on his mandible and requesting office is asking about Plavix/ASA holding and restarting.  Thank you  Sharee Pimple

## 2018-12-24 NOTE — Telephone Encounter (Signed)
1. What dental office are you calling from? Dr Janice Coffin   2. What is your office phone number? [phone]   3. What is your fax number? [phone]  4. What type of procedure is the patient having performed?  Bony Spurr on Mandibulae on Tongue Size   5. What date is procedure scheduled or is the patient there now?  TBD  (if the patient is at the dentist's office question goes to their cardiologist if he/she is in the office.  If not, question should go to the DOD).   6. What is your question (ex. Antibiotics prior to procedure, holding medication-we need to know how long dentist wants pt to hold med)? How many days prior to procedure should pt stop his Plavix and how many days after his procedure should he start back?

## 2018-12-25 ENCOUNTER — Encounter: Payer: Medicare HMO | Admitting: Vascular Surgery

## 2018-12-25 NOTE — Telephone Encounter (Signed)
LMTCB 12/25/2018 1242pm

## 2018-12-25 NOTE — Telephone Encounter (Signed)
OK to interrupt anti platelet Rx for Dental procedure

## 2018-12-26 ENCOUNTER — Telehealth: Payer: Self-pay | Admitting: Cardiology

## 2018-12-26 NOTE — Telephone Encounter (Signed)
    Per Dr. Gwenlyn Found, it will be acceptable to hold antiplatelet therapy for 1-2 days prior to procedure as to not exceed 5 days without re-evaluation.    Kathyrn Drown NP-C [place] Pager: [phone]

## 2018-12-26 NOTE — Telephone Encounter (Signed)
   Primary Cardiologist: [name], MD  Chart reviewed as part of pre-operative protocol coverage. Patient was contacted 12/26/2018 in reference to pre-operative risk assessment for pending surgery as outlined below.  Dennis [name] was last seen on 06/12/2018 by Dr. Gwenlyn Found.  Since that day, Dennis [name] has done well from a cardiac perspective. He denies recurrent anginal symptoms. He underwent a right above to below the knee popliteal bypass for right popliteal artery aneurysm repair on 11/30/2018. He reports that he held his Plavix 5 days prior without complications. He has since resumed. He follows with Dr. Donnetta Hutching for this.   Per Dr. Gwenlyn Found, it is acceptable to interrupt his antiplatelet therapy for this procedure.   Therefore, based on ACC/AHA guidelines, the patient would be at acceptable risk for the planned procedure without further cardiovascular testing.   I will route this recommendation to the requesting party via Epic fax function and remove from pre-op pool.  Please call with questions.  Kathyrn Drown, NP 12/26/2018, 8:38 AM

## 2018-12-26 NOTE — Telephone Encounter (Signed)
New Message    Patient returning your call about medication prior to surgery.

## 2018-12-31 DIAGNOSIS — R69 Illness, unspecified: Secondary | ICD-10-CM | POA: Diagnosis not present

## 2019-01-22 ENCOUNTER — Other Ambulatory Visit: Payer: Self-pay

## 2019-01-22 ENCOUNTER — Encounter: Payer: Self-pay | Admitting: Vascular Surgery

## 2019-01-22 ENCOUNTER — Ambulatory Visit (INDEPENDENT_AMBULATORY_CARE_PROVIDER_SITE_OTHER): Payer: Self-pay | Admitting: Vascular Surgery

## 2019-01-22 VITALS — BP 139/89 | HR 66 | Temp 97.8°F | Resp 18 | Ht 68.0 in | Wt 220.0 lb

## 2019-01-22 DIAGNOSIS — I724 Aneurysm of artery of lower extremity: Secondary | ICD-10-CM

## 2019-01-22 NOTE — Progress Notes (Signed)
Patient name: Dennis [name] MRN: [id number] DOB: [birth year] Sex: male  REASON FOR VISIT: Follow-up repair of popliteal artery aneurysm  HPI: Dennis [name] is a 73 y.o. male here for follow-up of popliteal artery aneurysm repair.  He underwent a right above-knee to below-knee popliteal bypass with saphenous vein on 626.  He had had a very large popliteal artery aneurysm.  He was seen several weeks postop with some erythema in the skin incision.  This is completely resolved.  He is walking with no claudication symptoms.  He does have some peri-incisional numbness in both his vein harvest incision and his distal below-knee popliteal incision  Current Outpatient Medications  Medication Sig Dispense Refill  . ascorbic acid (VITAMIN C) 1000 MG tablet Take 1,000 mg by mouth daily.    Marland Kitchen aspirin 81 MG tablet Take 81 mg by mouth every evening.     . Cholecalciferol (VITAMIN D) 50 MCG (2000 UT) tablet Take 4,000 Units by mouth daily.    . clopidogrel (PLAVIX) 75 MG tablet Take 1 tablet by mouth once daily (Patient taking differently: Take 75 mg by mouth daily. ) 90 tablet 0  . fenofibrate 160 MG tablet Take 160 mg by mouth daily.    . furosemide (LASIX) 20 MG tablet Take 20 mg by mouth daily as needed for fluid.     Marland Kitchen gabapentin (NEURONTIN) 300 MG capsule Take 1 capsule (300 mg total) by mouth daily. (Patient taking differently: Take 300 mg by mouth daily as needed (pain). ) 30 capsule 0  . levothyroxine (SYNTHROID, LEVOTHROID) 112 MCG tablet Take 112 mcg by mouth daily.    . magnesium oxide (MAG-OX) 400 MG tablet Take 400 mg by mouth daily.    . metoprolol succinate (TOPROL-XL) 100 MG 24 hr tablet Take 150 mg by mouth daily.     . nitroGLYCERIN (NITROSTAT) 0.4 MG SL tablet Place 1 tablet (0.4 mg total) under the tongue every 5 (five) minutes as needed for chest pain. 25 tablet 2  . omega-3 acid ethyl esters (LOVAZA) 1 g capsule Take 1 g by mouth daily.      Earney Navy Bicarbonate (ZEGERID OTC) 20-1100 MG CAPS capsule Take 1 capsule by mouth daily as needed (acid reflux).    . ramipril (ALTACE) 10 MG capsule Take 10 mg by mouth every evening.     Marland Kitchen amLODipine (NORVASC) 5 MG tablet Take 1 tablet (5 mg total) by mouth daily. 90 tablet 3  . oxyCODONE-acetaminophen (PERCOCET) 5-325 MG tablet Take 1 tablet by mouth every 6 (six) hours as needed for severe pain. (Patient not taking: Reported on 01/22/2019) 20 tablet 0   No current facility-administered medications for this visit.      PHYSICAL EXAM: Vitals:   01/22/19 0924  BP: 139/89  Pulse: 66  Resp: 18  Temp: 97.8 F (36.6 C)  TempSrc: Temporal  Weight: 220 lb (99.8 kg)  Height: 5\' 8"  (1.727 m)    GENERAL: The patient is a well-nourished male, in no acute distress. The vital signs are documented above. Incisions are all healed quite nicely.  He has easily palpable popliteal pulse and 2+ dorsalis pedis pulse  MEDICAL ISSUES: Stable overall.  I had planned to have noninvasive studies today which did not get done.  We will obtain these in the next week or 2 at his convenience and notify him of the results.  I will see him again in 9 months with repeat office visit and noninvasive studies   Arvilla Meres.  [name] Polinsky, MD FACS [facility] Tel [phone] Pager [phone]

## 2019-01-23 ENCOUNTER — Telehealth (HOSPITAL_COMMUNITY): Payer: Self-pay | Admitting: Rehabilitation

## 2019-01-23 NOTE — Telephone Encounter (Signed)

## 2019-01-24 ENCOUNTER — Other Ambulatory Visit: Payer: Self-pay

## 2019-01-24 ENCOUNTER — Ambulatory Visit (HOSPITAL_COMMUNITY)
Admission: RE | Admit: 2019-01-24 | Discharge: 2019-01-24 | Disposition: A | Discharge status: Home / Self Care | Payer: Medicare HMO | Source: Ambulatory Visit | Attending: Family | Admitting: Family

## 2019-01-24 DIAGNOSIS — I724 Aneurysm of artery of lower extremity: Secondary | ICD-10-CM | POA: Insufficient documentation

## 2019-01-25 ENCOUNTER — Other Ambulatory Visit: Payer: Self-pay | Admitting: Cardiovascular Disease

## 2019-01-31 DIAGNOSIS — D044 Carcinoma in situ of skin of scalp and neck: Secondary | ICD-10-CM | POA: Diagnosis not present

## 2019-01-31 DIAGNOSIS — L570 Actinic keratosis: Secondary | ICD-10-CM | POA: Diagnosis not present

## 2019-01-31 DIAGNOSIS — D485 Neoplasm of uncertain behavior of skin: Secondary | ICD-10-CM | POA: Diagnosis not present

## 2019-01-31 DIAGNOSIS — Z85828 Personal history of other malignant neoplasm of skin: Secondary | ICD-10-CM | POA: Diagnosis not present

## 2019-02-07 DIAGNOSIS — Z23 Encounter for immunization: Secondary | ICD-10-CM | POA: Diagnosis not present

## 2019-03-08 ENCOUNTER — Other Ambulatory Visit: Payer: Self-pay

## 2019-03-08 ENCOUNTER — Encounter: Payer: Self-pay | Admitting: Cardiovascular Disease

## 2019-03-08 ENCOUNTER — Ambulatory Visit: Payer: Medicare HMO | Admitting: Cardiovascular Disease

## 2019-03-08 VITALS — BP 147/84 | HR 102 | Temp 97.3°F | Ht 68.0 in | Wt 225.0 lb

## 2019-03-08 DIAGNOSIS — I10 Essential (primary) hypertension: Secondary | ICD-10-CM | POA: Diagnosis not present

## 2019-03-08 DIAGNOSIS — Z008 Encounter for other general examination: Secondary | ICD-10-CM | POA: Diagnosis not present

## 2019-03-08 DIAGNOSIS — I724 Aneurysm of artery of lower extremity: Secondary | ICD-10-CM | POA: Diagnosis not present

## 2019-03-08 DIAGNOSIS — I2510 Atherosclerotic heart disease of native coronary artery without angina pectoris: Secondary | ICD-10-CM

## 2019-03-08 DIAGNOSIS — E785 Hyperlipidemia, unspecified: Secondary | ICD-10-CM

## 2019-03-08 DIAGNOSIS — Z9861 Coronary angioplasty status: Secondary | ICD-10-CM | POA: Diagnosis not present

## 2019-03-08 NOTE — Assessment & Plan Note (Addendum)
History of CAD status post RCA and circumflex intervention by Dr. Glade Lloyd back in 2002.  He underwent cardiac catheterization during admission for unstable angina by Dr. Claiborne Billings 09/30/2014 revealing high-grade mid circumflex lesion on the band which was stented 2 days later by Dr. Angelena Form with a Promus Premier drug-eluting stent postdilated to 4.5 mm.  Because of recurrent chest pain I recatheterized him via the right radial approach 11/17/2016 revealing a widely patent proximal circumflex stent, widely patent RCA with a 70% lesion in the AV groove circumflex beyond the previously stented segment.  I performed FFR which was 0.7 suggesting that this was physiologically significant and stented him with a 3 mm x 12 mm Synergy drug-eluting stent postdilated up to 3.25 mm.  Since that time he is remained stable without chest pain

## 2019-03-08 NOTE — Assessment & Plan Note (Signed)
History of dyslipidemia on Crestor in the past but apparently statin intolerant with lipid profile performed 06/29/2018 revealing total cholesterol 145, LDL 76 and HDL of 26.  He is currently not on statin therapy.  We will recheck a lipid liver profile.

## 2019-03-08 NOTE — Progress Notes (Signed)
03/08/2019 [name]   06-04-1946  [id number]  Primary Physician Merrilee Seashore, MD Primary Cardiologist: Lorretta Harp MD [name], [place]  HPI:  Dennis [name] is a 73 y.o.  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 He is a patient of Dr. Thayer Jew Pharr's.  I last saw him in the office  06/12/2018.Marland Kitchen History of hyperlipidemia and CAD status post RCA and circumflex intervention by Dr. Glade Lloyd back in 2002. He was admitted to [facility] 09/30/14 with unstable angina. He underwent cardiac catheterization that day by Dr. Claiborne Billings revealing a high-grade mid circumflex lesion on a bend and was stented 2 days later by Dr. Julianne Handler with a Promus premiere drug eluting stent postdilated with a 4.5 mm balloon.When I saw him 3 months ago he had experienced several episodes of nitrate responsive chest pain. I ultimately decided to perform outpatient cardiac catheterization. The right radial approach on 11/17/16 revealing a widely patent proximal circumflex stent, widely patent RCA with 70% lesion in the AV groove circumflex beyond the previously stented segment. I performed FFR which was 0.7 suggesting this was physiologically significant and stented that with a 3 mm x 12 mm long synergy drug-eluting stent post dilating up to 3.25 mm. This pain has since Since I saw him in the office in January this year he did develop pain in his right leg with some swelling.  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  He ultimately had right above to below the knee bypass grafting by Dr. Sherren Mocha Early 11/30/2018 which he has still slowly healing from.  He denies chest pain or shortness of breath.  Current Meds  Medication Sig   ascorbic acid (VITAMIN C) 1000 MG tablet Take 1,000 mg by mouth daily.   aspirin 81 MG tablet Take 81 mg by mouth every evening.    Cholecalciferol (VITAMIN  D) 50 MCG (2000 UT) tablet Take 4,000 Units by mouth daily.   clopidogrel (PLAVIX) 75 MG tablet Take 1 tablet by mouth once daily   fenofibrate 160 MG tablet Take 160 mg by mouth daily.   furosemide (LASIX) 20 MG tablet Take 20 mg by mouth daily as needed for fluid.    gabapentin (NEURONTIN) 300 MG capsule Take 1 capsule (300 mg total) by mouth daily. (Patient taking differently: Take 300 mg by mouth daily as needed (pain). )   levothyroxine (SYNTHROID, LEVOTHROID) 112 MCG tablet Take 112 mcg by mouth daily.   magnesium oxide (MAG-OX) 400 MG tablet Take 400 mg by mouth daily.   metoprolol succinate (TOPROL-XL) 100 MG 24 hr tablet Take 150 mg by mouth daily.    nitroGLYCERIN (NITROSTAT) 0.4 MG SL tablet Place 1 tablet (0.4 mg total) under the tongue every 5 (five) minutes as needed for chest pain.   omega-3 acid ethyl esters (LOVAZA) 1 g capsule Take 1 g by mouth daily.    Omeprazole-Sodium Bicarbonate (ZEGERID OTC) 20-1100 MG CAPS capsule Take 1 capsule by mouth daily as needed (acid reflux).   ramipril (ALTACE) 10 MG capsule Take 10 mg by mouth every evening.    [DISCONTINUED] oxyCODONE-acetaminophen (PERCOCET) 5-325 MG tablet Take 1 tablet by mouth every 6 (six) hours as needed for severe pain.     No Known Allergies  Social History   Socioeconomic History   Marital status: Married    Spouse name: Not on file   Number of children:  Not on file   Years of education: Not on file   Highest education level: Not on file  Occupational History   Not on file  Social Needs   Financial resource strain: Not on file   Food insecurity    Worry: Not on file    Inability: Not on file   Transportation needs    Medical: Not on file    Non-medical: Not on file  Tobacco Use   Smoking status: Former Smoker    Packs/day: 1.00    Years: 40.00    Pack years: 40.00    Types: Cigarettes    Quit date: 03/06/2001    Years since quitting: 18.0   Smokeless tobacco: Never Used    Substance and Sexual Activity   Alcohol use: Yes    Comment: occ   Drug use: No   Sexual activity: Not on file  Lifestyle   Physical activity    Days per week: Not on file    Minutes per session: Not on file   Stress: Not on file  Relationships   Social connections    Talks on phone: Not on file    Gets together: Not on file    Attends religious service: Not on file    Active member of club or organization: Not on file    Attends meetings of clubs or organizations: Not on file    Relationship status: Not on file   Intimate partner violence    Fear of current or ex partner: Not on file    Emotionally abused: Not on file    Physically abused: Not on file    Forced sexual activity: Not on file  Other Topics Concern   Not on file  Social History Narrative   Not on file     Review of Systems: General: negative for chills, fever, night sweats or weight changes.  Cardiovascular: negative for chest pain, dyspnea on exertion, edema, orthopnea, palpitations, paroxysmal nocturnal dyspnea or shortness of breath Dermatological: negative for rash Respiratory: negative for cough or wheezing Urologic: negative for hematuria Abdominal: negative for nausea, vomiting, diarrhea, bright red blood per rectum, melena, or hematemesis Neurologic: negative for visual changes, syncope, or dizziness All other systems reviewed and are otherwise negative except as noted above.    Blood pressure (!) 147/84, pulse (!) 102, temperature (!) 97.3 F (36.3 C), height 5\' 8"  (1.727 m), weight 225 lb (102.1 kg), SpO2 99 %.  General appearance: alert and no distress Neck: no adenopathy, no carotid bruit, no JVD, supple, symmetrical, trachea midline and thyroid not enlarged, symmetric, no tenderness/mass/nodules Lungs: clear to auscultation bilaterally Heart: regular rate and rhythm, S1, S2 normal, no murmur, click, rub or gallop Extremities: extremities normal, atraumatic, no cyanosis or  edema Pulses: 2+ and symmetric Skin: Skin color, texture, turgor normal. No rashes or lesions Neurologic: Alert and oriented X 3, normal strength and tone. Normal symmetric reflexes. 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  I personally reviewed this EKG.  ASSESSMENT AND PLAN:   Dyslipidemia History of dyslipidemia on Crestor in the past but apparently statin intolerant with lipid profile performed 06/29/2018 revealing total cholesterol 145, LDL 76 and HDL of 26.  He is currently not on statin therapy.  We will recheck a lipid liver profile.  CAD S/P multiple PCIs History of CAD status post RCA and circumflex intervention by Dr. Glade Lloyd back in 2002.  He underwent  cardiac catheterization during admission for unstable angina by Dr. Claiborne Billings 09/30/2014 revealing high-grade mid circumflex lesion on the band which was stented 2 days later by Dr. Angelena Form with a Promus Premier drug-eluting stent postdilated to 4.5 mm.  Because of recurrent chest pain I recatheterized him via the right radial approach 11/17/2016 revealing a widely patent proximal circumflex stent, widely patent RCA with a 70% lesion in the AV groove circumflex beyond the previously stented segment.  I performed FFR which was 0.7 suggesting that this was physiologically significant and stented him with a 3 mm x 12 mm Synergy drug-eluting stent postdilated up to 3.25 mm.  Since that time he is remained stable without chest pain  Essential hypertension History of essential hypertension with blood pressure measured today at 147/84.  He is on metoprolol, amlodipine and ramipril.  Popliteal artery aneurysm [facility]) History of recent right popliteal artery aneurysm repair on 11/30/2018 by Dr. Sherren Mocha Early with a right above to below the knee bypass graft operation.      Lorretta Harp MD FACP,FACC,FAHA, FSCAI 03/08/2019 2:10 PM

## 2019-03-08 NOTE — Assessment & Plan Note (Signed)
History of recent right popliteal artery aneurysm repair on 11/30/2018 by Dr. Sherren Mocha Early with a right above to below the knee bypass graft operation.

## 2019-03-08 NOTE — Assessment & Plan Note (Signed)
History of essential hypertension with blood pressure measured today at 147/84.  He is on metoprolol, amlodipine and ramipril.

## 2019-03-08 NOTE — Patient Instructions (Addendum)
Medication Instructions:  Your physician recommends that you continue on your current medications as directed. Please refer to the Current Medication list given to you today.  If you need a refill on your cardiac medications before your next appointment, please call your pharmacy.   Lab work: Your physician recommends that you return for lab work within 1 week: [place]  If you have labs (blood work) drawn today and your tests are completely normal, you will receive your results only by: Marland Kitchen MyChart Message (if you have MyChart) OR . A paper copy in the mail If you have any lab test that is abnormal or we need to change your treatment, we will call you to review the results.  Testing/Procedures: NONE  Follow-Up: At [facility], you and your health needs are our priority.  As part of our continuing mission to provide you with exceptional heart care, we have created designated Provider Care Teams.  These Care Teams include your primary Cardiologist (physician) and Advanced Practice Providers (APPs -  Physician Assistants and Nurse Practitioners) who all work together to provide you with the care you need, when you need it. You will need a follow up appointment in 12 months with Dr. [name].  Please call our office 2 months in advance to schedule this appointment.

## 2019-03-19 ENCOUNTER — Telehealth: Payer: Self-pay | Admitting: Cardiovascular Disease

## 2019-03-19 NOTE — Telephone Encounter (Signed)
Returned call to patient to discuss leg swelling, LVM Left call back number.

## 2019-03-19 NOTE — Telephone Encounter (Signed)
° ° °  Patient calling to report right ankle swelling. Patient would like to stop taking amLODipine (NORVASC) 5 MG tablet

## 2019-03-20 NOTE — Telephone Encounter (Signed)
Follow up:    Patient returning call back. Please call patient back.

## 2019-03-20 NOTE — Telephone Encounter (Signed)
Spoke to pt regarding ankle swelling. He states he thinks it's from the Amlodipine d/t it being a side effect. He also stated that he had R leg surgery June 23rd for an aneurysm and the swelling hasn't decreased since. He also said both ankles are swelling. Asked if the pt was feeling SOB or having a hard time breathing while lying flat, denied. Asked the pt if he was taking lasix. He denied and said he has never taken it before. Educated that with heart issues, fluid build up is common and lasix helps pull fluid off. Advised to take a dose of lasix to see if it helps swelling. Also sent in [facility] Rx d/t being expired. Advised that this message would be sent to Dr. Gwenlyn Found for further evaluation.

## 2019-03-21 NOTE — Telephone Encounter (Signed)
lvm to set up a visit with the APP per Dr. Gwenlyn Found.

## 2019-03-21 NOTE — Telephone Encounter (Signed)
Have patient come in to be evaluated by an APP next week for his lower extremity edema.

## 2019-03-21 NOTE — Telephone Encounter (Signed)
Follow Up  Patient is calling back in about the medication and getting his questions answered. Please give patient a call back.

## 2019-03-25 ENCOUNTER — Ambulatory Visit: Payer: Medicare HMO | Admitting: Adult Health
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
## 2019-03-27 ENCOUNTER — Other Ambulatory Visit: Payer: Self-pay

## 2019-03-27 ENCOUNTER — Encounter: Payer: Self-pay | Admitting: Adult Health

## 2019-03-27 ENCOUNTER — Ambulatory Visit: Payer: Medicare HMO | Admitting: Adult Health

## 2019-03-27 VITALS — BP 160/100 | HR 88 | Ht 68.0 in | Wt 220.0 lb

## 2019-03-27 DIAGNOSIS — I10 Essential (primary) hypertension: Secondary | ICD-10-CM

## 2019-03-27 DIAGNOSIS — I724 Aneurysm of artery of lower extremity: Secondary | ICD-10-CM | POA: Diagnosis not present

## 2019-03-27 DIAGNOSIS — I2510 Atherosclerotic heart disease of native coronary artery without angina pectoris: Secondary | ICD-10-CM | POA: Diagnosis not present

## 2019-03-27 DIAGNOSIS — Z79899 Other long term (current) drug therapy: Secondary | ICD-10-CM | POA: Diagnosis not present

## 2019-03-27 DIAGNOSIS — E038 Other specified hypothyroidism: Secondary | ICD-10-CM

## 2019-03-27 MED ORDER — CHLORTHALIDONE 25 MG PO TABS: 25.0000 mg | ORAL_TABLET | Freq: Every day | ORAL | 6 refills | Status: DC

## 2019-03-27 MED ORDER — HYDROCHLOROTHIAZIDE 25 MG PO TABS: 25.0000 mg | ORAL_TABLET | Freq: Every day | ORAL | 6 refills | Status: DC

## 2019-03-27 NOTE — Patient Instructions (Signed)
Medication Instructions:  STOP Amlodipine START- Chlorthalidone 25 mg by mouth daily  If you need a refill on your cardiac medications before your next appointment, please call your pharmacy.  Labwork: BMP in 2 weeks HERE IN OUR OFFICE AT [facility]  You will NOT need to fast   If you have labs (blood work) drawn today and your tests are completely normal, you will receive your results only by: Marland Kitchen MyChart Message (if you have MyChart) OR . A paper copy in the mail If you have any lab test that is abnormal or we need to change your treatment, we will call you to review the results.  Testing/Procedures: None Ordered  Follow-Up: IN 1 Month with Jory Sims  At [facility], you and your health needs are our priority.  As part of our continuing mission to provide you with exceptional heart care, we have created designated Provider Care Teams.  These Care Teams include your primary Cardiologist (physician) and Advanced Practice Providers (APPs -  Physician Assistants and Nurse Practitioners) who all work together to provide you with the care you need, when you need it.  Thank you for choosing [facility] at [facility]!!
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
## 2019-05-01 ENCOUNTER — Ambulatory Visit: Payer: Medicare HMO | Admitting: Adult Health

## 2019-05-05 ENCOUNTER — Other Ambulatory Visit: Payer: Self-pay | Admitting: Cardiovascular Disease

## 2019-05-10 DIAGNOSIS — E785 Hyperlipidemia, unspecified: Secondary | ICD-10-CM | POA: Diagnosis not present

## 2019-05-10 DIAGNOSIS — I2510 Atherosclerotic heart disease of native coronary artery without angina pectoris: Secondary | ICD-10-CM | POA: Diagnosis not present

## 2019-05-17 DIAGNOSIS — I2510 Atherosclerotic heart disease of native coronary artery without angina pectoris: Secondary | ICD-10-CM | POA: Diagnosis not present

## 2019-05-17 DIAGNOSIS — I129 Hypertensive chronic kidney disease with stage 1 through stage 4 chronic kidney disease, or unspecified chronic kidney disease: Secondary | ICD-10-CM | POA: Diagnosis not present

## 2019-05-17 DIAGNOSIS — N182 Chronic kidney disease, stage 2 (mild): Secondary | ICD-10-CM | POA: Diagnosis not present

## 2019-05-17 DIAGNOSIS — E785 Hyperlipidemia, unspecified: Secondary | ICD-10-CM | POA: Diagnosis not present

## 2019-05-17 DIAGNOSIS — R7309 Other abnormal glucose: Secondary | ICD-10-CM | POA: Diagnosis not present

## 2019-05-27 NOTE — Progress Notes (Signed)
Cardiology Office Note   Date:  05/30/2019   ID:  Rolly, Albo 1946/03/04, MRN [id number]  PCP:  Merrilee Seashore, MD  Cardiologist:  Dr.Berry  Follow Up    History of Present Illness: Dennis [name] is a 73 y.o. male who presents for ongoing assessment and management of coronary artery disease.  The patient has stent to the RCA and circumflex which was placed in 2002.  Repeat cardiac catheterization in 2016 revealed high-grade mid circumflex lesion on a bend that was stented by Dr. Angelena Form with a Promus Premier DES, postdilated with 4.5 mm balloon on  10/02/2014.   A repeat left heart cath was completed on 11/17/2016 in the setting of recurrent chest pain revealing widely patent proximal circumflex stent, widely patent RCA with 70% lesion in the AV groove circumflex beyond the previously stented segment.  FFR was 0.7 suggesting this was a physiologically significant lesion and therefore it was stented with a 3 mm x 12 mm long Synergy DES postdilated to 3.25 mm.   He was also noted to have peripheral arterial disease after experiencing pain in his right leg with some swelling.  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  He subsequently had a right above-the-knee bypass grafting by Dr. Sherren Mocha early on 11/30/2018.  At that time labs were drawn to evaluate lipid status.  He was without complaints of recurrent angina.   Dennis [name] was last seen in the office on 03/27/2019 with complaints of chronic lower extremity edema, right greater than left.  He stopped taking amlodipine on his own as he was felt it was making the lower extremity worse.  He did not increase his Lasix as he felt it was causing too much diuresis.  On the last office visit he was found to be hypertensive but stated that he has "white coat syndrome", and has been taking his other medications as directed.  On last visit I added chlorthalidone 25 mg daily to his current medication regimen he was to  follow-up with the BMET and 1 month in person visit to evaluate his response to medication.  He was to follow-up with Dr. Sherren Mocha early for ongoing management of PAD.  He offers no complaints of chest discomfort or dyspnea on exertion.  Dennis [name] ran out of his Plavix for about 3 days, and did have some exertional angina during that time.  He was lifting heavy boxes over his head and noticed some discomfort in his chest.  He did take nitroglycerin sublingual x2 and the pain subsided.  He did refill his Plavix, and has not had any further complaints.  Apparently this was over the holiday and was unable to get refill authorization.  He is now on 90-day supply of Plavix and has not missed any doses.  On review his blood pressure it is much better controlled compared to prior blood pressure recordings with addition of chlorthalidone 25 mg daily.  It is running in the 130s over 70s based upon his blood pressure recording sheets.  Sometimes a little lower first thing in the morning and highest of 150/83 at 10:30 PM.  On average his blood pressure is much better controlled.  Today his pressure is slightly elevated but a good 20 points lower than last office visit.  He still admits to having "white coat syndrome" with elevated blood pressures during provider visits.  Past Medical History:  Diagnosis Date   Anginal pain ([place])    denied chest pain 11/28/18   CAD (  coronary artery disease)    a. 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   Squamous cell carcinoma of scalp    "some burned, some cut off" (11/17/2016)   Thyroid nodule, hot    s/p radioactive iodine, now hypothyroidism    Past Surgical History:  Procedure Laterality Date   BYPASS GRAFT POPLITEAL TO POPLITEAL Right 11/30/2018   Procedure: RIGHT POPLITEAL ARTERY ANEURYSM REPAIR WITH ABOVE KNEE POPLITEAL TO BELOW KNEE  POPLITEAL BY PASS GRAFT USING [place];  Surgeon: Rosetta Posner, MD;  Location: [place] [facility];  Service: Vascular;  Laterality: Right;   CARDIAC CATHETERIZATION  09/2014   "day before they put the stent in"   [place]  03/2001; 09/2014; 11/17/2016   "1+1+1"   CORONARY STENT INTERVENTION N/A 11/17/2016   Procedure: Coronary Stent Intervention;  Surgeon: Lorretta Harp, MD;  Location: [place] [facility];  Service: Cardiovascular;  Laterality: N/A;   INTRAVASCULAR PRESSURE WIRE/FFR STUDY N/A 11/17/2016   Procedure: Intravascular Pressure Wire/FFR Study;  Surgeon: Lorretta Harp, MD;  Location: [place] [facility];  Service: Cardiovascular;  Laterality: N/A;   LEFT HEART CATH AND CORONARY ANGIOGRAPHY N/A 11/17/2016   Procedure: Left Heart Cath and Coronary Angiography;  Surgeon: Lorretta Harp, MD;  Location: [place] [facility];  Service: Cardiovascular;  Laterality: N/A;   LEFT HEART CATHETERIZATION WITH CORONARY ANGIOGRAM N/A 09/30/2014   Procedure: LEFT HEART CATHETERIZATION WITH CORONARY ANGIOGRAM;  Surgeon: Troy Sine, MD;  Location: [facility] C[facility] LAB;  Service: Cardiovascular;  Laterality: N/A;   PERCUTANEOUS CORONARY STENT INTERVENTION (PCI-S) N/A 10/02/2014   Procedure: PERCUTANEOUS CORONARY STENT INTERVENTION (PCI-S);  Surgeon: Burnell Blanks, MD;  Location: [facility] CATH LAB;  Service: Cardiovascular;  Laterality: N/A;   SQUAMOUS CELL CARCINOMA EXCISION     "scalp"   WISDOM TOOTH EXTRACTION       Current Outpatient Medications  Medication Sig Dispense Refill   ascorbic acid (VITAMIN C) 1000 MG tablet Take 1,000 mg by mouth daily.     aspirin 81 MG tablet Take 81 mg by mouth every evening.      chlorthalidone (HYGROTON) 25 MG tablet Take 1 tablet (25 mg total) by mouth daily. 30 tablet 6   Cholecalciferol (VITAMIN D) 50 MCG (2000 UT) tablet Take 4,000 Units by mouth daily.     clopidogrel (PLAVIX) 75 MG tablet Take 1 tablet (75 mg total) by mouth  daily. 90 tablet 3   fenofibrate 160 MG tablet Take 160 mg by mouth daily.     furosemide (LASIX) 20 MG tablet Take 20 mg by mouth daily as needed for fluid.      gabapentin (NEURONTIN) 300 MG capsule Take 1 capsule (300 mg total) by mouth daily. (Patient taking differently: Take 300 mg by mouth daily as needed (pain). ) 30 capsule 0   levothyroxine (SYNTHROID, LEVOTHROID) 112 MCG tablet Take 112 mcg by mouth daily.     magnesium oxide (MAG-OX) 400 MG tablet Take 400 mg by mouth daily.     metoprolol succinate (TOPROL-XL) 50 MG 24 hr tablet Take 3 tablets (150 mg total) by mouth daily. 270 tablet 3   nitroGLYCERIN (NITROSTAT) 0.4 MG SL tablet Place 1 tablet (0.4 mg total) under the tongue every 5 (five) minutes as needed for chest pain. 25 tablet 2   omega-3 acid ethyl  esters (LOVAZA) 1 g capsule Take 1 g by mouth daily.      Omeprazole-Sodium Bicarbonate (ZEGERID OTC) 20-1100 MG CAPS capsule Take 1 capsule by mouth daily as needed (acid reflux).     ramipril (ALTACE) 10 MG capsule Take 10 mg by mouth every evening.      No current facility-administered medications for this visit.    Allergies:   Patient has no known allergies.    Social History:  The patient  reports that he quit smoking about 18 years ago. His smoking use included cigarettes. He has a 40.00 pack-year smoking history. He has never used smokeless tobacco. He reports current alcohol use. He reports that he does not use drugs.   Family History:  The patient's family history is not on file.    ROS: All other systems are reviewed and negative. Unless otherwise mentioned in H&P    PHYSICAL EXAM: VS:  BP (!) 144/81   Pulse (!) 59   Ht 5\' 8"  (1.727 m)   Wt 220 lb (99.8 kg)   SpO2 97%   BMI 33.45 kg/m  , BMI Body mass index is 33.45 kg/m. GEN: Well nourished, well developed, in no acute distress HEENT: normal Neck: no JVD, carotid bruits, or masses Cardiac: RRR; no murmurs, rubs, or gallops,no edema  Respiratory:   Clear to auscultation bilaterally, normal work of breathing GI: soft, nontender, nondistended, + BS MS: no deformity or atrophy Skin: warm and dry, no rash Neuro:  Strength and sensation are intact Psych: euthymic mood, full affect   EKG: Not completed today's visit \  Recent Labs: 11/28/2018: ALT 17 12/01/2018: BUN 15; Creatinine, Ser 1.19; Hemoglobin 12.6; Platelets 204; Potassium 4.0; Sodium 140    Lipid Panel    Component Value Date/Time   CHOL 100 10/01/2014 0303   TRIG 338 (H) 10/01/2014 0303   HDL 21 (L) 10/01/2014 0303   CHOLHDL 4.8 10/01/2014 0303   VLDL 68 (H) 10/01/2014 0303   LDLCALC 11 10/01/2014 0303      Wt Readings from Last 3 Encounters:  05/30/19 220 lb (99.8 kg)  03/27/19 220 lb (99.8 kg)  03/08/19 225 lb (102.1 kg)      Other studies Reviewed: LHC 11/17/2016  Ost RCA to Prox RCA lesion, 40 %stenosed. Prox Cx to Mid Cx lesion, 0 %stenosed. Mid Cx lesion, 70 %stenosed. Post intervention, there is a 0% residual stenosis. A stent was successfully placed. The left ventricular systolic function is normal. LV end diastolic pressure is normal. The left ventricular ejection fraction is 55-65% by visual estimate.  ASSESSMENT AND PLAN: 1.  Hypertension: Much better controlled with addition of chlorthalidone 25 mg daily.  He has had repeat labs completed by his PCP dated 05/10/2019.  I have had these results scanned in the computer.  Briefly his sodium is 141 potassium 4.4 creatinine 1.35.  He is without complaints of dizziness, muscle cramping, or thirst with the addition of the chlorthalidone.  I will not make any medication changes at this time.  He will continue his antihypertensive medications which also include ramipril 10 mg daily and metoprolol 150 mg daily.  2.  CAD: Stent to the RCA and circumflex, with repeat catheterization in 2018 which revealed a high-grade mid circumflex lesion at the bend which was also stented with a Synergy drug-eluting stent.   He remains on aspirin and Plavix.  He did run out briefly for 3 days and did have some anginal symptoms.  This did require dose of nitroglycerin.  Since starting back on the Plavix he has not had any recurrent symptoms.  He has 90-day supply of Plavix with plenty of refills.  Continue current medication regimen.  3.  Mixed hyperlipidemia: He has been started on pravastatin 20 mg daily in addition to fenofibrate 160 mg daily.  Review of his labs which she brings with him today, from 05/10/2019, cholesterol 163, triglycerides 252, HDL 25, LDL 95.  We will recheck fasting lipids and LFTs in 3 months.  He will follow with Dr. Alvester Chou thereafter.  4.  Hypothyroidism: Continue on levothyroxine 112 mcg daily.  To be followed by PCP.  5.  PAD: Status post above-the-knee bypass of the right popliteal artery aneurysm.  Completed on 11/30/2018.  He offers no further complaints of discomfort in his legs with the exception of some mild edema noted in the right leg compared to the left.    Current medicines are reviewed at length with the patient today.    Labs/ tests ordered today include: BMET. Lipids andLFTs Phill Myron. [place] Pugh, ANP, AACC   05/30/2019 9:18 AM    [address] Office [phone] Fax [phone]  Notice: This dictation was prepared with Dragon dictation along with smaller phrase technology. Any transcriptional errors that result from this process are unintentional and may not be corrected upon review.

## 2019-05-30 ENCOUNTER — Encounter: Payer: Self-pay | Admitting: Adult Health

## 2019-05-30 ENCOUNTER — Ambulatory Visit: Payer: Medicare HMO | Admitting: Adult Health

## 2019-05-30 ENCOUNTER — Other Ambulatory Visit: Payer: Self-pay

## 2019-05-30 VITALS — BP 144/81 | HR 59 | Ht 68.0 in | Wt 220.0 lb

## 2019-05-30 DIAGNOSIS — I2510 Atherosclerotic heart disease of native coronary artery without angina pectoris: Secondary | ICD-10-CM

## 2019-05-30 DIAGNOSIS — I724 Aneurysm of artery of lower extremity: Secondary | ICD-10-CM

## 2019-05-30 DIAGNOSIS — E785 Hyperlipidemia, unspecified: Secondary | ICD-10-CM | POA: Diagnosis not present

## 2019-05-30 DIAGNOSIS — E782 Mixed hyperlipidemia: Secondary | ICD-10-CM | POA: Diagnosis not present

## 2019-05-30 DIAGNOSIS — Z9861 Coronary angioplasty status: Secondary | ICD-10-CM | POA: Diagnosis not present

## 2019-05-30 DIAGNOSIS — Z79899 Other long term (current) drug therapy: Secondary | ICD-10-CM | POA: Diagnosis not present

## 2019-05-30 DIAGNOSIS — I10 Essential (primary) hypertension: Secondary | ICD-10-CM

## 2019-05-30 MED ORDER — METOPROLOL SUCCINATE ER 50 MG PO TB24: 150.0000 mg | ORAL_TABLET | Freq: Every day | ORAL | 3 refills | Status: DC

## 2019-05-30 MED ORDER — CLOPIDOGREL BISULFATE 75 MG PO TABS: 75.0000 mg | ORAL_TABLET | Freq: Every day | ORAL | 3 refills | Status: DC

## 2019-05-30 NOTE — Patient Instructions (Signed)
Medication Instructions:  Continue current medications  If you need a refill on your cardiac medications before your next appointment, please call your pharmacy.  Labwork: Fasting Lipid and Liver in 3 Months HERE IN OUR OFFICE AT [facility]   You will need to fast. DO NOT EAT OR DRINK PAST MIDNIGHT.      If you have labs (blood work) drawn today and your tests are completely normal, you will receive your results only by: Marland Kitchen MyChart Message (if you have MyChart) OR . A paper copy in the mail If you have any lab test that is abnormal or we need to change your treatment, we will call you to review the results.  Testing/Procedures: None Ordered  PLEASE READ AND FOLLOW SALTY 6 ATTACHED  Reduce your risk of getting COVID-19 With your heart disease it is especially important for people at increased risk of severe illness from COVID-19, and those who live with them, to protect themselves from getting COVID-19. The best way to protect yourself and to help reduce the spread of the virus that causes COVID-19 is to: Marland Kitchen Limit your interactions with other people as much as possible. . Take precautions to prevent getting COVID-19 when you do interact with others. If you start feeling sick and think you may have COVID-19, get in touch with your healthcare provider within 24 hours.  Follow-Up: IN 4 months Please call our office 2 months in advance, to schedule this appointment. In Person [name], MD.    At [facility], you and your health needs are our priority.  As part of our continuing mission to provide you with exceptional heart care, we have created designated Provider Care Teams.  These Care Teams include your primary Cardiologist (physician) and Advanced Practice Providers (APPs -  Physician Assistants and Nurse Practitioners) who all work together to provide you with the care you need, when you need it.  Thank you for choosing [facility] at [facility]!!     Happy  Holidays!!

## 2019-06-15 ENCOUNTER — Other Ambulatory Visit: Payer: Self-pay | Admitting: Cardiovascular Disease

## 2019-06-25 IMAGING — DX DG CHEST 2V
2 series · 2 of 2 positions shown · non-contrast
Comparison: 11/07/2016 and earlier.

CLINICAL DATA: 72-year-old male with chest pain and pressure.

EXAM:
CHEST - 2 VIEW

[chest pa]
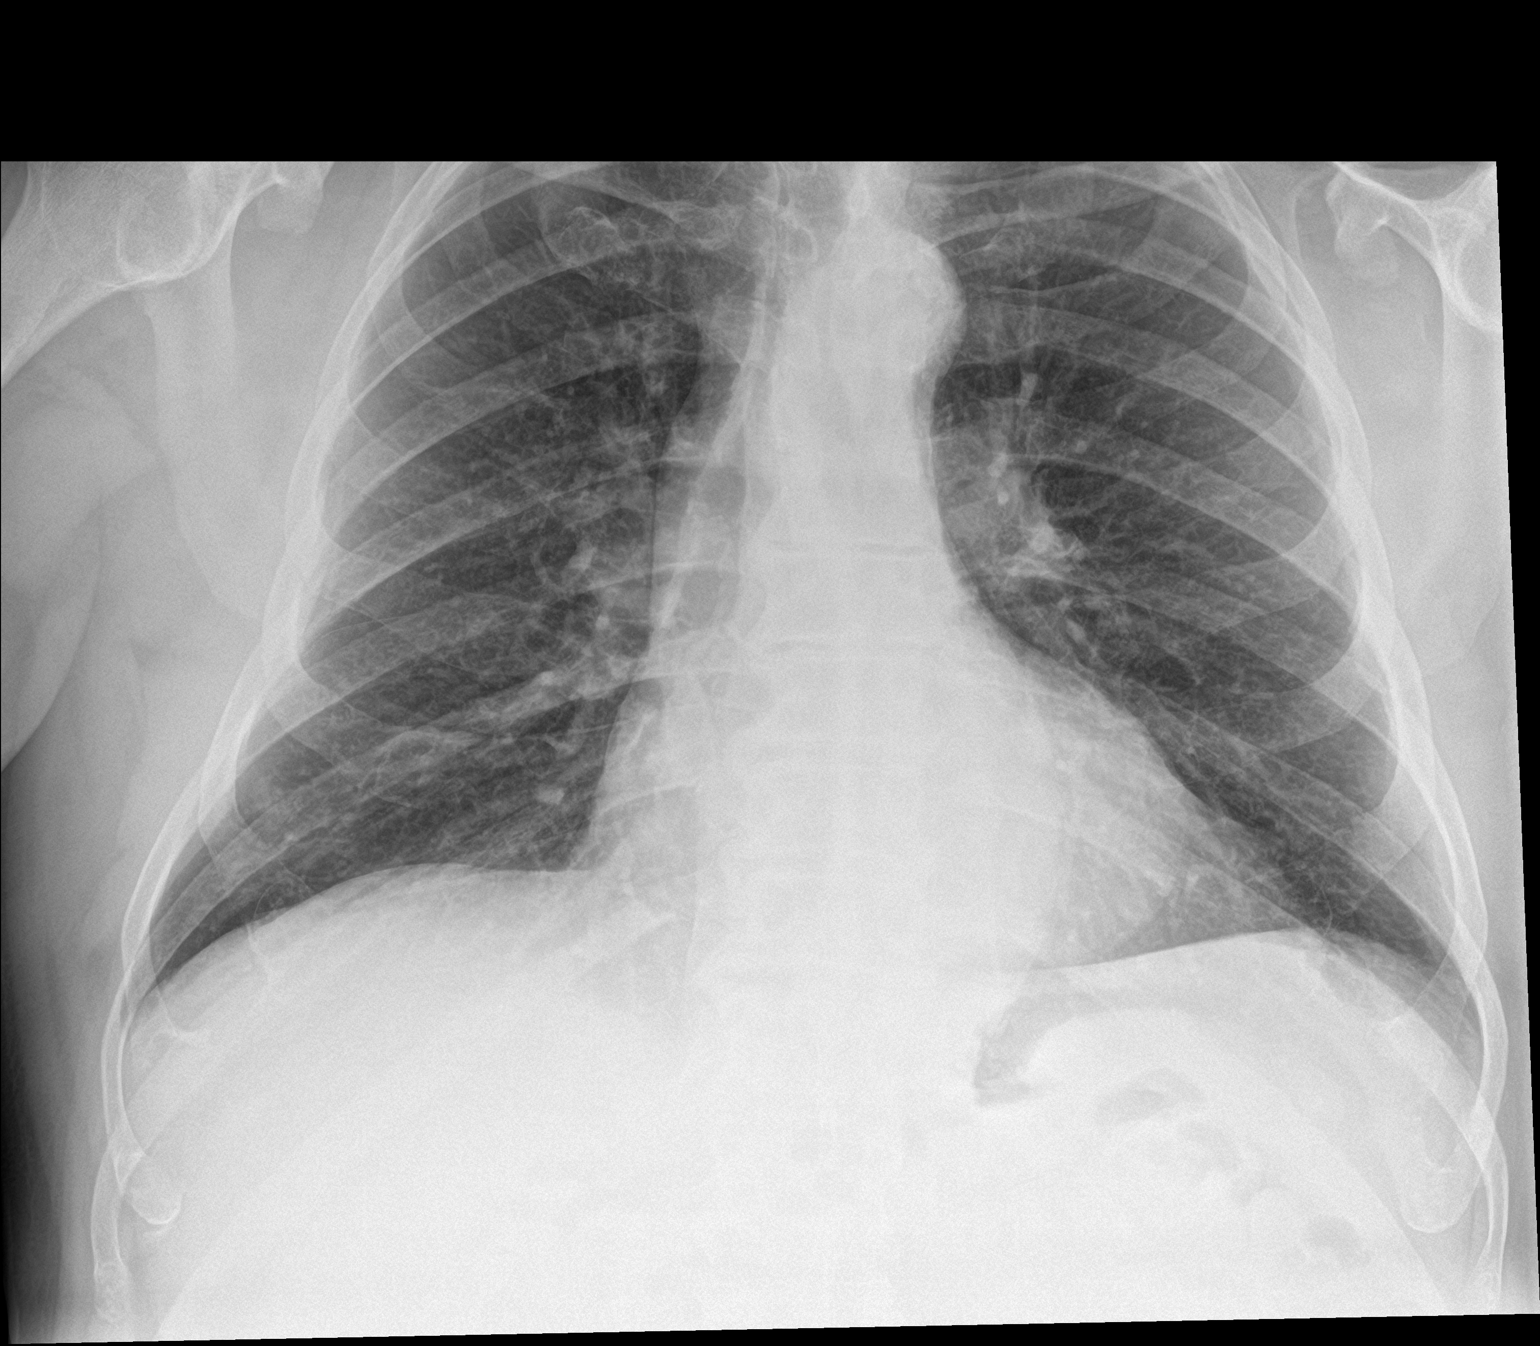

[chest lat]
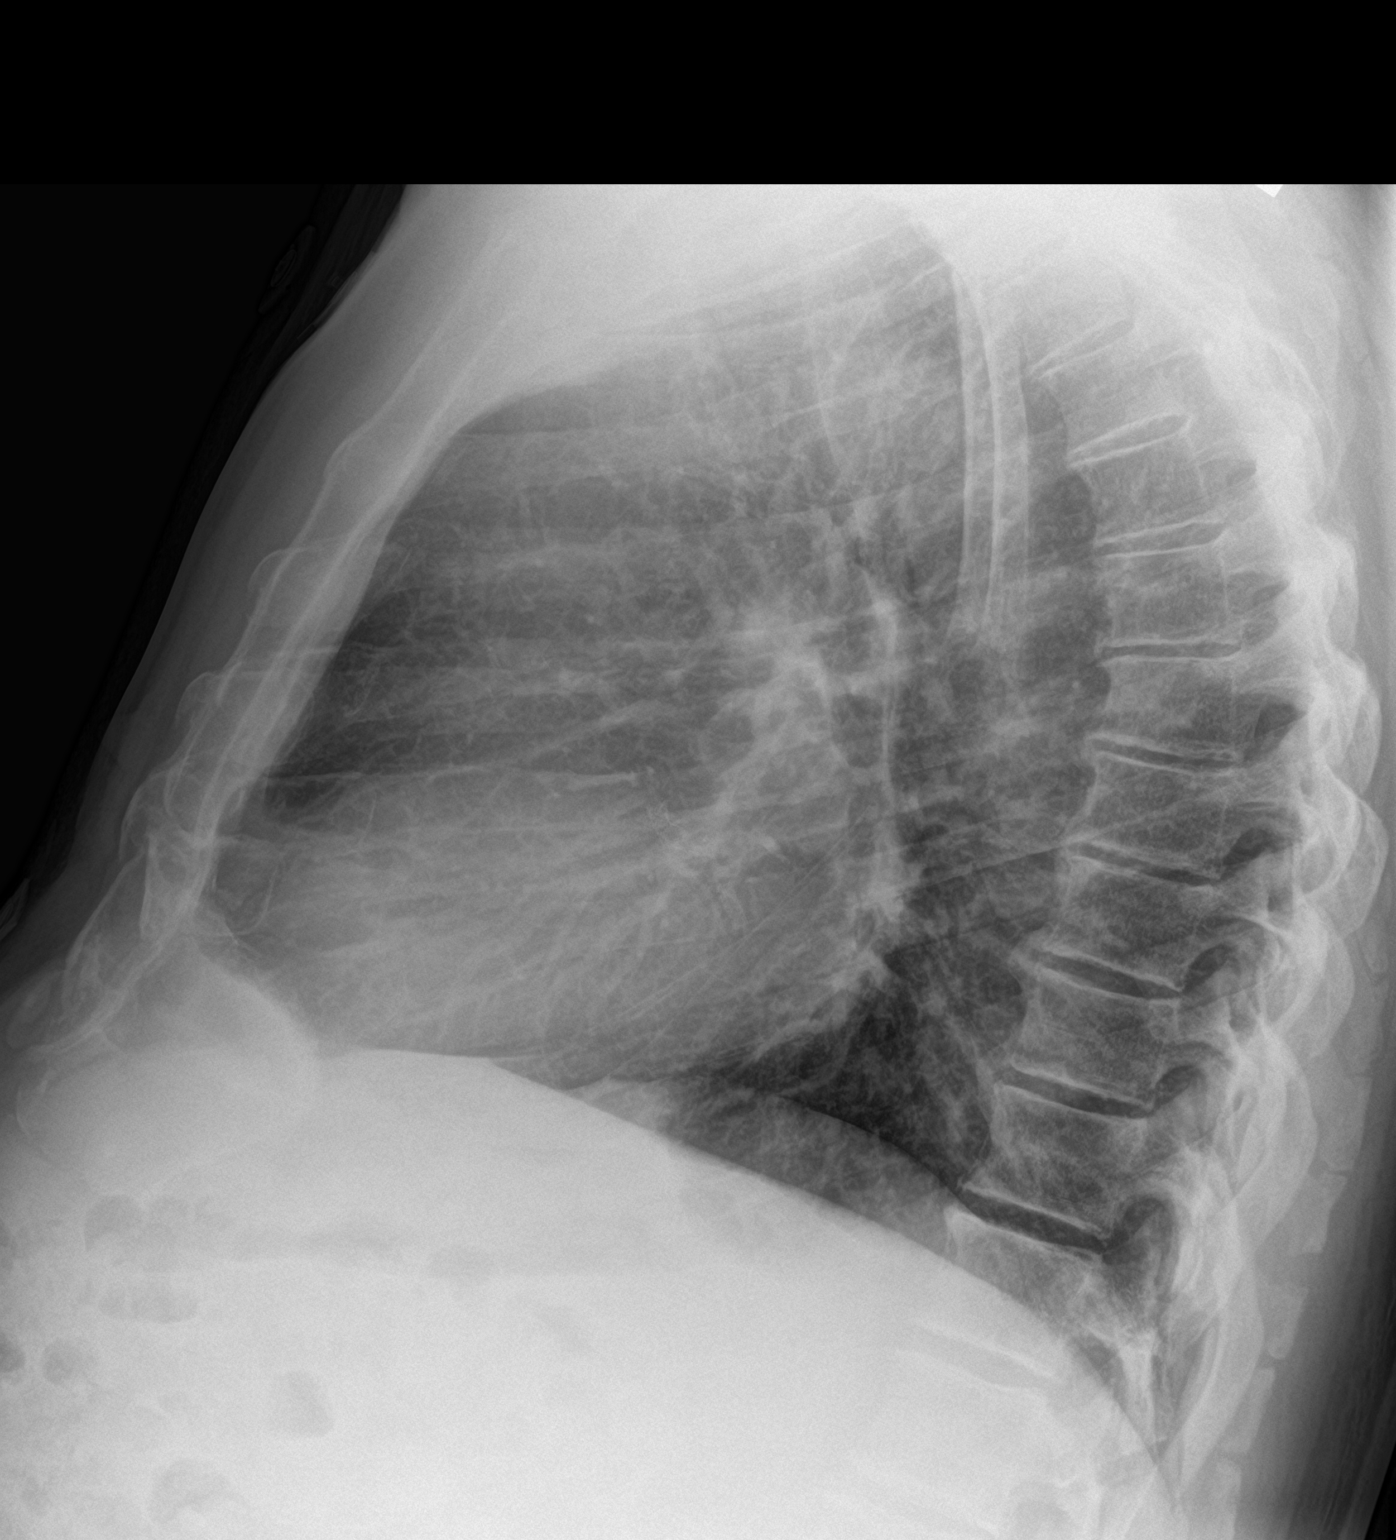

[2 of 2 positions shown; findings below may reference images not displayed]

FINDINGS: Stable cardiac size at the upper limits of normal. Other mediastinal
contours are within normal limits. Visualized tracheal air column is
within normal limits. Both lungs are clear. No pneumothorax or
pleural effusion. No acute osseous abnormality identified. Negative
visible bowel gas pattern.
IMPRESSION: No acute cardiopulmonary abnormality.

## 2019-08-05 DIAGNOSIS — L821 Other seborrheic keratosis: Secondary | ICD-10-CM | POA: Diagnosis not present

## 2019-08-05 DIAGNOSIS — L570 Actinic keratosis: Secondary | ICD-10-CM | POA: Diagnosis not present

## 2019-08-05 DIAGNOSIS — Z85828 Personal history of other malignant neoplasm of skin: Secondary | ICD-10-CM | POA: Diagnosis not present

## 2019-08-12 DIAGNOSIS — R69 Illness, unspecified: Secondary | ICD-10-CM | POA: Diagnosis not present

## 2019-09-03 DIAGNOSIS — E785 Hyperlipidemia, unspecified: Secondary | ICD-10-CM | POA: Diagnosis not present

## 2019-09-03 DIAGNOSIS — I2510 Atherosclerotic heart disease of native coronary artery without angina pectoris: Secondary | ICD-10-CM | POA: Diagnosis not present

## 2019-09-03 DIAGNOSIS — Z79899 Other long term (current) drug therapy: Secondary | ICD-10-CM | POA: Diagnosis not present

## 2019-09-03 DIAGNOSIS — Z9861 Coronary angioplasty status: Secondary | ICD-10-CM | POA: Diagnosis not present

## 2019-09-03 LAB — HEPATIC FUNCTION PANEL
ALT: 13 IU/L (ref 0–44)
AST: 17 IU/L (ref 0–40)
Albumin: 4.2 g/dL (ref 3.7–4.7)
Alkaline Phosphatase: 46 IU/L (ref 39–117)
Bilirubin Total: 0.3 mg/dL (ref 0.0–1.2)
Bilirubin, Direct: 0.13 mg/dL (ref 0.00–0.40)
Total Protein: 7 g/dL (ref 6.0–8.5)

## 2019-09-03 LAB — LIPID PANEL
Chol/HDL Ratio: 6.2 ratio — ABNORMAL HIGH (ref 0.0–5.0)
Cholesterol, Total: 148 mg/dL (ref 100–199)
HDL: 24 mg/dL — ABNORMAL LOW (ref >39)
LDL Chol Calc (NIH): 71 mg/dL (ref 0–99)
Triglycerides: 330 mg/dL — ABNORMAL HIGH (ref 0–149)
VLDL Cholesterol Cal: 53 mg/dL — ABNORMAL HIGH (ref 5–40)

## 2019-09-13 ENCOUNTER — Encounter: Payer: Self-pay | Admitting: Cardiovascular Disease

## 2019-09-13 ENCOUNTER — Other Ambulatory Visit: Payer: Self-pay

## 2019-09-13 ENCOUNTER — Ambulatory Visit: Payer: Medicare HMO | Admitting: Cardiovascular Disease

## 2019-09-13 VITALS — BP 140/78 | HR 63 | Ht 68.0 in | Wt 216.6 lb

## 2019-09-13 DIAGNOSIS — Z9861 Coronary angioplasty status: Secondary | ICD-10-CM | POA: Diagnosis not present

## 2019-09-13 DIAGNOSIS — I2510 Atherosclerotic heart disease of native coronary artery without angina pectoris: Secondary | ICD-10-CM

## 2019-09-13 DIAGNOSIS — E785 Hyperlipidemia, unspecified: Secondary | ICD-10-CM | POA: Diagnosis not present

## 2019-09-13 DIAGNOSIS — I10 Essential (primary) hypertension: Secondary | ICD-10-CM

## 2019-09-13 DIAGNOSIS — I4510 Unspecified right bundle-branch block: Secondary | ICD-10-CM

## 2019-09-13 NOTE — Assessment & Plan Note (Signed)
History of dyslipidemia and hypertriglyceridemia on fenofibrate, and Lovaza with lipid profile performed 09/03/2019 revealing total cholesterol 148, LDL 71, HDL 24 and triglyceride level of 330.  Will refer him to Dr. Debara Pickett for further evaluation.

## 2019-09-13 NOTE — Assessment & Plan Note (Signed)
History of CAD status post RCA and circumflex stenting by Dr. Glade Lloyd in 2002.  He was catheterized by Dr. Claiborne Billings 09/30/2014 and high-grade mid circumflex lesion which underwent stenting by Dr. Angelena Form 2 days later with a Promus Premier drug-eluting stent postdilated to 4.5 mm.  I catheterized him radially 11/17/2016 revealing a widely patent circumflex stent, widely patent RCA with a 70% lesion in the AV groove circumflex beyond a previously stented segment.  I performed FFR which was 0.7 suggesting this was significant and I restented this with a 3 mm x 12 mm long Synergy drug-eluting stent post dilating up to 3.25 mm.  He has done well since, denies chest pain or shortness of breath.

## 2019-09-13 NOTE — Assessment & Plan Note (Signed)
History of essential hypertension with blood pressure measured today 140/78.  Blood pressures at home are significantly better than this.  He is on chlorthalidone and metoprolol as well as ramipril.

## 2019-09-13 NOTE — Assessment & Plan Note (Signed)
Chronic. 

## 2019-09-13 NOTE — Progress Notes (Signed)
09/13/2019 Dennis [name]   [birth year]  [id number]  Primary Physician Merrilee Seashore, MD Primary Cardiologist: Lorretta Harp MD [name], [place]  HPI:  Dennis [name] is a 74 y.o.    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 He is a patient of Dr.Walter Pharr's. I last saw him in the office  03/08/2019.Marland Kitchen History of hyperlipidemia and CAD status post RCA and circumflex intervention by Dr. Glade Lloyd back in 2002. He was admitted to [facility] 09/30/14 with unstable angina. He underwent cardiac catheterization that day by Dr. Claiborne Billings revealing a high-grade mid circumflex lesion on a bend and was stented 2 days later by Dr. Julianne Handler with a Promus premiere drug eluting stent postdilated with a 4.5 mm balloon.When I saw him 3 months ago he had experienced several episodes of nitrate responsive chest pain. I ultimately decided to perform outpatient cardiac catheterization. The right radial approach on 11/17/16 revealing a widely patent proximal circumflex stent, widely patent RCA with 70% lesion in the AV groove circumflex beyond the previously stented segment. I performed FFR which was 0.7 suggesting this was physiologically significant and stented that with a 3 mm x 12 mm long synergy drug-eluting stent post dilating up to 3.25 mm. This pain has since Since I saw himin the office in January this year he did develop pain in his right leg with some swelling.  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  He ultimately had right above to below the knee bypass grafting by Dr. Sherren Mocha Early 11/30/2018 which he has still slowly healing from.   Since I saw him 6 months ago he continues to do well.  He denies chest pain or shortness of breath.  His most recent lipid profile looked excellent except for hypertriglyceridemia of 330 on fenofibrate and Lovaza.  We will refer him to  Dr. Debara Pickett for further evaluation and treatment of this.      Current Meds  Medication Sig  . ascorbic acid (VITAMIN C) 1000 MG tablet Take 1,000 mg by mouth daily.  Marland Kitchen aspirin 81 MG tablet Take 81 mg by mouth every evening.   . chlorthalidone (HYGROTON) 25 MG tablet Take 1 tablet (25 mg total) by mouth daily.  . Cholecalciferol (VITAMIN D) 50 MCG (2000 UT) tablet Take 4,000 Units by mouth daily.  . clopidogrel (PLAVIX) 75 MG tablet Take 1 tablet (75 mg total) by mouth daily.  . fenofibrate 160 MG tablet Take 160 mg by mouth daily.  . furosemide (LASIX) 20 MG tablet Take 20 mg by mouth daily as needed for fluid.   Marland Kitchen gabapentin (NEURONTIN) 300 MG capsule Take 1 capsule (300 mg total) by mouth daily. (Patient taking differently: Take 300 mg by mouth daily as needed (pain). )  . levothyroxine (SYNTHROID, LEVOTHROID) 112 MCG tablet Take 112 mcg by mouth daily.  . magnesium oxide (MAG-OX) 400 MG tablet Take 400 mg by mouth daily.  . metoprolol succinate (TOPROL-XL) 50 MG 24 hr tablet Take 3 tablets (150 mg total) by mouth daily.  . nitroGLYCERIN (NITROSTAT) 0.4 MG SL tablet PLACE 1 TABLET UNDER THE TONGUE EVERY 5 MINUTES AS NEEDED FOR CHEST PAIN  . omega-3 acid ethyl esters (LOVAZA) 1 g capsule Take 1 g by mouth daily.   [place] Bicarbonate (ZEGERID OTC) 20-1100 MG CAPS capsule Take 1 capsule by mouth daily as needed (acid reflux).  . ramipril (ALTACE)  10 MG capsule Take 10 mg by mouth every evening.      No Known Allergies  Social History   Socioeconomic History  . Marital status: Married    Spouse name: Not on file  . Number of children: Not on file  . Years of education: Not on file  . Highest education level: Not on file  Occupational History  . Not on file  Tobacco Use  . Smoking status: Former Smoker    Packs/day: 1.00    Years: 40.00    Pack years: 40.00    Types: Cigarettes    Quit date: 03/06/2001    Years since quitting: 18.5  . Smokeless tobacco: Never  Used  Substance and Sexual Activity  . Alcohol use: Yes    Comment: occ  . Drug use: No  . Sexual activity: Not on file  Other Topics Concern  . Not on file  Social History Narrative  . Not on file   Social Determinants of Health   Financial Resource Strain:   . Difficulty of Paying Living Expenses:   Food Insecurity:   . Worried About Charity fundraiser in the Last Year:   . Arboriculturist in the Last Year:   Transportation Needs:   . Film/video editor (Medical):   Marland Kitchen Lack of Transportation (Non-Medical):   Physical Activity:   . Days of Exercise per Week:   . Minutes of Exercise per Session:   Stress:   . Feeling of Stress :   Social Connections:   . Frequency of Communication with Friends and Family:   . Frequency of Social Gatherings with Friends and Family:   . Attends Religious Services:   . Active Member of Clubs or Organizations:   . Attends Archivist Meetings:   Marland Kitchen Marital Status:   Intimate Partner Violence:   . Fear of Current or Ex-Partner:   . Emotionally Abused:   Marland Kitchen Physically Abused:   . Sexually Abused:      Review of Systems: General: negative for chills, fever, night sweats or weight changes.  Cardiovascular: negative for chest pain, dyspnea on exertion, edema, orthopnea, palpitations, paroxysmal nocturnal dyspnea or shortness of breath Dermatological: negative for rash Respiratory: negative for cough or wheezing Urologic: negative for hematuria Abdominal: negative for nausea, vomiting, diarrhea, bright red blood per rectum, melena, or hematemesis Neurologic: negative for visual changes, syncope, or dizziness All other systems reviewed and are otherwise negative except as noted above.    Blood pressure 140/78, pulse 63, height 5\' 8"  (1.727 m), weight 216 lb 9.6 oz (98.2 kg).  General appearance: alert and no distress Neck: no adenopathy, no carotid bruit, no JVD, supple, symmetrical, trachea midline and thyroid not enlarged,  symmetric, no tenderness/mass/nodules Lungs: clear to auscultation bilaterally Heart: regular rate and rhythm, S1, S2 normal, no murmur, click, rub or gallop Extremities: extremities normal, atraumatic, no cyanosis or edema Pulses: 2+ and symmetric Skin: Skin color, texture, turgor normal. No rashes or lesions Neurologic: Alert and oriented X 3, normal strength and tone. Normal symmetric reflexes. Normal coordination and gait  EKG rhythm at 63 with right bundle branch block.  I personally reviewed this EKG.  ASSESSMENT AND PLAN:   Dyslipidemia History of dyslipidemia and hypertriglyceridemia on fenofibrate, and Lovaza with lipid profile performed 09/03/2019 revealing total cholesterol 148, LDL 71, HDL 24 and triglyceride level of 330.  Will refer him to Dr. Debara Pickett for further evaluation.  CAD S/P multiple PCIs History of CAD status  post RCA and circumflex stenting by Dr. Glade Lloyd in 2002.  He was catheterized by Dr. Claiborne Billings 09/30/2014 and high-grade mid circumflex lesion which underwent stenting by Dr. Angelena Form 2 days later with a Promus Premier drug-eluting stent postdilated to 4.5 mm.  I catheterized him radially 11/17/2016 revealing a widely patent circumflex stent, widely patent RCA with a 70% lesion in the AV groove circumflex beyond a previously stented segment.  I performed FFR which was 0.7 suggesting this was significant and I restented this with a 3 mm x 12 mm long Synergy drug-eluting stent post dilating up to 3.25 mm.  He has done well since, denies chest pain or shortness of breath.  Essential hypertension History of essential hypertension with blood pressure measured today 140/78.  Blood pressures at home are significantly better than this.  He is on chlorthalidone and metoprolol as well as ramipril.  RBBB Chronic      Lorretta Harp MD FACP,FACC,FAHA, [facility] 09/13/2019 10:03 AM

## 2019-09-13 NOTE — Patient Instructions (Signed)
Medication Instructions:  Your physician recommends that you continue on your current medications as directed. Please refer to the Current Medication list given to you today.  *If you need a refill on your cardiac medications before your next appointment, please call your pharmacy*  Lab Work: NONE  Testing/Procedures: NONE  Follow-Up: At [facility], you and your health needs are our priority.  As part of our continuing mission to provide you with exceptional heart care, we have created designated Provider Care Teams.  These Care Teams include your primary Cardiologist (physician) and Advanced Practice Providers (APPs -  Physician Assistants and Nurse Practitioners) who all work together to provide you with the care you need, when you need it.  We recommend signing up for the patient portal called "MyChart".  Sign up information is provided on this After Visit Summary.  MyChart is used to connect with patients for Virtual Visits (Telemedicine).  Patients are able to view lab/test results, encounter notes, upcoming appointments, etc.  Non-urgent messages can be sent to your provider as well.   To learn more about what you can do with MyChart, go to [email or web address].    Your next appointment:   12 month(s)  The format for your next appointment:   In Person  Provider:   [name], MD   Other Instructions Schedule appointment with Dr. [facility]
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
## 2019-09-24 ENCOUNTER — Telehealth (INDEPENDENT_AMBULATORY_CARE_PROVIDER_SITE_OTHER): Payer: Medicare HMO | Admitting: Adult Health

## 2019-09-24 ENCOUNTER — Encounter: Payer: Self-pay | Admitting: Adult Health

## 2019-09-24 VITALS — BP 167/83 | HR 60 | Ht 68.0 in | Wt 213.0 lb

## 2019-09-24 DIAGNOSIS — I10 Essential (primary) hypertension: Secondary | ICD-10-CM

## 2019-09-24 DIAGNOSIS — E781 Pure hyperglyceridemia: Secondary | ICD-10-CM | POA: Diagnosis not present

## 2019-09-24 DIAGNOSIS — E782 Mixed hyperlipidemia: Secondary | ICD-10-CM | POA: Diagnosis not present

## 2019-09-24 NOTE — Patient Instructions (Signed)
Medication Instructions:  Continue current medications  *If you need a refill on your cardiac medications before your next appointment, please call your pharmacy*   Lab Work: Fasting Lipid and Liver  If you have labs (blood work) drawn today and your tests are completely normal, you will receive your results only by: Marland Kitchen MyChart Message (if you have MyChart) OR . A paper copy in the mail If you have any lab test that is abnormal or we need to change your treatment, we will call you to review the results.   Testing/Procedures: None Ordered   Follow-Up: At [facility], you and your health needs are our priority.  As part of our continuing mission to provide you with exceptional heart care, we have created designated Provider Care Teams.  These Care Teams include your primary Cardiologist (physician) and Advanced Practice Providers (APPs -  Physician Assistants and Nurse Practitioners) who all work together to provide you with the care you need, when you need it.  We recommend signing up for the patient portal called "MyChart".  Sign up information is provided on this After Visit Summary.  MyChart is used to connect with patients for Virtual Visits (Telemedicine).  Patients are able to view lab/test results, encounter notes, upcoming appointments, etc.  Non-urgent messages can be sent to your provider as well.   To learn more about what you can do with MyChart, go to [email or web address].    Your next appointment:   3 month(s)  The format for your next appointment:   In Person  Provider:   You may see [name], MD or one of the following Advanced Practice Providers on your designated Care Team:    Kerin Ransom, PA-C  [place], [place]  Coletta Memos, [place]

## 2019-11-15 DIAGNOSIS — I2510 Atherosclerotic heart disease of native coronary artery without angina pectoris: Secondary | ICD-10-CM | POA: Diagnosis not present

## 2019-11-15 DIAGNOSIS — I129 Hypertensive chronic kidney disease with stage 1 through stage 4 chronic kidney disease, or unspecified chronic kidney disease: Secondary | ICD-10-CM | POA: Diagnosis not present

## 2019-11-15 DIAGNOSIS — N182 Chronic kidney disease, stage 2 (mild): Secondary | ICD-10-CM | POA: Diagnosis not present

## 2019-11-15 DIAGNOSIS — R7309 Other abnormal glucose: Secondary | ICD-10-CM | POA: Diagnosis not present

## 2019-11-15 DIAGNOSIS — E785 Hyperlipidemia, unspecified: Secondary | ICD-10-CM | POA: Diagnosis not present

## 2019-11-19 ENCOUNTER — Other Ambulatory Visit: Payer: Self-pay | Admitting: *Deleted

## 2019-11-19 DIAGNOSIS — I724 Aneurysm of artery of lower extremity: Secondary | ICD-10-CM

## 2019-11-25 DIAGNOSIS — I209 Angina pectoris, unspecified: Secondary | ICD-10-CM | POA: Diagnosis not present

## 2019-11-25 DIAGNOSIS — R7309 Other abnormal glucose: Secondary | ICD-10-CM | POA: Diagnosis not present

## 2019-11-25 DIAGNOSIS — I129 Hypertensive chronic kidney disease with stage 1 through stage 4 chronic kidney disease, or unspecified chronic kidney disease: Secondary | ICD-10-CM | POA: Diagnosis not present

## 2019-11-25 DIAGNOSIS — I2510 Atherosclerotic heart disease of native coronary artery without angina pectoris: Secondary | ICD-10-CM | POA: Diagnosis not present

## 2019-11-25 DIAGNOSIS — E785 Hyperlipidemia, unspecified: Secondary | ICD-10-CM | POA: Diagnosis not present

## 2019-11-25 DIAGNOSIS — N1831 Chronic kidney disease, stage 3a: Secondary | ICD-10-CM | POA: Diagnosis not present

## 2019-11-25 DIAGNOSIS — I724 Aneurysm of artery of lower extremity: Secondary | ICD-10-CM | POA: Diagnosis not present

## 2019-11-25 DIAGNOSIS — Z0000 Encounter for general adult medical examination without abnormal findings: Secondary | ICD-10-CM | POA: Diagnosis not present

## 2019-11-26 ENCOUNTER — Ambulatory Visit (HOSPITAL_COMMUNITY)
Admission: RE | Admit: 2019-11-26 | Discharge: 2019-11-26 | Disposition: A | Discharge status: Home / Self Care | Payer: Medicare HMO | Source: Ambulatory Visit | Attending: Vascular Surgery | Admitting: Vascular Surgery

## 2019-11-26 ENCOUNTER — Other Ambulatory Visit: Payer: Self-pay | Admitting: Vascular Surgery

## 2019-11-26 ENCOUNTER — Other Ambulatory Visit: Payer: Self-pay

## 2019-11-26 ENCOUNTER — Ambulatory Visit: Payer: Medicare HMO | Admitting: Vascular Surgery

## 2019-11-26 ENCOUNTER — Encounter: Payer: Self-pay | Admitting: Vascular Surgery

## 2019-11-26 VITALS — BP 150/90 | HR 59 | Temp 97.8°F | Resp 18 | Ht 67.0 in | Wt 218.3 lb

## 2019-11-26 DIAGNOSIS — I724 Aneurysm of artery of lower extremity: Secondary | ICD-10-CM
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
## 2019-12-19 ENCOUNTER — Telehealth: Payer: Self-pay | Admitting: *Deleted

## 2019-12-19 DIAGNOSIS — E782 Mixed hyperlipidemia: Secondary | ICD-10-CM

## 2019-12-19 DIAGNOSIS — Z79899 Other long term (current) drug therapy: Secondary | ICD-10-CM

## 2019-12-19 NOTE — Telephone Encounter (Signed)
-----   Message from Lendon Colonel, NP sent at 12/19/2019 12:12 PM EDT ----- Regarding: FW: Statin metrics Please order follow up lipids and LFT' on this patient for October 2021. Thank you!  K ----- Message ----- From: Rudean Haskell, [facility] Sent: 12/19/2019   9:36 AM EDT To: Lendon Colonel, NP Subject: Statin metrics                                 Hi Dennis [name],  I am a [facility] pharmacist reviewing patients for statin metrics. Dennis [name] is currently on a statin (pravastatin 20mg ) but is failing SPC (statin use in patients with cardiovascular disease) due to dose being low instead of mod-high (40-80mg ) per guidelines.  Noted he had o/v with you in April and HLD addressed.  Per notes, patient was planning on lifestyle modifications plus current medication regimen to improve TGs and lipid panel, if unable, plan for referral to lipid clinic.   I did not see f/u visit or orders to recheck in [facility] so  wanted to double check with you to see about them.  I can f/u next month re: possible lipid clinic referral if necessary.  If dose increase clinically appropriate based on labs, please consider increase to 40mg .   Thanks so much, Ralene Bathe, PharmD, [place] [phone]

## 2019-12-19 NOTE — Telephone Encounter (Signed)
Fasting Lipid and Liver ordered, Lab slip mailed to pt

## 2019-12-28 DIAGNOSIS — R05 Cough: Secondary | ICD-10-CM | POA: Diagnosis not present

## 2019-12-28 DIAGNOSIS — J029 Acute pharyngitis, unspecified: Secondary | ICD-10-CM | POA: Diagnosis not present

## 2019-12-28 DIAGNOSIS — Z20822 Contact with and (suspected) exposure to covid-19: Secondary | ICD-10-CM | POA: Diagnosis not present

## 2020-01-21 DIAGNOSIS — N1831 Chronic kidney disease, stage 3a: Secondary | ICD-10-CM | POA: Diagnosis not present

## 2020-01-28 DIAGNOSIS — N1831 Chronic kidney disease, stage 3a: Secondary | ICD-10-CM | POA: Diagnosis not present

## 2020-01-28 DIAGNOSIS — E785 Hyperlipidemia, unspecified: Secondary | ICD-10-CM | POA: Diagnosis not present

## 2020-01-28 DIAGNOSIS — I129 Hypertensive chronic kidney disease with stage 1 through stage 4 chronic kidney disease, or unspecified chronic kidney disease: Secondary | ICD-10-CM | POA: Diagnosis not present

## 2020-01-31 ENCOUNTER — Ambulatory Visit: Payer: Medicare HMO | Admitting: Cardiovascular Disease

## 2020-01-31 ENCOUNTER — Encounter: Payer: Self-pay | Admitting: Cardiovascular Disease

## 2020-01-31 ENCOUNTER — Telehealth: Payer: Self-pay | Admitting: Cardiovascular Disease

## 2020-01-31 ENCOUNTER — Other Ambulatory Visit: Payer: Self-pay | Admitting: Cardiovascular Disease

## 2020-01-31 ENCOUNTER — Other Ambulatory Visit: Payer: Self-pay

## 2020-01-31 DIAGNOSIS — E785 Hyperlipidemia, unspecified: Secondary | ICD-10-CM | POA: Diagnosis not present

## 2020-01-31 DIAGNOSIS — I2510 Atherosclerotic heart disease of native coronary artery without angina pectoris: Secondary | ICD-10-CM

## 2020-01-31 DIAGNOSIS — Z9861 Coronary angioplasty status: Secondary | ICD-10-CM | POA: Diagnosis not present

## 2020-01-31 DIAGNOSIS — I10 Essential (primary) hypertension: Secondary | ICD-10-CM | POA: Diagnosis not present

## 2020-01-31 NOTE — Assessment & Plan Note (Signed)
History of CAD status post remote RCA and circumflex intervention by Dr. Glade Lloyd in 2002.  Dr. Claiborne Billings recath him 09/30/2014 revealing high-grade mid circumflex stenosis and he was stented 2 days later by Dr. Angelena Form with a Promus Premier drug-eluting stent dilated to 4.5 mm.  I performed repeat catheterization on him via the right radial approach 11/17/2016 revealing a 70% AV groove circumflex with a positive FFR that I stented with a 3 mm x 12 mm long Synergy drug-eluting stent postdilated to 3.25 mm.  Has been pain-free since.

## 2020-01-31 NOTE — Telephone Encounter (Signed)
Med list updated

## 2020-01-31 NOTE — Patient Instructions (Signed)

## 2020-01-31 NOTE — Telephone Encounter (Signed)
New message    Patient was seen this morning.  He was asked to call back with the names of the medication he took this am.  He took vitamin C 1000mg , plavix 75mg , fenofibrate 160mg , levothyroxin 112 mcg, magnesium oxide 400mg , metoprolol 50mg , micardis 80mg  and vitamin 2000.  He will take this afternoon 81mg  aspirin and pravastatin 20mg .  He takes zegerid as needed but did not take it this am.

## 2020-01-31 NOTE — Progress Notes (Signed)
01/31/2020 Dennis [name]   08-Jan-1946  [id number]  Primary Physician Merrilee Seashore, MD Primary Cardiologist: Lorretta Harp MD [name], [place]  HPI:  Dennis [name] is a 74 y.o.  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 He is a patient of Dr.Ramachandran  I last saw him in the office 03/08/2019.Marland Kitchen History of hyperlipidemia and CAD status post RCA and circumflex intervention by Dr. Glade Lloyd back in 2002. He was admitted to [facility] 09/30/14 with unstable angina. He underwent cardiac catheterization that day by Dr. Claiborne Billings revealing a high-grade mid circumflex lesion on a bend and was stented 2 days later by Dr. Julianne Handler with a Promus premiere drug eluting stent postdilated with a 4.5 mm balloon.When I saw him 3 months ago he had experienced several episodes of nitrate responsive chest pain. I ultimately decided to perform outpatient cardiac catheterization. The right radial approach on 11/17/16 revealing a widely patent proximal circumflex stent, widely patent RCA with 70% lesion in the AV groove circumflex beyond the previously stented segment. I performed FFR which was 0.7 suggesting this was physiologically significant and stented that with a 3 mm x 12 mm long synergy drug-eluting stent post dilating up to 3.25 mm. This pain has since  He had developed  pain in his right leg with some swelling. 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 He ultimately had right above to below the knee bypass grafting by Dr. Sherren Mocha Early 11/30/2018 which he has still slowly healing from.   Since I saw him 4 months ago he continues to do well.  He denies chest pain or shortness of breath.  His blood pressure is somewhat elevated today.  His most recent lipid profile performed 11/15/2019 revealed a total cholesterol of 140, LDL 71 and HDL 25.   Current Meds    Medication Sig  . ascorbic acid (VITAMIN C) 1000 MG tablet Take 1,000 mg by mouth daily.  Marland Kitchen aspirin 81 MG tablet Take 81 mg by mouth every evening.   . chlorthalidone (HYGROTON) 25 MG tablet Take 1 tablet (25 mg total) by mouth daily.  . Cholecalciferol (VITAMIN D) 50 MCG (2000 UT) tablet Take 4,000 Units by mouth daily.  . clopidogrel (PLAVIX) 75 MG tablet Take 1 tablet (75 mg total) by mouth daily.  . fenofibrate 160 MG tablet Take 160 mg by mouth daily.  Marland Kitchen gabapentin (NEURONTIN) 300 MG capsule Take 1 capsule (300 mg total) by mouth daily. (Patient taking differently: Take 300 mg by mouth daily as needed (pain). )  . hydrochlorothiazide (HYDRODIURIL) 25 MG tablet Take 25 mg by mouth daily.  Marland Kitchen levothyroxine (SYNTHROID, LEVOTHROID) 112 MCG tablet Take 112 mcg by mouth daily.  . magnesium oxide (MAG-OX) 400 MG tablet Take 400 mg by mouth daily.  . metoprolol succinate (TOPROL-XL) 50 MG 24 hr tablet Take 3 tablets (150 mg total) by mouth daily.  . nitroGLYCERIN (NITROSTAT) 0.4 MG SL tablet PLACE 1 TABLET UNDER THE TONGUE EVERY 5 MINUTES AS NEEDED FOR CHEST PAIN  . Omeprazole-Sodium Bicarbonate (ZEGERID OTC) 20-1100 MG CAPS capsule Take 1 capsule by mouth daily as needed (acid reflux).  . pravastatin (PRAVACHOL) 20 MG tablet Take 20 mg by mouth daily.  . ramipril (ALTACE) 10 MG capsule Take 10 mg by mouth every evening.   Marland Kitchen telmisartan (MICARDIS) 80 MG tablet Take 80 mg by mouth daily.  . [DISCONTINUED] omega-3 acid  ethyl esters (LOVAZA) 1 g capsule Take 1 g by mouth daily.      No Known Allergies  Social History   Socioeconomic History  . Marital status: Married    Spouse name: Not on file  . Number of children: Not on file  . Years of education: Not on file  . Highest education level: Not on file  Occupational History  . Not on file  Tobacco Use  . Smoking status: Former Smoker    Packs/day: 1.00    Years: 40.00    Pack years: 40.00    Types: Cigarettes    Quit date: 03/06/2001     Years since quitting: 18.9  . Smokeless tobacco: Never Used  Vaping Use  . Vaping Use: Never used  Substance and Sexual Activity  . Alcohol use: Yes    Comment: occ  . Drug use: No  . Sexual activity: Not on file  Other Topics Concern  . Not on file  Social History Narrative  . Not on file   Social Determinants of Health   Financial Resource Strain:   . Difficulty of Paying Living Expenses: Not on file  Food Insecurity:   . Worried About Charity fundraiser in the Last Year: Not on file  . Ran Out of Food in the Last Year: Not on file  Transportation Needs:   . Lack of Transportation (Medical): Not on file  . Lack of Transportation (Non-Medical): Not on file  Physical Activity:   . Days of Exercise per Week: Not on file  . Minutes of Exercise per Session: Not on file  Stress:   . Feeling of Stress : Not on file  Social Connections:   . Frequency of Communication with Friends and Family: Not on file  . Frequency of Social Gatherings with Friends and Family: Not on file  . Attends Religious Services: Not on file  . Active Member of Clubs or Organizations: Not on file  . Attends Archivist Meetings: Not on file  . Marital Status: Not on file  Intimate Partner Violence:   . Fear of Current or Ex-Partner: Not on file  . Emotionally Abused: Not on file  . Physically Abused: Not on file  . Sexually Abused: Not on file     Review of Systems: General: negative for chills, fever, night sweats or weight changes.  Cardiovascular: negative for chest pain, dyspnea on exertion, edema, orthopnea, palpitations, paroxysmal nocturnal dyspnea or shortness of breath Dermatological: negative for rash Respiratory: negative for cough or wheezing Urologic: negative for hematuria Abdominal: negative for nausea, vomiting, diarrhea, bright red blood per rectum, melena, or hematemesis Neurologic: negative for visual changes, syncope, or dizziness All other systems reviewed and are  otherwise negative except as noted above.    Blood pressure (!) 160/92, pulse 66, height 5\' 8"  (1.727 m), weight 219 lb (99.3 kg), SpO2 95 %.  General appearance: alert and no distress Neck: no adenopathy, no carotid bruit, no JVD, supple, symmetrical, trachea midline and thyroid not enlarged, symmetric, no tenderness/mass/nodules Lungs: clear to auscultation bilaterally Heart: regular rate and rhythm, S1, S2 normal, no murmur, click, rub or gallop Extremities: extremities normal, atraumatic, no cyanosis or edema Pulses: 2+ and symmetric Skin: Skin color, texture, turgor normal. No rashes or lesions Neurologic: Alert and oriented X 3, normal strength and tone. Normal symmetric reflexes. Normal coordination and gait  EKG not performed today  ASSESSMENT AND PLAN:   Dyslipidemia History of dyslipidemia on fenofibrate and pravastatin with  lipid profile performed 11/15/2019 revealing total cholesterol 140, LDL of 71 and HDL of 25.  CAD S/P multiple PCIs History of CAD status post remote RCA and circumflex intervention by Dr. Glade Lloyd in 2002.  Dr. Claiborne Billings recath him 09/30/2014 revealing high-grade mid circumflex stenosis and he was stented 2 days later by Dr. Angelena Form with a Promus Premier drug-eluting stent dilated to 4.5 mm.  I performed repeat catheterization on him via the right radial approach 11/17/2016 revealing a 70% AV groove circumflex with a positive FFR that I stented with a 3 mm x 12 mm long Synergy drug-eluting stent postdilated to 3.25 mm.  Has been pain-free since.  Essential hypertension History of essential hypertension a blood pressure measured today at 160/92.  He is on chlorthalidone metoprolol and Micardis      Lorretta Harp MD [facility], [facility] 01/31/2020 10:19 AM

## 2020-01-31 NOTE — Assessment & Plan Note (Signed)
History of essential hypertension a blood pressure measured today at 160/92.  He is on chlorthalidone metoprolol and Micardis

## 2020-01-31 NOTE — Addendum Note (Signed)
Addended by: Crissie Reese on: 01/31/2020 10:24 AM   Modules accepted: Orders

## 2020-01-31 NOTE — Assessment & Plan Note (Signed)
History of dyslipidemia on fenofibrate and pravastatin with lipid profile performed 11/15/2019 revealing total cholesterol 140, LDL of 71 and HDL of 25.

## 2020-02-01 NOTE — Telephone Encounter (Signed)
Thank you  JJB

## 2020-02-05 DIAGNOSIS — L570 Actinic keratosis: Secondary | ICD-10-CM | POA: Diagnosis not present

## 2020-02-05 DIAGNOSIS — L821 Other seborrheic keratosis: Secondary | ICD-10-CM | POA: Diagnosis not present

## 2020-02-05 DIAGNOSIS — Z85828 Personal history of other malignant neoplasm of skin: Secondary | ICD-10-CM | POA: Diagnosis not present

## 2020-02-17 DIAGNOSIS — R69 Illness, unspecified: Secondary | ICD-10-CM | POA: Diagnosis not present

## 2020-03-26 DIAGNOSIS — R69 Illness, unspecified: Secondary | ICD-10-CM | POA: Diagnosis not present

## 2020-04-08 ENCOUNTER — Emergency Department (HOSPITAL_COMMUNITY): Payer: Medicare HMO

## 2020-04-08 ENCOUNTER — Observation Stay (HOSPITAL_COMMUNITY)
Admission: EM | Admit: 2020-04-08 | Discharge: 2020-04-09 | Discharge status: Against Medical Advice | Payer: Medicare HMO | Attending: Internal Medicine | Admitting: Internal Medicine

## 2020-04-08 DIAGNOSIS — Z87891 Personal history of nicotine dependence: Secondary | ICD-10-CM | POA: Insufficient documentation

## 2020-04-08 DIAGNOSIS — I200 Unstable angina: Secondary | ICD-10-CM

## 2020-04-08 DIAGNOSIS — Z79899 Other long term (current) drug therapy: Secondary | ICD-10-CM | POA: Diagnosis not present

## 2020-04-08 DIAGNOSIS — Z85828 Personal history of other malignant neoplasm of skin: Secondary | ICD-10-CM | POA: Insufficient documentation

## 2020-04-08 DIAGNOSIS — E039 Hypothyroidism, unspecified: Secondary | ICD-10-CM | POA: Insufficient documentation

## 2020-04-08 DIAGNOSIS — R079 Chest pain, unspecified: Secondary | ICD-10-CM | POA: Diagnosis not present

## 2020-04-08 DIAGNOSIS — I10 Essential (primary) hypertension: Secondary | ICD-10-CM | POA: Diagnosis not present

## 2020-04-08 DIAGNOSIS — Z7982 Long term (current) use of aspirin: Secondary | ICD-10-CM | POA: Insufficient documentation

## 2020-04-08 DIAGNOSIS — R0902 Hypoxemia: Secondary | ICD-10-CM | POA: Diagnosis not present

## 2020-04-08 DIAGNOSIS — I4510 Unspecified right bundle-branch block: Secondary | ICD-10-CM | POA: Diagnosis not present

## 2020-04-08 DIAGNOSIS — I2510 Atherosclerotic heart disease of native coronary artery without angina pectoris: Secondary | ICD-10-CM | POA: Insufficient documentation

## 2020-04-08 DIAGNOSIS — Z20822 Contact with and (suspected) exposure to covid-19: Secondary | ICD-10-CM | POA: Insufficient documentation

## 2020-04-08 DIAGNOSIS — R0789 Other chest pain: Secondary | ICD-10-CM | POA: Diagnosis not present

## 2020-04-08 DIAGNOSIS — Z955 Presence of coronary angioplasty implant and graft: Secondary | ICD-10-CM | POA: Insufficient documentation

## 2020-04-08 DIAGNOSIS — R42 Dizziness and giddiness: Secondary | ICD-10-CM | POA: Diagnosis not present

## 2020-04-08 LAB — CBC WITH DIFFERENTIAL/PLATELET
Abs Immature Granulocytes: 0.02 10*3/uL (ref 0.00–0.07)
Basophils Absolute: 0.1 10*3/uL (ref 0.0–0.1)
Basophils Relative: 1 %
Eosinophils Absolute: 0.1 10*3/uL (ref 0.0–0.5)
Eosinophils Relative: 3 %
HCT: 42.1 % (ref 39.0–52.0)
Hemoglobin: 13.8 g/dL (ref 13.0–17.0)
Immature Granulocytes: 0 %
Lymphocytes Relative: 19 %
Lymphs Abs: 1.1 10*3/uL (ref 0.7–4.0)
MCH: 30.8 pg (ref 26.0–34.0)
MCHC: 32.8 g/dL (ref 30.0–36.0)
MCV: 94 fL (ref 80.0–100.0)
Monocytes Absolute: 0.6 10*3/uL (ref 0.1–1.0)
Monocytes Relative: 11 %
Neutro Abs: 3.8 10*3/uL (ref 1.7–7.7)
Neutrophils Relative %: 66 %
Platelets: 227 10*3/uL (ref 150–400)
RBC: 4.48 MIL/uL (ref 4.22–5.81)
RDW: 13.2 % (ref 11.5–15.5)
WBC: 5.7 10*3/uL (ref 4.0–10.5)
nRBC: 0 % (ref 0.0–0.2)

## 2020-04-08 LAB — I-STAT CHEM 8, ED
BUN: 22 mg/dL (ref 8–23)
Calcium, Ion: 1.17 mmol/L (ref 1.15–1.40)
Chloride: 105 mmol/L (ref 98–111)
Creatinine, Ser: 1.3 mg/dL — ABNORMAL HIGH (ref 0.61–1.24)
Glucose, Bld: 117 mg/dL — ABNORMAL HIGH (ref 70–99)
HCT: 40 % (ref 39.0–52.0)
Hemoglobin: 13.6 g/dL (ref 13.0–17.0)
Potassium: 4.1 mmol/L (ref 3.5–5.1)
Sodium: 142 mmol/L (ref 135–145)
TCO2: 26 mmol/L (ref 22–32)

## 2020-04-08 LAB — RESPIRATORY PANEL BY RT PCR (FLU A&B, COVID)
Influenza A by PCR: NEGATIVE
Influenza B by PCR: NEGATIVE
SARS Coronavirus 2 by RT PCR: NEGATIVE

## 2020-04-08 LAB — COMPREHENSIVE METABOLIC PANEL
ALT: 18 U/L (ref 0–44)
AST: 20 U/L (ref 15–41)
Albumin: 3.6 g/dL (ref 3.5–5.0)
Alkaline Phosphatase: 39 U/L (ref 38–126)
Anion gap: 10 (ref 5–15)
BUN: 19 mg/dL (ref 8–23)
CO2: 24 mmol/L (ref 22–32)
Calcium: 9.1 mg/dL (ref 8.9–10.3)
Chloride: 105 mmol/L (ref 98–111)
Creatinine, Ser: 1.41 mg/dL — ABNORMAL HIGH (ref 0.61–1.24)
GFR, Estimated: 52 mL/min — ABNORMAL LOW (ref >60)
Glucose, Bld: 123 mg/dL — ABNORMAL HIGH (ref 70–99)
Potassium: 4.2 mmol/L (ref 3.5–5.1)
Sodium: 139 mmol/L (ref 135–145)
Total Bilirubin: 0.4 mg/dL (ref 0.3–1.2)
Total Protein: 6.5 g/dL (ref 6.5–8.1)

## 2020-04-08 LAB — TROPONIN I (HIGH SENSITIVITY): Troponin I (High Sensitivity): 6 ng/L (ref <18)

## 2020-04-08 NOTE — ED Triage Notes (Signed)
Pt BIB EMS [facility] with c.o chest pain that started at 2000, pt called , significant cardiac hx, 3 stents placed , pain is left side , elephant sitting on chest, pain 6/10 before nitro, after nitro pain 0. Pt took 2 of home Nitro SL pain went away, then came back , pt then took another nitro then called EMS. PT also took 324 ASA prior to EMS arrival.   Pt report pain is same with previous stent placement.   EKG Sinus with right bundle block ,   VS:   161/88 CBG 134 HR 68 RR 18  RA 97    16 gauge left AC

## 2020-04-08 NOTE — ED Provider Notes (Signed)
[facility] EMERGENCY DEPARTMENT Provider Note   CSN: [id number] Arrival date & time: 04/08/20  2048     History Chief Complaint  Patient presents with  . Chest Pain    Dennis [name] is a 74 y.o. male hx of CAD s/p stents, HTN, MI, here with chest pain. Patient was raking leaves and sudden onset of chest pressure. He states that it felt like elephant sitting on his chest. He took 3 nitros and pain resolved. About an hour later, the pain came back. Patient took another nitro and was given 4 baby aspirin by EMS and now is pain-free. Patient is concerned because he felt the same way before his first stent. Patient follows with Dr. Gwenlyn Found from cardiology.   The history is provided by the patient.       Past Medical History:  Diagnosis Date  . Anginal pain ([place])    denied chest pain 11/28/18  . CAD (coronary artery disease)    a. 2002 Stent RCA & circumflex EF40-50%   . GERD (gastroesophageal reflux disease)   . Hyperlipidemia   . Hypertension   . Hypothyroidism   . Myocardial infarction ([place]) 03/2001  . Squamous cell carcinoma of scalp    "some burned, some cut off" (11/17/2016)  . Thyroid nodule, hot    s/p radioactive iodine, now hypothyroidism    Patient Active Problem List   Diagnosis Date Noted  . 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 Essential hypertension 10/03/2014  . RBBB 10/03/2014  . Unstable angina ([place]) 09/30/2014  . Hypothyroidism   . Dyslipidemia   . CAD S/P multiple PCIs   . Thyroid nodule, hot     Past Surgical History:  Procedure Laterality Date  . BYPASS GRAFT POPLITEAL TO POPLITEAL Right 11/30/2018   Procedure: RIGHT POPLITEAL ARTERY ANEURYSM REPAIR WITH ABOVE KNEE POPLITEAL TO BELOW KNEE POPLITEAL BY PASS GRAFT USING SAPPEHNOUS VEIN;  Surgeon: Rosetta Posner, MD;  Location: [facility];  Service: Vascular;  Laterality: Right;  . CARDIAC CATHETERIZATION  09/2014   "day before they put  the stent in"  . CORONARY ANGIOPLASTY WITH STENT PLACEMENT  03/2001; 09/2014; 11/17/2016   "1+1+1"  . CORONARY STENT INTERVENTION N/A 11/17/2016   Procedure: Coronary Stent Intervention;  Surgeon: Lorretta Harp, MD;  Location: [facility];  Service: Cardiovascular;  Laterality: N/A;  . INTRAVASCULAR PRESSURE WIRE/FFR STUDY N/A 11/17/2016   Procedure: Intravascular Pressure Wire/FFR Study;  Surgeon: Lorretta Harp, MD;  Location: [facility];  Service: Cardiovascular;  Laterality: N/A;  . LEFT HEART CATH AND CORONARY ANGIOGRAPHY N/A 11/17/2016   Procedure: Left Heart Cath and Coronary Angiography;  Surgeon: Lorretta Harp, MD;  Location: [facility] [facility];  Service: Cardiovascular;  Laterality: N/A;  . LEFT HEART CATHETERIZATION WITH CORONARY ANGIOGRAM N/A 09/30/2014   Procedure: LEFT HEART CATHETERIZATION WITH CORONARY ANGIOGRAM;  Surgeon: Troy Sine, MD;  Location: [facility] CATH LAB;  Service: Cardiovascular;  Laterality: N/A;  . PERCUTANEOUS CORONARY STENT INTERVENTION (PCI-S) N/A 10/02/2014   Procedure: PERCUTANEOUS CORONARY STENT INTERVENTION (PCI-S);  Surgeon: Burnell Blanks, MD;  Location: [facility] CATH LAB;  Service: Cardiovascular;  Laterality: N/A;  . SQUAMOUS CELL CARCINOMA EXCISION     "scalp"  . WISDOM TOOTH EXTRACTION         Family History  Family history unknown: Yes    Social History   Tobacco Use  . Smoking status: Former Smoker  Packs/day: 1.00    Years: 40.00    Pack years: 40.00    Types: Cigarettes    Quit date: 03/06/2001    Years since quitting: 19.1  . Smokeless tobacco: Never Used  Vaping Use  . Vaping Use: Never used  Substance Use Topics  . Alcohol use: Yes    Comment: occ  . Drug use: No    Home Medications Prior to Admission medications   Medication Sig Start Date End Date Taking? Authorizing Provider  ascorbic acid (VITAMIN C) 1000 MG tablet Take 1,000 mg by mouth daily.    [provider]  aspirin 81 MG tablet  Take 81 mg by mouth every evening.     [provider]  Cholecalciferol (VITAMIN D) 50 MCG (2000 UT) tablet Take 4,000 Units by mouth daily.    [provider]  clopidogrel (PLAVIX) 75 MG tablet Take 1 tablet (75 mg total) by mouth daily. 05/30/19   Lendon Colonel, NP  fenofibrate 160 MG tablet Take 160 mg by mouth daily. 07/28/14   [provider]  levothyroxine (SYNTHROID, LEVOTHROID) 112 MCG tablet Take 112 mcg by mouth daily. 09/19/14   [provider]  magnesium oxide (MAG-OX) 400 MG tablet Take 400 mg by mouth daily.    [provider]  metoprolol succinate (TOPROL-XL) 50 MG 24 hr tablet Take 50 mg by mouth daily. Take with or immediately following a meal.    [provider]  nitroGLYCERIN (NITROSTAT) 0.4 MG SL tablet DISSOLVE ONE TABLET UNDER THE TONGUE EVERY 5 MINUTES AS NEEDED FOR CHEST PAIN. 02/03/20   Lorretta Harp, MD  Omeprazole-Sodium Bicarbonate (ZEGERID OTC) 20-1100 MG CAPS capsule Take 1 capsule by mouth daily as needed (acid reflux).    [provider]  pravastatin (PRAVACHOL) 20 MG tablet Take 20 mg by mouth daily. 08/18/19   [provider]  telmisartan (MICARDIS) 80 MG tablet Take 80 mg by mouth daily. 12/18/19   [provider]    Allergies    Patient has no known allergies.  Review of Systems   Review of Systems  Cardiovascular: Positive for chest pain.  All other systems reviewed and are negative.   Physical Exam Updated Vital Signs BP (!) 183/103 (BP Location: Right Arm)   Pulse 89   Temp 97.6 F (36.4 C) (Oral)   Resp (!) 22   Ht 5\' 8"  (1.727 m)   Wt 99.8 kg   SpO2 96%   BMI 33.45 kg/m   Physical Exam Vitals and nursing note reviewed.  HENT:     Head: Normocephalic.  Eyes:     Extraocular Movements: Extraocular movements intact.     Pupils: Pupils are equal, round, and reactive to light.  Cardiovascular:     Rate and Rhythm: Normal rate and regular rhythm.      Heart sounds: Normal heart sounds.  Pulmonary:     Effort: Pulmonary effort is normal.     Breath sounds: Normal breath sounds.  Abdominal:     General: Bowel sounds are normal.     Palpations: Abdomen is soft.  Musculoskeletal:        General: Normal range of motion.     Cervical back: Normal range of motion.  Skin:    General: Skin is warm.     Capillary Refill: Capillary refill takes less than 2 seconds.  Neurological:     General: No focal deficit present.     Mental Status: He is alert and oriented to  person, place, and time.  Psychiatric:        Mood and Affect: Mood normal.        Behavior: Behavior normal.     ED Results / Procedures / Treatments   Labs (all labs ordered are listed, but only abnormal results are displayed) Labs Reviewed  COMPREHENSIVE METABOLIC PANEL - Abnormal; Notable for the following components:      Result Value   Glucose, Bld 123 (*)    Creatinine, Ser 1.41 (*)    GFR, Estimated 52 (*)    All other components within normal limits  I-STAT CHEM 8, ED - Abnormal; Notable for the following components:   Creatinine, Ser 1.30 (*)    Glucose, Bld 117 (*)    All other components within normal limits  RESPIRATORY PANEL BY RT PCR (FLU A&B, COVID)  CBC WITH DIFFERENTIAL/PLATELET  TROPONIN I (HIGH SENSITIVITY)    EKG EKG Interpretation  Date/Time:  Wednesday April 08 2020 20:54:20 EDT Ventricular Rate:  62 PR Interval:    QRS Duration: 151 QT Interval:  457 QTC Calculation: 465 R Axis:   -55 Text Interpretation: Atrial fibrillation RBBB and LAFB Left ventricular hypertrophy No significant change since last tracing Confirmed by Wandra Arthurs ([id number]) on 04/08/2020 8:56:42 PM   Radiology DG Chest Port 1 View  Result Date: 04/08/2020 CLINICAL DATA:  Chest pain. EXAM: PORTABLE CHEST 1 VIEW COMPARISON:  Radiograph 06/07/2018. FINDINGS: Stable upper normal heart size. Unchanged mediastinal contours. Aortic atherosclerosis. There is no pulmonary  edema. No focal airspace disease. No pleural fluid or pneumothorax. No acute osseous abnormalities are seen. IMPRESSION: No acute chest findings. Electronically Signed   By: Keith Rake M.D.   On: 04/08/2020 21:18    Procedures Procedures (including critical care time)  Medications Ordered in ED Medications - No data to display  ED Course  I have reviewed the triage vital signs and the nursing notes.  Pertinent labs & imaging results that were available during my care of the patient were reviewed by me and considered in my medical decision making (see chart for details).    MDM Rules/Calculators/A&P                         Dennis [name] is a 74 y.o. male here with chest pain. Patient has significant CAD with stents. He had chest pain with exertion that resolved with nitro. High risk for ACS. Plan to get CBC CMP and troponin. Will need to consult cardiology and patient will need admission for rule out ACS  10:34 PM I discussed case with Dr. Blossom Hoops from cardiology.  He states that since the first troponin is negative, he recommends second troponin and if it is positive, he will come and admit the patient.  If it is negative he does recommend hospitalist admission for rule out ACS.   11:24 PM Signed out to Dr. Randal Buba to follow up troponin. Patient will need to admit to hospitalist if negative, cardiology if positive.   Final Clinical Impression(s) / ED Diagnoses Final diagnoses:  None    Rx / DC Orders ED Discharge Orders    None       Drenda Freeze, MD 04/08/20 2324

## 2020-04-09 ENCOUNTER — Encounter (HOSPITAL_COMMUNITY): Payer: Self-pay | Admitting: Emergency Medicine

## 2020-04-09 DIAGNOSIS — R079 Chest pain, unspecified: Secondary | ICD-10-CM | POA: Diagnosis present

## 2020-04-09 LAB — TROPONIN I (HIGH SENSITIVITY): Troponin I (High Sensitivity): 6 ng/L (ref <18)

## 2020-04-09 NOTE — ED Notes (Signed)
AMA form signed and to be scanned into chart, pt and spouse at bedside educated.

## 2020-04-09 NOTE — ED Notes (Signed)
Randal Buba, MD made aware of Hal Hope, MD instructions in regardless to pt wish to leave AMA. Palumbo, MD states " Pt can leave AMA at anytime." Pt and spouse at bedside made aware. AMA papers agreed to be signed by pt prior to departure.

## 2020-04-09 NOTE — ED Notes (Addendum)
Pt questioning admitting decision, expressing desire to leave. This RN spoke with pt, hospitalized paged and made aware of pt wishes, Cyndia Diver, MD instructed this RN to make emergency MD aware.

## 2020-04-09 NOTE — Consult Note (Signed)
Cardiology Consult    Patient ID: Dennis [name] MRN: [id number], DOB/AGE: 09-08-1945   Admit date: 04/08/2020 Date of Consult: 04/09/2020  Primary Physician: Merrilee Seashore, MD Primary Cardiologist: [name], MD Requesting Provider: Dr. Shirlyn Goltz, MD  Patient Profile    Dennis [name] is a 74 y.o. male with a history of hyperlipidemia and coronary artery disease with multiple prior interventions detailed below with most recent PCI of RCA in 2018 and peripheral arterial disease who presents after an episode of chest pain.  History of Present Illness    Mr Haydu reports he has been doing well from the perspective of his chest pain but today, after raking and bagging leaves (much more strenuous activity than he usually performs) he developed an episode of chest pain that was unresponsive to nitroglycerin.  Given this, he presented to the ED for further evaluation.  On my evaluation he is chest pain free and has been since the episode.  He reports some associated shortness of breath with the chest pain that is now relieved.  He has been adherent to antianginals but reports that blood pressure has been elevated of late despite the addition of telmisartan and systolic blood pressure was as high as 200 at time of his chest pain blood pressure (now recovered to 140/90 range).  ECG personally reviewed and showed normal sinus rhythm with RBBB and non-specific ST chagnes unchanged from prior.  Serial troponin >6 hour from event 6 and 6 on repeat.  Reviewed with him that he should have an uptitration of his antianginals and graded increase in exertion to which he was agreeable.  He'd had issues with amlodipine previously at 10 mg but I explained this dose frequently causes edema at that dose and we could trial lower dose to which he's agreeable.  He had similar presentation in 06/2018 with troponin negative chest pain  CV Meds ASA 81 mg daily Clopidogrel 75 mg  daily Fenofibrate 160 mg daily Metoprolol 50 mg daily Nitroglycerin as needed Pravastatin 20 mg daily Telmisartan 80 mg daily    Past Medical History   Past Medical History:  Diagnosis Date  . Anginal pain ([place])    denied chest pain 11/28/18  . CAD (coronary artery disease)    a. 2002 Stent RCA & circumflex EF40-50%   . GERD (gastroesophageal reflux disease)   . Hyperlipidemia   . Hypertension   . Hypothyroidism   . Myocardial infarction ([place]) 03/2001  . Squamous cell carcinoma of scalp    "some burned, some cut off" (11/17/2016)  . Thyroid nodule, hot    s/p radioactive iodine, now hypothyroidism    Past Surgical History:  Procedure Laterality Date  . BYPASS GRAFT POPLITEAL TO POPLITEAL Right 11/30/2018   Procedure: RIGHT POPLITEAL ARTERY ANEURYSM REPAIR WITH ABOVE KNEE POPLITEAL TO BELOW KNEE POPLITEAL BY PASS GRAFT USING SAPPEHNOUS VEIN;  Surgeon: Rosetta Posner, MD;  Location: [facility];  Service: Vascular;  Laterality: Right;  . CARDIAC CATHETERIZATION  09/2014   "day before they put the stent in"  . CORONARY ANGIOPLASTY WITH STENT PLACEMENT  03/2001; 09/2014; 11/17/2016   "1+1+1"  . CORONARY STENT INTERVENTION N/A 11/17/2016   Procedure: Coronary Stent Intervention;  Surgeon: Lorretta Harp, MD;  Location: [facility];  Service: Cardiovascular;  Laterality: N/A;  . INTRAVASCULAR PRESSURE WIRE/FFR STUDY N/A 11/17/2016   Procedure: Intravascular Pressure Wire/FFR Study;  Surgeon: Lorretta Harp, MD;  Location: [place];  Service: Cardiovascular;  Laterality: N/A;  .  LEFT HEART CATH AND CORONARY ANGIOGRAPHY N/A 11/17/2016   Procedure: Left Heart Cath and Coronary Angiography;  Surgeon: Lorretta Harp, MD;  Location: [facility];  Service: Cardiovascular;  Laterality: N/A;  . LEFT HEART CATHETERIZATION WITH CORONARY ANGIOGRAM N/A 09/30/2014   Procedure: LEFT HEART CATHETERIZATION WITH CORONARY ANGIOGRAM;  Surgeon: Troy Sine, MD;  Location: [facility] CATH  LAB;  Service: Cardiovascular;  Laterality: N/A;  . PERCUTANEOUS CORONARY STENT INTERVENTION (PCI-S) N/A 10/02/2014   Procedure: PERCUTANEOUS CORONARY STENT INTERVENTION (PCI-S);  Surgeon: Burnell Blanks, MD;  Location: [facility] CATH LAB;  Service: Cardiovascular;  Laterality: N/A;  . SQUAMOUS CELL CARCINOMA EXCISION     "scalp"  . WISDOM TOOTH EXTRACTION       No Known Allergies  Family History    Family History  Family history unknown: Yes   He indicated that his maternal grandmother is deceased. He indicated that his maternal grandfather is deceased. He indicated that his paternal grandmother is deceased. He indicated that his paternal grandfather is deceased.   Social History    Social History   Socioeconomic History  . Marital status: Married    Spouse name: Not on file  . Number of children: Not on file  . Years of education: Not on file  . Highest education level: Not on file  Occupational History  . Not on file  Tobacco Use  . Smoking status: Former Smoker    Packs/day: 1.00    Years: 40.00    Pack years: 40.00    Types: Cigarettes    Quit date: 03/06/2001    Years since quitting: 19.1  . Smokeless tobacco: Never Used  Vaping Use  . Vaping Use: Never used  Substance and Sexual Activity  . Alcohol use: Yes    Comment: occ  . Drug use: No  . Sexual activity: Not on file  Other Topics Concern  . Not on file  Social History Narrative  . Not on file   Social Determinants of Health   Financial Resource Strain:   . Difficulty of Paying Living Expenses: Not on file  Food Insecurity:   . Worried About Charity fundraiser in the Last Year: Not on file  . Ran Out of Food in the Last Year: Not on file  Transportation Needs:   . Lack of Transportation (Medical): Not on file  . Lack of Transportation (Non-Medical): Not on file  Physical Activity:   . Days of Exercise per Week: Not on file  . Minutes of Exercise per Session: Not on file  Stress:   . Feeling  of Stress : Not on file  Social Connections:   . Frequency of Communication with Friends and Family: Not on file  . Frequency of Social Gatherings with Friends and Family: Not on file  . Attends Religious Services: Not on file  . Active Member of Clubs or Organizations: Not on file  . Attends Archivist Meetings: Not on file  . Marital Status: Not on file  Intimate Partner Violence:   . Fear of Current or Ex-Partner: Not on file  . Emotionally Abused: Not on file  . Physically Abused: Not on file  . Sexually Abused: Not on file     Review of Systems    General:  No chills, fever, night sweats or weight changes.  Cardiovascular:  No chest pain, dyspnea on exertion, edema, orthopnea, palpitations, paroxysmal nocturnal dyspnea. Dermatological: No rash, lesions/masses Respiratory: No cough, dyspnea Urologic: No hematuria, dysuria  Abdominal:   No nausea, vomiting, diarrhea, bright red blood per rectum, melena, or hematemesis Neurologic:  No visual changes, wkns, changes in mental status. All other systems reviewed and are otherwise negative except as noted above.  Physical Exam    Blood pressure (!) 141/93, pulse 65, temperature 97.6 F (36.4 C), temperature source Oral, resp. rate 16, height 5\' 8"  (1.727 m), weight 99.8 kg, SpO2 92 %.    No intake or output data in the 24 hours ending 04/09/20 0034 Wt Readings from Last 3 Encounters:  04/08/20 99.8 kg  01/31/20 99.3 kg  11/26/19 99 kg    CONSTITUTIONAL: alert and conversant, well-appearing, nourished, no distress HEENT: oropharynx clear and moist, no mucosal lesions, normal dentition, conjunctiva normal, EOM intact, pupils equal, no lid lag. NECK: supple, no cervical adenopathy, no thyromegaly CARDIOVASCULAR: Regular rhythm. No gallop, murmur, or rub. Normal S1/S2. Radial pulses intact. No carotid bruits. PULMONARY/CHEST WALL: no deformities, normal breath sounds bilaterally, normal work of breathing ABDOMINAL: soft,  non-tender, non-distended EXTREMITIES: no edema or muscle atrophy, warm and well-perfused SKIN: Dry and intact without apparent rashes or wounds. NEUROLOGIC: alert, normal gait, no abnormal movements, cranial nerves grossly intact.  Labs   Troponin 6 on subseqeunt repeats Last LDL 71 GFR 52 AST not elevated  Radiology Studies    DG Chest Port 1 View  Result Date: 04/08/2020 CLINICAL DATA:  Chest pain. EXAM: PORTABLE CHEST 1 VIEW COMPARISON:  Radiograph 06/07/2018. FINDINGS: Stable upper normal heart size. Unchanged mediastinal contours. Aortic atherosclerosis. There is no pulmonary edema. No focal airspace disease. No pleural fluid or pneumothorax. No acute osseous abnormalities are seen. IMPRESSION: No acute chest findings. Electronically Signed   By: Keith Rake M.D.   On: 04/08/2020 21:18    ECG & Cardiac Imaging   ECG personally reviewed w/ NSR w/ RBBB, NSST changes unchanged from prior.  CV procedures 11/17/16 ostial to proximal RCA 40% stenosis Mid LCx 70% stenosis with PCI  Assessment & Plan   74 year old male with stable ischemic heart disease s/p PCI of RCA and most recently LCx in 2018 presenting with an episode of chest pain in setting of hypertension and increase in exertion.  hsTroponin negative x 2 @ 6 and ECG is unchanged from prior.  No recurrent chest pain since presentation here.  Problem list Coronary artery disease s/p PCI, SIHD Hypertension Dyslipidemia Peripheral arterial disease s/p R fem-pop  Recommendations - Continue aspirin/plavix/pravastatin/icosapent - Continue metoprolol succinate 50 mg daily - Continue telmisartan 80 mg daily (started in July, GFR fluctuation tolerable) - Start amlodipine 2.5 mg daily - If he has persistent pain at desired activity level despite maximally tolerated antianginal therapy can consider functional study.  Signed, Delight Hoh, MD 04/09/2020, 12:34 AM  For questions or updates, please contact   Please  consult [email or web address] for contact info under Cardiology/STEMI.

## 2020-04-10 ENCOUNTER — Other Ambulatory Visit: Payer: Self-pay

## 2020-04-10 ENCOUNTER — Ambulatory Visit: Payer: Medicare HMO | Admitting: Cardiovascular Disease

## 2020-04-10 ENCOUNTER — Encounter: Payer: Self-pay | Admitting: Cardiovascular Disease

## 2020-04-10 DIAGNOSIS — I2510 Atherosclerotic heart disease of native coronary artery without angina pectoris: Secondary | ICD-10-CM

## 2020-04-10 DIAGNOSIS — I10 Essential (primary) hypertension: Secondary | ICD-10-CM | POA: Diagnosis not present

## 2020-04-10 DIAGNOSIS — E785 Hyperlipidemia, unspecified: Secondary | ICD-10-CM | POA: Diagnosis not present

## 2020-04-10 DIAGNOSIS — Z9861 Coronary angioplasty status: Secondary | ICD-10-CM | POA: Diagnosis not present

## 2020-04-10 NOTE — Patient Instructions (Signed)

## 2020-04-10 NOTE — Assessment & Plan Note (Signed)
History of hyperlipidemia on statin therapy with lipid profile performed 11/15/2019 revealing total cholesterol 140, LDL 71 and HDL of 25.

## 2020-04-10 NOTE — Assessment & Plan Note (Signed)
History of CAD status post circumflex stenting by Dr. Angelena Form 10/02/2014.  He is otherwise noncritical CAD.  Dr. Angelena Form implanted a Promus Premier drug-eluting stent postdilated to 4.5 mm.  Because of nitrate responsive chest pain I recath him radially 11/17/2016 revealing widely patent stent with a 70% lesion in the AV groove circumflex just beyond this.  I performed FFR which was 0.7 suggesting this was physiologically significant and I stented him with a 3 mm x 12 mm long Synergy drug-eluting stent postdilated to 3.25 mm.  When I saw him 3 months ago he was doing well and was pain-free.  He recently raked leaves him back them and developed chest pain.  Seen in the emergency room 04/09/2020 and evaluated by cardiology fellow Dr. Blossom Hoops.  His troponins were negative and his EKG showed no acute changes.  His medicines were adjusted.  He said no recurrent symptoms.

## 2020-04-10 NOTE — Progress Notes (Signed)
04/10/2020 Dennis [name]   01-24-1946  [id number]  Primary Physician Merrilee Seashore, MD Primary Cardiologist: Lorretta Harp MD [name], [place]  HPI:  Dennis [name] is a 74 y.o.  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 He is a patient of Dr.Ramachandran  I last saw him in the office 01/31/2020.Marland Kitchen History of hyperlipidemia and CAD status post RCA and circumflex intervention by Dr. Glade Lloyd back in 2002. He was [facility]30[facility]14 with unstable angina. He underwent cardiac catheterization that day by Dr. Claiborne Billings revealing a high-grade mid circumflex lesion on a bend and was stented 2 days later by Dr. Julianne Handler with a Promus premiere drug eluting stent postdilated with a 4.5 mm balloon.When I saw him 3 months ago he had experienced several episodes of nitrate responsive chest pain. I ultimately decided to perform outpatient cardiac catheterization. The right radial approach on 11/17/16 revealing a widely patent proximal circumflex stent, widely patent RCA with 70% lesion in the AV groove circumflex beyond the previously stented segment. I performed FFR which was 0.7 suggesting this was physiologically significant and stented that with a 3 mm x 12 mm long synergy drug-eluting stent post dilating up to 3.25 mm. This pain has since  He had developed  pain in his right leg with some swelling. 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 He ultimately had right above to below the knee bypass grafting by Dr. Sherren Mocha Early 11/30/2018 which he has still slowly healing from.  Since I saw him  3 months ago he was doing well until the evening of 11/ 3 when he went to the ER with chest pain after raking leaves and back and then that day which is unusually strenuous activity for him.  His enzymes were negative and his EKG showed no acute changes.  He was seen by  Dr.Nipp, cardiology fellow, who assessed him and thought he was stable for discharge.  He did add amlodipine 2.5 mg a day.  He said no recurrent symptoms.  Blood pressures under good control at home.   Current Meds  Medication Sig  . amLODipine (NORVASC) 2.5 MG tablet Take 2.5 mg by mouth daily.  Marland Kitchen ascorbic acid (VITAMIN C) 1000 MG tablet Take 1,000 mg by mouth daily.  Marland Kitchen aspirin 81 MG tablet Take 81 mg by mouth every evening.   Marland Kitchen b complex vitamins capsule Take 1 capsule by mouth daily.  . Cholecalciferol (VITAMIN D) 50 MCG (2000 UT) tablet Take 4,000 Units by mouth daily.  . clopidogrel (PLAVIX) 75 MG tablet Take 1 tablet (75 mg total) by mouth daily.  . fenofibrate 160 MG tablet Take 160 mg by mouth daily.  Marland Kitchen levothyroxine (SYNTHROID, LEVOTHROID) 112 MCG tablet Take 112 mcg by mouth daily before breakfast.   . magnesium oxide (MAG-OX) 400 MG tablet Take 400 mg by mouth daily.  . metoprolol succinate (TOPROL-XL) 50 MG 24 hr tablet Take 50 mg by mouth daily. Take with or immediately following a meal.  . nitroGLYCERIN (NITROSTAT) 0.4 MG SL tablet DISSOLVE ONE TABLET UNDER THE TONGUE EVERY 5 MINUTES AS NEEDED FOR CHEST PAIN. (Patient taking differently: Place 0.4 mg under the tongue every 5 (five) minutes as needed for chest pain. )  . Omeprazole-Sodium Bicarbonate (ZEGERID OTC) 20-1100 MG CAPS capsule Take 1 capsule by mouth daily.   . pravastatin (PRAVACHOL) 20 MG tablet Take 20 mg  by mouth daily.  Marland Kitchen telmisartan (MICARDIS) 80 MG tablet Take 80 mg by mouth daily.  . [DISCONTINUED] metoprolol succinate (TOPROL-XL) 50 MG 24 hr tablet Take 3 tablets by mouth daily.     No Known Allergies  Social History   Socioeconomic History  . Marital status: Married    Spouse name: Not on file  . Number of children: Not on file  . Years of education: Not on file  . Highest education level: Not on file  Occupational History  . Not on file  Tobacco Use  . Smoking status: Former Smoker    Packs/day:  1.00    Years: 40.00    Pack years: 40.00    Types: Cigarettes    Quit date: 03/06/2001    Years since quitting: 19.1  . Smokeless tobacco: Never Used  Vaping Use  . Vaping Use: Never used  Substance and Sexual Activity  . Alcohol use: Yes    Comment: occ  . Drug use: No  . Sexual activity: Not on file  Other Topics Concern  . Not on file  Social History Narrative  . Not on file   Social Determinants of Health   Financial Resource Strain:   . Difficulty of Paying Living Expenses: Not on file  Food Insecurity:   . Worried About Charity fundraiser in the Last Year: Not on file  . Ran Out of Food in the Last Year: Not on file  Transportation Needs:   . Lack of Transportation (Medical): Not on file  . Lack of Transportation (Non-Medical): Not on file  Physical Activity:   . Days of Exercise per Week: Not on file  . Minutes of Exercise per Session: Not on file  Stress:   . Feeling of Stress : Not on file  Social Connections:   . Frequency of Communication with Friends and Family: Not on file  . Frequency of Social Gatherings with Friends and Family: Not on file  . Attends Religious Services: Not on file  . Active Member of Clubs or Organizations: Not on file  . Attends Archivist Meetings: Not on file  . Marital Status: Not on file  Intimate Partner Violence:   . Fear of Current or Ex-Partner: Not on file  . Emotionally Abused: Not on file  . Physically Abused: Not on file  . Sexually Abused: Not on file     Review of Systems: General: negative for chills, fever, night sweats or weight changes.  Cardiovascular: negative for chest pain, dyspnea on exertion, edema, orthopnea, palpitations, paroxysmal nocturnal dyspnea or shortness of breath Dermatological: negative for rash Respiratory: negative for cough or wheezing Urologic: negative for hematuria Abdominal: negative for nausea, vomiting, diarrhea, bright red blood per rectum, melena, or hematemesis  Neurologic: negative for visual changes, syncope, or dizziness All other systems reviewed and are otherwise negative except as noted above.    Blood pressure (!) 158/92, pulse 67, height 5\' 8"  (1.727 m), weight 219 lb (99.3 kg), SpO2 98 %.  General appearance: alert and no distress Neck: no adenopathy, no carotid bruit, no JVD, supple, symmetrical, trachea midline and thyroid not enlarged, symmetric, no tenderness/mass/nodules Lungs: clear to auscultation bilaterally Heart: regular rate and rhythm, S1, S2 normal, no murmur, click, rub or gallop Extremities: extremities normal, atraumatic, no cyanosis or edema Pulses: 2+ and symmetric Skin: Skin color, texture, turgor normal. No rashes or lesions Neurologic: Alert and oriented X 3, normal strength and tone. Normal symmetric reflexes. Normal coordination and gait  EKG not performed today  ASSESSMENT AND PLAN:   CAD S/P multiple PCIs History of CAD status post circumflex stenting by Dr. Angelena Form 10/02/2014.  He is otherwise noncritical CAD.  Dr. Angelena Form implanted a Promus Premier drug-eluting stent postdilated to 4.5 mm.  Because of nitrate responsive chest pain I recath him radially 11/17/2016 revealing widely patent stent with a 70% lesion in the AV groove circumflex just beyond this.  I performed FFR which was 0.7 suggesting this was physiologically significant and I stented him with a 3 mm x 12 mm long Synergy drug-eluting stent postdilated to 3.25 mm.  When I saw him 3 months ago he was doing well and was pain-free.  He recently raked leaves him back them and developed chest pain.  Seen in the emergency room 04/09/2020 and evaluated by cardiology fellow Dr. Blossom Hoops.  His troponins were negative and his EKG showed no acute changes.  His medicines were adjusted.  He said no recurrent symptoms.  Dyslipidemia History of hyperlipidemia on statin therapy with lipid profile performed 11/15/2019 revealing total cholesterol 140, LDL 71 and HDL of 25.   Essential hypertension History of essential hypertension a blood pressure measured today 158/92.  I did review his blood pressure readings at home which were much better than this.  He just started low-dose amlodipine 2.5 mg a day in addition to Toprol and Micardis.      Lorretta Harp MD FACP,FACC,FAHA, [facility] 04/10/2020 2:24 PM

## 2020-04-10 NOTE — Assessment & Plan Note (Signed)
History of essential hypertension a blood pressure measured today 158/92.  I did review his blood pressure readings at home which were much better than this.  He just started low-dose amlodipine 2.5 mg a day in addition to Toprol and Micardis.

## 2020-04-14 DIAGNOSIS — E785 Hyperlipidemia, unspecified: Secondary | ICD-10-CM | POA: Diagnosis not present

## 2020-04-14 DIAGNOSIS — I129 Hypertensive chronic kidney disease with stage 1 through stage 4 chronic kidney disease, or unspecified chronic kidney disease: Secondary | ICD-10-CM | POA: Diagnosis not present

## 2020-04-21 DIAGNOSIS — I2510 Atherosclerotic heart disease of native coronary artery without angina pectoris: Secondary | ICD-10-CM | POA: Diagnosis not present

## 2020-04-21 DIAGNOSIS — R7309 Other abnormal glucose: Secondary | ICD-10-CM | POA: Diagnosis not present

## 2020-04-21 DIAGNOSIS — N1831 Chronic kidney disease, stage 3a: Secondary | ICD-10-CM | POA: Diagnosis not present

## 2020-04-21 DIAGNOSIS — I129 Hypertensive chronic kidney disease with stage 1 through stage 4 chronic kidney disease, or unspecified chronic kidney disease: Secondary | ICD-10-CM | POA: Diagnosis not present

## 2020-04-21 DIAGNOSIS — E782 Mixed hyperlipidemia: Secondary | ICD-10-CM | POA: Diagnosis not present

## 2020-05-04 DIAGNOSIS — R69 Illness, unspecified: Secondary | ICD-10-CM | POA: Diagnosis not present

## 2020-06-10 DIAGNOSIS — N1831 Chronic kidney disease, stage 3a: Secondary | ICD-10-CM | POA: Diagnosis not present

## 2020-06-16 DIAGNOSIS — N1831 Chronic kidney disease, stage 3a: Secondary | ICD-10-CM | POA: Diagnosis not present

## 2020-06-16 DIAGNOSIS — E782 Mixed hyperlipidemia: Secondary | ICD-10-CM | POA: Diagnosis not present

## 2020-06-16 DIAGNOSIS — I2510 Atherosclerotic heart disease of native coronary artery without angina pectoris: Secondary | ICD-10-CM | POA: Diagnosis not present

## 2020-06-16 DIAGNOSIS — I129 Hypertensive chronic kidney disease with stage 1 through stage 4 chronic kidney disease, or unspecified chronic kidney disease: Secondary | ICD-10-CM | POA: Diagnosis not present

## 2020-06-18 ENCOUNTER — Other Ambulatory Visit: Payer: Self-pay | Admitting: Adult Health

## 2020-06-24 ENCOUNTER — Telehealth: Payer: Self-pay | Admitting: Cardiovascular Disease

## 2020-06-24 ENCOUNTER — Other Ambulatory Visit: Payer: Self-pay | Admitting: Adult Health

## 2020-06-24 MED ORDER — METOPROLOL SUCCINATE ER 50 MG PO TB24
50.0000 mg | ORAL_TABLET | Freq: Every day | ORAL | 3 refills | Status: DC
Start: 2020-06-24 — End: 2020-07-10

## 2020-06-24 NOTE — Telephone Encounter (Signed)
rx sent to pharmacy

## 2020-06-24 NOTE — Telephone Encounter (Signed)
*  STAT* If patient is at the pharmacy, call can be transferred to refill team.   1. Which medications need to be refilled? (please list name of each medication and dose if known) Metoprolol  2. Which pharmacy/location (including street and city if local pharmacy) is medication to be sent to? [facility], [place]  3. Do they need a 30 day or 90 day supply? 180 and refills

## 2020-06-24 NOTE — Telephone Encounter (Signed)
Medication is no longer on patient's current medication list. Please advise.

## 2020-07-08 ENCOUNTER — Other Ambulatory Visit: Payer: Self-pay | Admitting: Adult Health

## 2020-07-10 ENCOUNTER — Other Ambulatory Visit: Payer: Self-pay

## 2020-07-10 ENCOUNTER — Telehealth: Payer: Self-pay | Admitting: Cardiovascular Disease

## 2020-07-10 MED ORDER — METOPROLOL SUCCINATE ER 50 MG PO TB24
50.0000 mg | ORAL_TABLET | Freq: Every day | ORAL | 3 refills | Status: DC
Start: 2020-07-10 — End: 2020-07-10

## 2020-07-10 MED ORDER — METOPROLOL SUCCINATE ER 50 MG PO TB24
150.0000 mg | ORAL_TABLET | Freq: Every day | ORAL | 3 refills | Status: DC
Start: 2020-07-10 — End: 2020-11-03

## 2020-07-10 NOTE — Telephone Encounter (Signed)
Verified with Dr. Kennon Holter nurse Rexanne Mano that patient should be taking 150mg  of metoprolol succinate daily. Prescription updated and sent to pharmacy. Patient notified and verbalized understanding.

## 2020-07-10 NOTE — Telephone Encounter (Signed)
Pt c/o medication issue:  1. Name of Medication: metoprolol succinate (TOPROL-XL) 50 MG 24 hr tablet  2. How are you currently taking this medication (dosage and times per day)? 1 tablet by mouth daily as of yesterday  3. Are you having a reaction (difficulty breathing--STAT)? No   4. What is your medication issue? Dennis [name] is calling requesting a callback from a nurse to discuss why the decrease from 3 tablets of 50 MG's daily to 1 tablet of 50 MG's was made. Please advise.

## 2020-07-20 ENCOUNTER — Other Ambulatory Visit: Payer: Self-pay | Admitting: Adult Health

## 2020-07-21 ENCOUNTER — Other Ambulatory Visit: Payer: Self-pay | Admitting: Adult Health

## 2020-08-04 DIAGNOSIS — L570 Actinic keratosis: Secondary | ICD-10-CM | POA: Diagnosis not present

## 2020-08-04 DIAGNOSIS — Z85828 Personal history of other malignant neoplasm of skin: Secondary | ICD-10-CM | POA: Diagnosis not present

## 2020-08-18 DIAGNOSIS — I2510 Atherosclerotic heart disease of native coronary artery without angina pectoris: Secondary | ICD-10-CM | POA: Diagnosis not present

## 2020-08-18 DIAGNOSIS — R7309 Other abnormal glucose: Secondary | ICD-10-CM | POA: Diagnosis not present

## 2020-08-18 DIAGNOSIS — E782 Mixed hyperlipidemia: Secondary | ICD-10-CM | POA: Diagnosis not present

## 2020-08-18 DIAGNOSIS — I129 Hypertensive chronic kidney disease with stage 1 through stage 4 chronic kidney disease, or unspecified chronic kidney disease: Secondary | ICD-10-CM | POA: Diagnosis not present

## 2020-08-25 DIAGNOSIS — R7309 Other abnormal glucose: Secondary | ICD-10-CM | POA: Diagnosis not present

## 2020-08-25 DIAGNOSIS — N1831 Chronic kidney disease, stage 3a: Secondary | ICD-10-CM | POA: Diagnosis not present

## 2020-08-25 DIAGNOSIS — I129 Hypertensive chronic kidney disease with stage 1 through stage 4 chronic kidney disease, or unspecified chronic kidney disease: Secondary | ICD-10-CM | POA: Diagnosis not present

## 2020-09-24 DIAGNOSIS — I2510 Atherosclerotic heart disease of native coronary artery without angina pectoris: Secondary | ICD-10-CM | POA: Diagnosis not present

## 2020-09-24 DIAGNOSIS — I129 Hypertensive chronic kidney disease with stage 1 through stage 4 chronic kidney disease, or unspecified chronic kidney disease: Secondary | ICD-10-CM | POA: Diagnosis not present

## 2020-09-24 DIAGNOSIS — R7309 Other abnormal glucose: Secondary | ICD-10-CM | POA: Diagnosis not present

## 2020-09-24 DIAGNOSIS — E782 Mixed hyperlipidemia: Secondary | ICD-10-CM | POA: Diagnosis not present

## 2020-09-24 DIAGNOSIS — E039 Hypothyroidism, unspecified: Secondary | ICD-10-CM | POA: Diagnosis not present

## 2020-10-07 ENCOUNTER — Ambulatory Visit: Payer: Medicare HMO | Admitting: Cardiovascular Disease

## 2020-10-09 ENCOUNTER — Ambulatory Visit: Payer: Medicare HMO | Admitting: Cardiovascular Disease

## 2020-10-29 ENCOUNTER — Other Ambulatory Visit: Payer: Self-pay | Admitting: *Deleted

## 2020-10-29 DIAGNOSIS — I724 Aneurysm of artery of lower extremity: Secondary | ICD-10-CM

## 2020-11-03 ENCOUNTER — Ambulatory Visit: Payer: Medicare HMO | Admitting: Cardiovascular Disease

## 2020-11-03 ENCOUNTER — Other Ambulatory Visit: Payer: Self-pay

## 2020-11-03 ENCOUNTER — Encounter: Payer: Self-pay | Admitting: Cardiovascular Disease

## 2020-11-03 DIAGNOSIS — E785 Hyperlipidemia, unspecified: Secondary | ICD-10-CM

## 2020-11-03 DIAGNOSIS — I4510 Unspecified right bundle-branch block: Secondary | ICD-10-CM

## 2020-11-03 DIAGNOSIS — I2510 Atherosclerotic heart disease of native coronary artery without angina pectoris: Secondary | ICD-10-CM | POA: Diagnosis not present

## 2020-11-03 DIAGNOSIS — I129 Hypertensive chronic kidney disease with stage 1 through stage 4 chronic kidney disease, or unspecified chronic kidney disease: Secondary | ICD-10-CM | POA: Diagnosis not present

## 2020-11-03 DIAGNOSIS — I10 Essential (primary) hypertension: Secondary | ICD-10-CM

## 2020-11-03 DIAGNOSIS — I724 Aneurysm of artery of lower extremity: Secondary | ICD-10-CM | POA: Diagnosis not present

## 2020-11-03 DIAGNOSIS — Z9861 Coronary angioplasty status: Secondary | ICD-10-CM | POA: Diagnosis not present

## 2020-11-03 DIAGNOSIS — N1831 Chronic kidney disease, stage 3a: Secondary | ICD-10-CM | POA: Diagnosis not present

## 2020-11-03 DIAGNOSIS — E782 Mixed hyperlipidemia: Secondary | ICD-10-CM | POA: Diagnosis not present

## 2020-11-03 NOTE — Progress Notes (Signed)
11/03/2020 Dennis [name]   [birth year]  [id number]  Primary Physician Merrilee Seashore, MD Primary Cardiologist: Lorretta Harp MD [name], [place]  HPI:  Dennis [name] is a 75 y.o.  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 He is a patient of Dr.RamachandranI last saw him in the office 04/10/2020.Marland Kitchen History of hyperlipidemia and CAD status post RCA and circumflex intervention by Dr. Glade Lloyd back in 2002. He was admitted to [facility] 09/30/14 with unstable angina. He underwent cardiac catheterization that day by Dr. Claiborne Billings revealing a high-grade mid circumflex lesion on a bend and was stented 2 days later by Dr. Julianne Handler with a Promus premiere drug eluting stent postdilated with a 4.5 mm balloon.When I saw him 3 months ago he had experienced several episodes of nitrate responsive chest pain. I ultimately decided to perform outpatient cardiac catheterization. The right radial approach on 11/17/16 revealing a widely patent proximal circumflex stent, widely patent RCA with 70% lesion in the AV groove circumflex beyond the previously stented segment. I performed FFR which was 0.7 suggesting this was physiologically significant and stented that with a 3 mm x 12 mm long synergy drug-eluting stent post dilating up to 3.25 mm. This pain has since  He haddevelopedpain in his right leg with some swelling. 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 He ultimately had right above to below the knee bypass grafting by Dr. Sherren Mocha Early 11/30/2018 which he has still slowly healing from.  He went to the emergency room on 04/08/2020 with chest pain after raking leaves and back and then that day which is unusually strenuous activity for him.  His enzymes were negative and his EKG showed no acute changes.  He was seen by Dr.Nipp, cardiology fellow, who assessed  him and thought he was stable for discharge.  He did add amlodipine 2.5 mg a day.    Since I saw him in the office 6 months ago he continues to do well.  He had 1 episode of dizziness but no chest pain.  Current Meds  Medication Sig  . albuterol (VENTOLIN HFA) 108 (90 Base) MCG/ACT inhaler Inhale into the lungs as needed.  Marland Kitchen amLODipine (NORVASC) 2.5 MG tablet Take 2.5 mg by mouth daily.  Marland Kitchen amLODipine (NORVASC) 5 MG tablet Take 1 tablet by mouth daily.  Marland Kitchen ascorbic acid (VITAMIN C) 1000 MG tablet Take 1,000 mg by mouth daily.  Marland Kitchen aspirin 81 MG tablet Take 81 mg by mouth every evening.   Marland Kitchen b complex vitamins capsule Take 1 capsule by mouth daily.  . Cholecalciferol (VITAMIN D) 50 MCG (2000 UT) tablet Take 4,000 Units by mouth daily.  . clopidogrel (PLAVIX) 75 MG tablet Take 1 tablet by mouth once daily  . fenofibrate 160 MG tablet Take 160 mg by mouth daily.  Marland Kitchen ipratropium (ATROVENT) 0.03 % nasal spray Place into the nose as needed.  Marland Kitchen levothyroxine (SYNTHROID, LEVOTHROID) 112 MCG tablet Take 112 mcg by mouth daily before breakfast.   . magnesium oxide (MAG-OX) 400 MG tablet Take 400 mg by mouth daily.  . nitroGLYCERIN (NITROSTAT) 0.4 MG SL tablet DISSOLVE ONE TABLET UNDER THE TONGUE EVERY 5 MINUTES AS NEEDED FOR CHEST PAIN.  Marland Kitchen Omeprazole-Sodium Bicarbonate (ZEGERID) 20-1100 MG CAPS capsule Take 1 capsule by mouth daily.  . pravastatin (PRAVACHOL) 20 MG tablet Take 20 mg by mouth daily.  Marland Kitchen telmisartan (MICARDIS) 80 MG tablet  Take 80 mg by mouth daily.  Marland Kitchen tiZANidine (ZANAFLEX) 2 MG tablet Take 2 mg by mouth as needed.     No Known Allergies  Social History   Socioeconomic History  . Marital status: Married    Spouse name: Not on file  . Number of children: Not on file  . Years of education: Not on file  . Highest education level: Not on file  Occupational History  . Not on file  Tobacco Use  . Smoking status: Former Smoker    Packs/day: 1.00    Years: 40.00    Pack years: 40.00     Types: Cigarettes    Quit date: 03/06/2001    Years since quitting: 19.6  . Smokeless tobacco: Never Used  Vaping Use  . Vaping Use: Never used  Substance and Sexual Activity  . Alcohol use: Yes    Comment: occ  . Drug use: No  . Sexual activity: Not on file  Other Topics Concern  . Not on file  Social History Narrative  . Not on file   Social Determinants of Health   Financial Resource Strain: Not on file  Food Insecurity: Not on file  Transportation Needs: Not on file  Physical Activity: Not on file  Stress: Not on file  Social Connections: Not on file  Intimate Partner Violence: Not on file     Review of Systems: General: negative for chills, fever, night sweats or weight changes.  Cardiovascular: negative for chest pain, dyspnea on exertion, edema, orthopnea, palpitations, paroxysmal nocturnal dyspnea or shortness of breath Dermatological: negative for rash Respiratory: negative for cough or wheezing Urologic: negative for hematuria Abdominal: negative for nausea, vomiting, diarrhea, bright red blood per rectum, melena, or hematemesis Neurologic: negative for visual changes, syncope, or dizziness All other systems reviewed and are otherwise negative except as noted above.    Blood pressure (!) 152/82, pulse 65, height 5\' 8"  (1.727 m), weight 220 lb (99.8 kg), SpO2 95 %.  General appearance: alert and no distress Neck: no adenopathy, no carotid bruit, no JVD, supple, symmetrical, trachea midline and thyroid not enlarged, symmetric, no tenderness/mass/nodules Lungs: clear to auscultation bilaterally Heart: regular rate and rhythm, S1, S2 normal, no murmur, click, rub or gallop Extremities: extremities normal, atraumatic, no cyanosis or edema Pulses: 2+ and symmetric Skin: Skin color, texture, turgor normal. No rashes or lesions Neurologic: Alert and oriented X 3, normal strength and tone. Normal symmetric reflexes. Normal coordination and gait  EKG sinus rhythm at  65 with right bundle branch block and left axis deviation.  I personally reviewed this EKG.  ASSESSMENT AND PLAN:   Dyslipidemia History of dyslipidemia on statin therapy with lipid profile performed 08/18/2020 revealing total cholesterol of 156, LDL of 82 and HDL 29.  CAD S/P multiple PCIs History of CAD status post multiple interventions beginning back in 2002 by Dr. Glade Lloyd.  He had RCA and circumflex intervention at that time.  Dr. Angelena Form cathed him 09/30/2014 revealing a high-grade mid circumflex stenosis on a bend which was stented with a Promus Premier stent postdilated to 4.5 mm.  I performed cardiac catheterization on him radially 11/17/2016 revealing a widely patent circumflex stent, widely patent RCA stent with a 70% AV groove circumflex stenosis beyond the previously stented segment.  I performed FFR which was 0.70 suggesting this was significant and I restented him with a 3 mm x 12 mm long Synergy drug-eluting stent postdilated up to 3.25 mm.  He did have an episode of chest  pain in November of last year but since that has been relatively asymptomatic.  Essential hypertension History of essential hypertension blood pressure measured today 152/82.  He does check his blood pressure at home and usually it is much lower than this.  He is on low-dose amlodipine and Micardis.  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 Donnetta Hutching 11/30/2018.  He follows this by duplex ultrasound.      Lorretta Harp MD FACP,FACC,FAHA, [facility] 11/03/2020 11:01 AM

## 2020-11-03 NOTE — Assessment & Plan Note (Signed)
History of dyslipidemia on statin therapy with lipid profile performed 08/18/2020 revealing total cholesterol of 156, LDL of 82 and HDL 29.

## 2020-11-03 NOTE — Assessment & Plan Note (Signed)
History of CAD status post multiple interventions beginning back in 2002 by Dr. Glade Lloyd.  He had RCA and circumflex intervention at that time.  Dr. Angelena Form cathed him 09/30/2014 revealing a high-grade mid circumflex stenosis on a bend which was stented with a Promus Premier stent postdilated to 4.5 mm.  I performed cardiac catheterization on him radially 11/17/2016 revealing a widely patent circumflex stent, widely patent RCA stent with a 70% AV groove circumflex stenosis beyond the previously stented segment.  I performed FFR which was 0.70 suggesting this was significant and I restented him with a 3 mm x 12 mm long Synergy drug-eluting stent postdilated up to 3.25 mm.  He did have an episode of chest pain in November of last year but since that has been relatively asymptomatic.

## 2020-11-03 NOTE — Assessment & Plan Note (Signed)
Chronic. 

## 2020-11-03 NOTE — Assessment & Plan Note (Signed)
History of right popliteal artery aneurysm status post right above to below the knee bypass grafting by Dr. Donnetta Hutching 11/30/2018.  He follows this by duplex ultrasound.

## 2020-11-03 NOTE — Patient Instructions (Signed)

## 2020-11-03 NOTE — Assessment & Plan Note (Signed)
History of essential hypertension blood pressure measured today 152/82.  He does check his blood pressure at home and usually it is much lower than this.  He is on low-dose amlodipine and Micardis.

## 2020-11-26 DIAGNOSIS — I129 Hypertensive chronic kidney disease with stage 1 through stage 4 chronic kidney disease, or unspecified chronic kidney disease: Secondary | ICD-10-CM | POA: Diagnosis not present

## 2020-11-26 DIAGNOSIS — R7309 Other abnormal glucose: Secondary | ICD-10-CM | POA: Diagnosis not present

## 2020-11-26 DIAGNOSIS — E785 Hyperlipidemia, unspecified: Secondary | ICD-10-CM | POA: Diagnosis not present

## 2020-11-27 ENCOUNTER — Other Ambulatory Visit: Payer: Self-pay

## 2020-11-27 DIAGNOSIS — N1831 Chronic kidney disease, stage 3a: Secondary | ICD-10-CM

## 2020-11-27 DIAGNOSIS — R7303 Prediabetes: Secondary | ICD-10-CM | POA: Insufficient documentation

## 2020-11-27 DIAGNOSIS — I724 Aneurysm of artery of lower extremity: Secondary | ICD-10-CM

## 2020-11-27 DIAGNOSIS — R600 Localized edema: Secondary | ICD-10-CM | POA: Insufficient documentation

## 2020-11-27 DIAGNOSIS — N529 Male erectile dysfunction, unspecified: Secondary | ICD-10-CM

## 2020-11-27 DIAGNOSIS — I129 Hypertensive chronic kidney disease with stage 1 through stage 4 chronic kidney disease, or unspecified chronic kidney disease: Secondary | ICD-10-CM | POA: Insufficient documentation

## 2020-11-27 DIAGNOSIS — Z720 Tobacco use: Secondary | ICD-10-CM | POA: Insufficient documentation

## 2020-11-27 DIAGNOSIS — K219 Gastro-esophageal reflux disease without esophagitis: Secondary | ICD-10-CM

## 2020-11-27 DIAGNOSIS — E559 Vitamin D deficiency, unspecified: Secondary | ICD-10-CM

## 2020-11-27 DIAGNOSIS — Z0000 Encounter for general adult medical examination without abnormal findings: Secondary | ICD-10-CM | POA: Insufficient documentation

## 2020-11-27 DIAGNOSIS — R972 Elevated prostate specific antigen [PSA]: Secondary | ICD-10-CM

## 2020-11-27 DIAGNOSIS — I2510 Atherosclerotic heart disease of native coronary artery without angina pectoris: Secondary | ICD-10-CM | POA: Insufficient documentation

## 2020-11-27 DIAGNOSIS — E785 Hyperlipidemia, unspecified: Secondary | ICD-10-CM | POA: Insufficient documentation

## 2020-11-27 HISTORY — DX: Male erectile dysfunction, unspecified: N52.9

## 2020-11-27 HISTORY — DX: Localized edema: R60.0

## 2020-11-27 HISTORY — DX: Prediabetes: R73.03

## 2020-11-27 HISTORY — DX: Vitamin D deficiency, unspecified: E55.9

## 2020-11-27 HISTORY — DX: Elevated prostate specific antigen (PSA): R97.20

## 2020-11-27 HISTORY — DX: Gastro-esophageal reflux disease without esophagitis: K21.9

## 2020-11-27 HISTORY — DX: Hypomagnesemia: E83.42

## 2020-11-27 HISTORY — DX: Chronic kidney disease, stage 3a: N18.31

## 2020-11-30 ENCOUNTER — Ambulatory Visit (INDEPENDENT_AMBULATORY_CARE_PROVIDER_SITE_OTHER)
Admission: RE | Admit: 2020-11-30 | Discharge: 2020-11-30 | Disposition: A | Discharge status: Home / Self Care | Payer: Medicare HMO | Source: Ambulatory Visit | Attending: Vascular Surgery | Admitting: Vascular Surgery

## 2020-11-30 ENCOUNTER — Ambulatory Visit: Payer: Medicare HMO | Admitting: Physician Assistant

## 2020-11-30 ENCOUNTER — Other Ambulatory Visit: Payer: Self-pay

## 2020-11-30 ENCOUNTER — Ambulatory Visit (HOSPITAL_COMMUNITY)
Admission: RE | Admit: 2020-11-30 | Discharge: 2020-11-30 | Disposition: A | Discharge status: Home / Self Care | Payer: Medicare HMO | Source: Ambulatory Visit | Attending: Surgery | Admitting: Surgery

## 2020-11-30 VITALS — BP 152/87 | HR 67 | Temp 98.6°F | Resp 20 | Ht 68.0 in | Wt 220.5 lb

## 2020-11-30 DIAGNOSIS — I724 Aneurysm of artery of lower extremity: Secondary | ICD-10-CM

## 2020-11-30 NOTE — Progress Notes (Signed)
Office Note     CC:  follow up Requesting Provider:  Merrilee Seashore, MD  HPI: Dennis [name] is a 75 y.o. (05-Dec-1945) male who presents for follow up of right popliteal artery aneurysm. He is status post right above knee to below knee vein bypass on 11/30/18 by Dr.Early for 4.6 cm popliteal aneurysm. He has done well post operatively. He was last seen in June of 2021 by Dr. Donnetta Hutching at which time he was having some continued sensory loss at his right knee. This was not activity limiting.  His duplex showed patent bypass graft.  Today he reports no claudication symptoms, rest pain or tissue loss. He has a little area of residual numbness in the right leg. This does not cause any pain. He does have bilateral lower extremity swelling. He also says this does not cause him any discomfort. It improves some with elevation. He has some compression stockings but does not wear them. He reports that he recently was diagnosed with kidney disease. His Nephrologist and heart doctor have both made some medication adjustments and he thinks this is likely why he is having increased swelling.  The pt is on a statin for cholesterol management.  The pt is on a daily aspirin.   Other AC:  Plavix The pt is on CCB, ARB for hypertension.   The pt  diabetic.   Tobacco hx:  quit, 2002  Past Medical History:  Diagnosis Date   Anginal pain ([place])    denied chest pain 11/28/18   CAD (coronary artery disease)    a. 2002 Stent RCA & circumflex EF40-50%    GERD (gastroesophageal reflux disease)    Hyperlipidemia    Hypertension    Hypothyroidism    Myocardial infarction ([place]) 03/2001   Squamous cell carcinoma of scalp    "some burned, some cut off" (11/17/2016)   Thyroid nodule, hot    s/p radioactive iodine, now hypothyroidism    Past Surgical History:  Procedure Laterality Date   BYPASS GRAFT POPLITEAL TO POPLITEAL Right 11/30/2018   Procedure: RIGHT POPLITEAL ARTERY ANEURYSM REPAIR WITH ABOVE KNEE  POPLITEAL TO BELOW KNEE POPLITEAL BY PASS GRAFT USING [place];  Surgeon: Rosetta Posner, MD;  Location: [facility];  Service: Vascular;  Laterality: Right;   CARDIAC CATHETERIZATION  09/2014   "day before they put the stent in"   [place]  03/2001; 09/2014; 11/17/2016   "1+1+1"   CORONARY STENT INTERVENTION N/A 11/17/2016   Procedure: Coronary Stent Intervention;  Surgeon: Lorretta Harp, MD;  Location: [place] [facility];  Service: Cardiovascular;  Laterality: N/A;   INTRAVASCULAR PRESSURE WIRE/FFR STUDY N/A 11/17/2016   Procedure: Intravascular Pressure Wire/FFR Study;  Surgeon: Lorretta Harp, MD;  Location: [place] [facility];  Service: Cardiovascular;  Laterality: N/A;   LEFT HEART CATH AND CORONARY ANGIOGRAPHY N/A 11/17/2016   Procedure: Left Heart Cath and Coronary Angiography;  Surgeon: Lorretta Harp, MD;  Location: [place] [facility];  Service: Cardiovascular;  Laterality: N/A;   LEFT HEART CATHETERIZATION WITH CORONARY ANGIOGRAM N/A 09/30/2014   Procedure: LEFT HEART CATHETERIZATION WITH CORONARY ANGIOGRAM;  Surgeon: Troy Sine, MD;  Location: [facility] CATH LAB;  Service: Cardiovascular;  Laterality: N/A;   PERCUTANEOUS CORONARY STENT INTERVENTION (PCI-S) N/A 10/02/2014   Procedure: PERCUTANEOUS CORONARY STENT INTERVENTION (PCI-S);  Surgeon: Burnell Blanks, MD;  Location: [facility] CATH LAB;  Service: Cardiovascular;  Laterality: N/A;   SQUAMOUS CELL CARCINOMA EXCISION     "scalp"  WISDOM TOOTH EXTRACTION      Social History   Socioeconomic History   Marital status: Married    Spouse name: Not on file   Number of children: Not on file   Years of education: Not on file   Highest education level: Not on file  Occupational History   Not on file  Tobacco Use   Smoking status: Former    Packs/day: 1.00    Years: 40.00    Pack years: 40.00    Types: Cigarettes    Quit date: 03/06/2001    Years since quitting: 19.7   Smokeless tobacco:  Never  Vaping Use   Vaping Use: Never used  Substance and Sexual Activity   Alcohol use: Yes    Comment: occ   Drug use: No   Sexual activity: Not on file  Other Topics Concern   Not on file  Social History Narrative   Not on file   Social Determinants of Health   Financial Resource Strain: Not on file  Food Insecurity: Not on file  Transportation Needs: Not on file  Physical Activity: Not on file  Stress: Not on file  Social Connections: Not on file  Intimate Partner Violence: Not on file    Family History  Family history unknown: Yes    Current Outpatient Medications  Medication Sig Dispense Refill   albuterol (VENTOLIN HFA) 108 (90 Base) MCG/ACT inhaler Inhale into the lungs as needed.     amLODipine (NORVASC) 2.5 MG tablet Take 2.5 mg by mouth daily.     amLODipine (NORVASC) 5 MG tablet Take 1 tablet by mouth daily.     ascorbic acid (VITAMIN C) 1000 MG tablet Take 1,000 mg by mouth daily.     aspirin 81 MG tablet Take 81 mg by mouth every evening.      b complex vitamins capsule Take 1 capsule by mouth daily.     Cholecalciferol (VITAMIN D) 50 MCG (2000 UT) tablet Take 4,000 Units by mouth daily.     clopidogrel (PLAVIX) 75 MG tablet Take 1 tablet by mouth once daily 90 tablet 0   fenofibrate 160 MG tablet Take 160 mg by mouth daily.     ipratropium (ATROVENT) 0.03 % nasal spray Place into the nose as needed.     levothyroxine (SYNTHROID, LEVOTHROID) 112 MCG tablet Take 112 mcg by mouth daily before breakfast.      magnesium oxide (MAG-OX) 400 MG tablet Take 400 mg by mouth daily.     nitroGLYCERIN (NITROSTAT) 0.4 MG SL tablet DISSOLVE ONE TABLET UNDER THE TONGUE EVERY 5 MINUTES AS NEEDED FOR CHEST PAIN. 25 tablet 3   Omeprazole-Sodium Bicarbonate (ZEGERID) 20-1100 MG CAPS capsule Take 1 capsule by mouth daily.     pravastatin (PRAVACHOL) 20 MG tablet Take 20 mg by mouth daily.     telmisartan (MICARDIS) 80 MG tablet Take 80 mg by mouth daily.     tiZANidine  (ZANAFLEX) 2 MG tablet Take 2 mg by mouth as needed.     No current facility-administered medications for this visit.    Allergies  Allergen Reactions   Sulfacetamide Sodium     Other reaction(s): Unknown     REVIEW OF SYSTEMS:  [X]  denotes positive finding, [ ]  denotes negative finding Cardiac  Comments:  Chest pain or chest pressure:    Shortness of breath upon exertion:    Short of breath when lying flat:    Irregular heart rhythm:        Vascular  Pain in calf, thigh, or hip brought on by ambulation:    Pain in feet at night that wakes you up from your sleep:     Blood clot in your veins:    Leg swelling:  X       Pulmonary    Oxygen at home:    Productive cough:     Wheezing:         Neurologic    Sudden weakness in arms or legs:     Sudden numbness in arms or legs:     Sudden onset of difficulty speaking or slurred speech:    Temporary loss of vision in one eye:     Problems with dizziness:         Gastrointestinal    Blood in stool:     Vomited blood:         Genitourinary    Burning when urinating:     Blood in urine:        Psychiatric    Major depression:         Hematologic    Bleeding problems:    Problems with blood clotting too easily:        Skin    Rashes or ulcers:        Constitutional    Fever or chills:      PHYSICAL EXAMINATION:  Vitals:   11/30/20 1130  BP: (!) 152/87  Pulse: 67  Resp: 20  Temp: 98.6 F (37 C)  TempSrc: Temporal  SpO2: 95%  Weight: 220 lb 8 oz (100 kg)  Height: 5\' 8"  (1.727 m)    General:  WDWN in NAD; vital signs documented above Gait: Normal HENT: WNL, normocephalic Pulmonary: normal non-labored breathing , without wheezing Cardiac: regular HR, without  Murmurs without carotid bruit Abdomen: soft, NT, no masses. No palpable abdominal aortic pulse Vascular Exam/Pulses:  Right Left  Radial 2+ (normal) 2+ (normal)  Femoral 2+ (normal) 2+ (normal)  Popliteal Not palpable Not palpable  DP 2+  (normal) 2+ (normal)  PT 2+ (normal) 2+ (normal)   Extremities: without ischemic changes, without Gangrene , without cellulitis; without open wounds;  Musculoskeletal: no muscle wasting or atrophy  Neurologic: A&O X 3;  No focal weakness or paresthesias are detected Psychiatric:  The pt has Normal affect.   Non-Invasive Vascular Imaging:  11/30/20  +--------------+-------+-----------+---------+--------+-----+--------+  Left PoplitealAP (cm)Transv (cm)Waveform StenosisShapeComments  +--------------+-------+-----------+---------+--------+-----+--------+  Mid           1.60   1.77       triphasic                       +--------------+-------+-----------+---------+--------+-----+--------+      Right Graft #1: Above-knee to below-knee popliteal  +------------------+--------+---------------+--------+--------+                    PSV cm/sStenosis       WaveformComments  +------------------+--------+---------------+--------+--------+  Inflow            83                     biphasic          +------------------+--------+---------------+--------+--------+  Prox Anastomosis  64                     biphasic          +------------------+--------+---------------+--------+--------+  Proximal Graft    234     50-70% stenosisbiphasic          +------------------+--------+---------------+--------+--------+  Mid Graft         147                    biphasic          +------------------+--------+---------------+--------+--------+  Distal Graft      124                    biphasic          +------------------+--------+---------------+--------+--------+  Distal Anastomosis135                    biphasic          +------------------+--------+---------------+--------+--------+  Outflow           108                    biphasic          +------------------+--------+---------------+--------+--------+    ASSESSMENT/PLAN:: 75 y.o. male here for  follow up for follow up of right popliteal artery aneurysm. He is status post right above knee to below knee vein bypass on 11/30/18 by Dr.Early for 4.6 cm popliteal aneurysm. He does not have any lower extremity claudication, rest pain or tissue loss.  -ABI is normal bilaterally 1.01 on the right, 1.17 on left. Duplex shows no left popliteal aneurysm. Right lower extremity bypass is patent. There is 50-70% stenosis  in the proximal graft but this has not changed from prior studies.  - he will follow up in 1 year with repeat ABI and RLE bypass graft duplex  Karoline Caldwell, PA-C [facility] [phone]  Clinic MD:   Trula Slade

## 2020-12-03 DIAGNOSIS — I2510 Atherosclerotic heart disease of native coronary artery without angina pectoris: Secondary | ICD-10-CM | POA: Diagnosis not present

## 2020-12-03 DIAGNOSIS — R7309 Other abnormal glucose: Secondary | ICD-10-CM | POA: Diagnosis not present

## 2020-12-03 DIAGNOSIS — E039 Hypothyroidism, unspecified: Secondary | ICD-10-CM | POA: Diagnosis not present

## 2020-12-03 DIAGNOSIS — I129 Hypertensive chronic kidney disease with stage 1 through stage 4 chronic kidney disease, or unspecified chronic kidney disease: Secondary | ICD-10-CM | POA: Diagnosis not present

## 2020-12-03 DIAGNOSIS — E782 Mixed hyperlipidemia: Secondary | ICD-10-CM | POA: Diagnosis not present

## 2020-12-03 DIAGNOSIS — N1831 Chronic kidney disease, stage 3a: Secondary | ICD-10-CM | POA: Diagnosis not present

## 2021-01-03 DIAGNOSIS — N1831 Chronic kidney disease, stage 3a: Secondary | ICD-10-CM | POA: Diagnosis not present

## 2021-01-03 DIAGNOSIS — I2510 Atherosclerotic heart disease of native coronary artery without angina pectoris: Secondary | ICD-10-CM | POA: Diagnosis not present

## 2021-01-03 DIAGNOSIS — I129 Hypertensive chronic kidney disease with stage 1 through stage 4 chronic kidney disease, or unspecified chronic kidney disease: Secondary | ICD-10-CM | POA: Diagnosis not present

## 2021-01-03 DIAGNOSIS — E782 Mixed hyperlipidemia: Secondary | ICD-10-CM | POA: Diagnosis not present

## 2021-01-25 ENCOUNTER — Other Ambulatory Visit: Payer: Self-pay | Admitting: Cardiovascular Disease

## 2021-02-03 DIAGNOSIS — E782 Mixed hyperlipidemia: Secondary | ICD-10-CM | POA: Diagnosis not present

## 2021-02-03 DIAGNOSIS — I2510 Atherosclerotic heart disease of native coronary artery without angina pectoris: Secondary | ICD-10-CM | POA: Diagnosis not present

## 2021-02-03 DIAGNOSIS — N1831 Chronic kidney disease, stage 3a: Secondary | ICD-10-CM | POA: Diagnosis not present

## 2021-02-03 DIAGNOSIS — I129 Hypertensive chronic kidney disease with stage 1 through stage 4 chronic kidney disease, or unspecified chronic kidney disease: Secondary | ICD-10-CM | POA: Diagnosis not present

## 2021-02-04 DIAGNOSIS — C44319 Basal cell carcinoma of skin of other parts of face: Secondary | ICD-10-CM | POA: Diagnosis not present

## 2021-02-04 DIAGNOSIS — D485 Neoplasm of uncertain behavior of skin: Secondary | ICD-10-CM | POA: Diagnosis not present

## 2021-02-04 DIAGNOSIS — L821 Other seborrheic keratosis: Secondary | ICD-10-CM | POA: Diagnosis not present

## 2021-02-04 DIAGNOSIS — L570 Actinic keratosis: Secondary | ICD-10-CM | POA: Diagnosis not present

## 2021-04-07 DIAGNOSIS — I2510 Atherosclerotic heart disease of native coronary artery without angina pectoris: Secondary | ICD-10-CM | POA: Diagnosis not present

## 2021-04-07 DIAGNOSIS — E782 Mixed hyperlipidemia: Secondary | ICD-10-CM | POA: Diagnosis not present

## 2021-04-07 DIAGNOSIS — R7309 Other abnormal glucose: Secondary | ICD-10-CM | POA: Diagnosis not present

## 2021-04-07 DIAGNOSIS — I129 Hypertensive chronic kidney disease with stage 1 through stage 4 chronic kidney disease, or unspecified chronic kidney disease: Secondary | ICD-10-CM | POA: Diagnosis not present

## 2021-04-07 DIAGNOSIS — N1831 Chronic kidney disease, stage 3a: Secondary | ICD-10-CM | POA: Diagnosis not present

## 2021-04-08 DIAGNOSIS — Z0000 Encounter for general adult medical examination without abnormal findings: Secondary | ICD-10-CM | POA: Diagnosis not present

## 2021-04-08 DIAGNOSIS — R609 Edema, unspecified: Secondary | ICD-10-CM | POA: Diagnosis not present

## 2021-04-08 DIAGNOSIS — R69 Illness, unspecified: Secondary | ICD-10-CM | POA: Diagnosis not present

## 2021-04-08 DIAGNOSIS — F03B0 Unspecified dementia, moderate, without behavioral disturbance, psychotic disturbance, mood disturbance, and anxiety: Secondary | ICD-10-CM | POA: Diagnosis not present

## 2021-04-14 DIAGNOSIS — R7309 Other abnormal glucose: Secondary | ICD-10-CM | POA: Diagnosis not present

## 2021-04-14 DIAGNOSIS — I129 Hypertensive chronic kidney disease with stage 1 through stage 4 chronic kidney disease, or unspecified chronic kidney disease: Secondary | ICD-10-CM | POA: Diagnosis not present

## 2021-04-14 DIAGNOSIS — R69 Illness, unspecified: Secondary | ICD-10-CM | POA: Diagnosis not present

## 2021-04-14 DIAGNOSIS — N1831 Chronic kidney disease, stage 3a: Secondary | ICD-10-CM | POA: Diagnosis not present

## 2021-04-14 DIAGNOSIS — R600 Localized edema: Secondary | ICD-10-CM | POA: Diagnosis not present

## 2021-04-14 DIAGNOSIS — E782 Mixed hyperlipidemia: Secondary | ICD-10-CM | POA: Diagnosis not present

## 2021-04-14 DIAGNOSIS — Z0000 Encounter for general adult medical examination without abnormal findings: Secondary | ICD-10-CM | POA: Diagnosis not present

## 2021-04-26 ENCOUNTER — Other Ambulatory Visit: Payer: Self-pay | Admitting: Cardiovascular Disease

## 2021-05-11 DIAGNOSIS — R69 Illness, unspecified: Secondary | ICD-10-CM | POA: Diagnosis not present

## 2021-05-11 DIAGNOSIS — F03B0 Unspecified dementia, moderate, without behavioral disturbance, psychotic disturbance, mood disturbance, and anxiety: Secondary | ICD-10-CM | POA: Diagnosis not present

## 2021-05-11 DIAGNOSIS — R7309 Other abnormal glucose: Secondary | ICD-10-CM | POA: Diagnosis not present

## 2021-05-11 DIAGNOSIS — E782 Mixed hyperlipidemia: Secondary | ICD-10-CM | POA: Diagnosis not present

## 2021-05-11 DIAGNOSIS — R600 Localized edema: Secondary | ICD-10-CM | POA: Diagnosis not present

## 2021-05-11 DIAGNOSIS — N1831 Chronic kidney disease, stage 3a: Secondary | ICD-10-CM | POA: Diagnosis not present

## 2021-07-26 ENCOUNTER — Other Ambulatory Visit: Payer: Self-pay | Admitting: Cardiovascular Disease

## 2021-07-28 DIAGNOSIS — R69 Illness, unspecified: Secondary | ICD-10-CM | POA: Diagnosis not present

## 2021-07-28 DIAGNOSIS — N1831 Chronic kidney disease, stage 3a: Secondary | ICD-10-CM | POA: Diagnosis not present

## 2021-07-28 DIAGNOSIS — R7309 Other abnormal glucose: Secondary | ICD-10-CM | POA: Diagnosis not present

## 2021-07-28 DIAGNOSIS — E782 Mixed hyperlipidemia: Secondary | ICD-10-CM | POA: Diagnosis not present

## 2021-07-28 DIAGNOSIS — F03B0 Unspecified dementia, moderate, without behavioral disturbance, psychotic disturbance, mood disturbance, and anxiety: Secondary | ICD-10-CM | POA: Diagnosis not present

## 2021-08-02 ENCOUNTER — Telehealth: Payer: Self-pay | Admitting: Cardiovascular Disease

## 2021-08-02 NOTE — Telephone Encounter (Signed)
Left message for pt to call back  °

## 2021-08-02 NOTE — Telephone Encounter (Signed)
°  Patient states he tried to get his Metoprolol filled but was denied. I do not see this on his current medication list. Patient is asking if he is supposed to be taking it or not. Please advise.

## 2021-08-02 NOTE — Telephone Encounter (Addendum)
Patient states he is not sure of the metoprolol succinate dosage he should be taking. He states he was told to take 150mg  3 times a day and then was told to take 50 mg daily. Please advise.

## 2021-08-03 ENCOUNTER — Telehealth: Payer: Self-pay | Admitting: Cardiovascular Disease

## 2021-08-03 DIAGNOSIS — F03B0 Unspecified dementia, moderate, without behavioral disturbance, psychotic disturbance, mood disturbance, and anxiety: Secondary | ICD-10-CM | POA: Diagnosis not present

## 2021-08-03 DIAGNOSIS — E782 Mixed hyperlipidemia: Secondary | ICD-10-CM | POA: Diagnosis not present

## 2021-08-03 DIAGNOSIS — N1831 Chronic kidney disease, stage 3a: Secondary | ICD-10-CM | POA: Diagnosis not present

## 2021-08-03 DIAGNOSIS — R7309 Other abnormal glucose: Secondary | ICD-10-CM | POA: Diagnosis not present

## 2021-08-03 DIAGNOSIS — R69 Illness, unspecified: Secondary | ICD-10-CM | POA: Diagnosis not present

## 2021-08-03 DIAGNOSIS — Z23 Encounter for immunization: Secondary | ICD-10-CM | POA: Diagnosis not present

## 2021-08-03 MED ORDER — METOPROLOL SUCCINATE ER 50 MG PO TB24: 50.0000 mg | ORAL_TABLET | Freq: Every day | ORAL | 3 refills | Status: DC

## 2021-08-03 MED ORDER — METOPROLOL SUCCINATE ER 25 MG PO TB24: 25.0000 mg | ORAL_TABLET | Freq: Every day | ORAL | 3 refills | Status: DC

## 2021-08-03 NOTE — Telephone Encounter (Signed)
Patient is returning phone call.  °

## 2021-08-03 NOTE — Telephone Encounter (Signed)
Called patient back, he states that he has been taking Metoprolol Succinate 50 mg once daily. BP are well controlled, no complaints or concerns. He just needs a refill of this.  Advised to stay on what he has been taking since BP are controlled. Verified pharmacy.   Patient states they will keep appointment in May.   No other questions/concerns.

## 2021-08-04 DIAGNOSIS — L570 Actinic keratosis: Secondary | ICD-10-CM | POA: Diagnosis not present

## 2021-08-31 ENCOUNTER — Other Ambulatory Visit: Payer: Self-pay

## 2021-08-31 ENCOUNTER — Ambulatory Visit: Payer: Medicare HMO | Admitting: Neurology

## 2021-08-31 ENCOUNTER — Encounter: Payer: Self-pay | Admitting: Neurology

## 2021-08-31 VITALS — BP 159/95 | HR 72 | Ht 68.0 in | Wt 215.5 lb

## 2021-08-31 DIAGNOSIS — G309 Alzheimer's disease, unspecified: Secondary | ICD-10-CM

## 2021-08-31 DIAGNOSIS — F03B0 Unspecified dementia, moderate, without behavioral disturbance, psychotic disturbance, mood disturbance, and anxiety: Secondary | ICD-10-CM | POA: Insufficient documentation

## 2021-08-31 DIAGNOSIS — F03918 Unspecified dementia, unspecified severity, with other behavioral disturbance: Secondary | ICD-10-CM | POA: Insufficient documentation

## 2021-08-31 HISTORY — DX: Unspecified dementia, unspecified severity, with other behavioral disturbance: F03.918

## 2021-08-31 MED ORDER — MEMANTINE HCL 10 MG PO TABS: 10.0000 mg | ORAL_TABLET | Freq: Two times a day (BID) | ORAL | 11 refills | Status: DC

## 2021-08-31 NOTE — Patient Instructions (Signed)
Increase Namenda to 10 mg twice daily  ?MRI Brain without contrast  ?Routine EEG  ?Dementia labs  ?Follow up in 1 year  ?
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
## 2021-09-01 ENCOUNTER — Telehealth: Payer: Self-pay | Admitting: Neurology

## 2021-09-01 LAB — DEMENTIA PANEL
Homocysteine: 14.4 umol/L (ref 0.0–19.2)
RPR Ser Ql: NONREACTIVE
TSH: 2.14 u[IU]/mL (ref 0.450–4.500)
Vitamin B-12: 339 pg/mL (ref 232–1245)

## 2021-09-01 NOTE — Telephone Encounter (Signed)
[employer] medicare order sent to [facility], they will obtain the auth and reach out to the patient to schedule.  

## 2021-09-02 ENCOUNTER — Other Ambulatory Visit: Payer: Self-pay | Admitting: Neurology

## 2021-09-02 ENCOUNTER — Telehealth: Payer: Self-pay | Admitting: Neurology

## 2021-09-02 DIAGNOSIS — G309 Alzheimer's disease, unspecified: Secondary | ICD-10-CM

## 2021-09-02 NOTE — Telephone Encounter (Signed)
[employer] medicare patient is scheduled at [facility] for 09/15/21. They will obtain the auth.  ?

## 2021-09-13 ENCOUNTER — Ambulatory Visit: Payer: Medicare HMO | Admitting: Neurology

## 2021-09-13 DIAGNOSIS — R410 Disorientation, unspecified: Secondary | ICD-10-CM

## 2021-09-13 DIAGNOSIS — G309 Alzheimer's disease, unspecified: Secondary | ICD-10-CM

## 2021-09-13 NOTE — Procedures (Signed)
? ? ?  History: ? ?76 year old man with history of dementia and confusion ? ?EEG classification: Awake and drowsy ? ?Description of the recording: The background rhythms of this recording consists of a mild slowing delta-theta rhythm. As the record progresses, the patient appears to remain in the waking state throughout the recording. Photic stimulation was performed, did not show any abnormalities. Hyperventilation was also performed, did not show any abnormalities. Toward the end of the recording, the patient enters the drowsy state with slight symmetric slowing seen. The patient never enters stage II sleep. No abnormal epileptiform discharges seen during this recording. There was no focal slowing. EKG monitor shows no evidence of cardiac rhythm abnormalities with a heart rate of 60. ? ?Abnormality: Mild diffuse slowing ? ?Impression: This is an abnormal EEG recording in the waking and drowsy state due to mild diffuse slowing. Diffuse slowing is non specific but consistent with a generalized brain dysfunction.  ? ? ?Alric Ran, MD ?[facility] ? ? ?

## 2021-09-15 ENCOUNTER — Other Ambulatory Visit: Payer: Medicare HMO

## 2021-09-19 ENCOUNTER — Ambulatory Visit
Admission: RE | Admit: 2021-09-19 | Discharge: 2021-09-19 | Disposition: A | Discharge status: Home / Self Care | Payer: Medicare HMO | Source: Ambulatory Visit | Attending: Neurology | Admitting: Neurology

## 2021-09-19 DIAGNOSIS — G309 Alzheimer's disease, unspecified: Secondary | ICD-10-CM | POA: Diagnosis not present

## 2021-09-21 ENCOUNTER — Encounter: Payer: Self-pay | Admitting: Neurology

## 2021-10-12 ENCOUNTER — Ambulatory Visit: Payer: Medicare HMO | Admitting: Cardiovascular Disease

## 2021-10-12 ENCOUNTER — Encounter: Payer: Self-pay | Admitting: Cardiovascular Disease

## 2021-10-12 DIAGNOSIS — Z9861 Coronary angioplasty status: Secondary | ICD-10-CM

## 2021-10-12 DIAGNOSIS — I724 Aneurysm of artery of lower extremity: Secondary | ICD-10-CM | POA: Diagnosis not present

## 2021-10-12 DIAGNOSIS — I2510 Atherosclerotic heart disease of native coronary artery without angina pectoris: Secondary | ICD-10-CM | POA: Diagnosis not present

## 2021-10-12 DIAGNOSIS — I10 Essential (primary) hypertension: Secondary | ICD-10-CM | POA: Diagnosis not present

## 2021-10-12 DIAGNOSIS — E785 Hyperlipidemia, unspecified: Secondary | ICD-10-CM

## 2021-10-12 DIAGNOSIS — I4510 Unspecified right bundle-branch block: Secondary | ICD-10-CM | POA: Diagnosis not present

## 2021-10-12 NOTE — Assessment & Plan Note (Signed)
History of dyslipidemia on pravastatin 20 mg with lipid profile performed 11/26/2020 revealing total cholesterol 152, LDL 82 and HDL 25. ?

## 2021-10-12 NOTE — Assessment & Plan Note (Signed)
History of CAD status post circumflex PCI and stenting by Dr. Angelena Form April 2016.  I catheterized him 01/17/2017 because of nitrate responsive chest pain revealing a widely patent proximal circumflex stent, widely patent RCA with a 70% lesion in his AV groove circumflex beyond the previously stented segment.  I performed FFR which was 0.7 suggesting this was physiologically significant and stented him with a 3 mm x 12 mm long Synergy drug-eluting stent postdilated to 3.25 mm.  He is done well since.  He denies chest pain or shortness of breath. ?

## 2021-10-12 NOTE — Progress Notes (Signed)
? ? ? ?10/12/2021 ?Dennis [name]   ?[birth year]  ?[id number] ? ?Primary Physician Merrilee Seashore, MD ?Primary Cardiologist: Lorretta Harp MD [name], [place] ? ?HPI:  Dennis [name] is a 76 y.o.   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 He is a patient of Dr. Ashby Dawes   I last saw him in the office 11/03/2020.Marland Kitchen  He is accompanied by his wife Dennis [name] today.  History of hyperlipidemia and CAD status post RCA and circumflex intervention by Dr. Glade Lloyd back in 2002. He was admitted to [facility] 09/30/14 with unstable angina. He underwent cardiac catheterization that day by Dr. Claiborne Billings revealing a high-grade mid circumflex lesion on a bend and was stented 2 days later by Dr. Julianne Handler  with a Promus premiere drug eluting stent postdilated with a 4.5 mm balloon. When I saw him 3 months ago he had experienced several episodes of nitrate responsive chest pain. I ultimately decided to perform outpatient cardiac catheterization. The right radial approach on 11/17/16 revealing a widely patent proximal circumflex stent, widely patent RCA with 70% lesion in the AV groove circumflex beyond the previously stented segment. I performed FFR which was 0.7 suggesting this was physiologically significant and stented that with a 3 mm x 12 mm long synergy drug-eluting stent post dilating up to 3.25 mm. This pain has since  ?  ?He had developed  pain in his right leg with some swelling.  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  He ultimately had right above to below the knee bypass grafting by Dr. Sherren Mocha Early 11/30/2018 which he has still slowly healing from.  ?  ?He went to the emergency room on 04/08/2020 with chest pain after raking leaves and back and then that day which is unusually strenuous activity for him.  His enzymes were negative and his EKG showed no acute changes.  He was seen  by Dr.Nipp, cardiology fellow, who assessed him and thought he was stable for discharge.  He did add amlodipine 2.5 mg a day.   ?  ?Since I saw him in the office a year ago he has done well from a heart point of view.  He was unfortunately recently diagnosed with mild dementia.  He also has urinary frequency suggesting a prostate issue.  He denies chest pain or shortness of breath. ? ? ?Current Meds  ?Medication Sig  ? amLODipine (NORVASC) 2.5 MG tablet Take 1 tablet by mouth daily.  ? ascorbic acid (VITAMIN C) 1000 MG tablet Take 1,000 mg by mouth daily.  ? ASPIRIN 81 PO Take 81 mg by mouth daily.  ? b complex vitamins capsule Take 1 capsule by mouth daily.  ? Cholecalciferol (VITAMIN D) 50 MCG (2000 UT) tablet Take 2,000 Units by mouth daily.  ? clopidogrel (PLAVIX) 75 MG tablet Take 75 mg by mouth daily.  ? fenofibrate 160 MG tablet Take 160 mg by mouth daily.  ? magnesium oxide (MAG-OX) 400 MG tablet Take 400 mg by mouth daily.  ? memantine (NAMENDA) 10 MG tablet Take 1 tablet (10 mg total) by mouth 2 (two) times daily.  ? metoprolol succinate (TOPROL-XL) 50 MG 24 hr tablet Take 1 tablet (50 mg total) by mouth daily. Take with or immediately following a meal.  ? Misc Natural Products (NEURIVA PO) Take 1 tablet by mouth daily.  ? nitroGLYCERIN (NITROSTAT) 0.4 MG SL tablet DISSOLVE ONE TABLET UNDER THE  TONGUE EVERY 5 MINUTES AS NEEDED FOR CHEST PAIN.  ? Omeprazole-Sodium Bicarbonate (ZEGERID) 20-1100 MG CAPS capsule Take 1 capsule by mouth daily before breakfast.  ? pravastatin (PRAVACHOL) 20 MG tablet Take 20 mg by mouth daily.  ? telmisartan (MICARDIS) 80 MG tablet Take 80 mg by mouth daily.  ?  ? ?Allergies  ?Allergen Reactions  ? Sulfacetamide Sodium   ?  Other reaction(s): Unknown  ? ? ?Social History  ? ?Socioeconomic History  ? Marital status: Married  ?  Spouse name: Not on file  ? Number of children: Not on file  ? Years of education: Not on file  ? Highest education level: Not on file  ?Occupational  History  ? Not on file  ?Tobacco Use  ? Smoking status: Former  ?  Packs/day: 1.00  ?  Years: 40.00  ?  Pack years: 40.00  ?  Types: Cigarettes  ?  Quit date: 03/06/2001  ?  Years since quitting: 20.6  ? Smokeless tobacco: Never  ?Vaping Use  ? Vaping Use: Never used  ?Substance and Sexual Activity  ? Alcohol use: Yes  ?  Comment: occ  ? Drug use: No  ? Sexual activity: Not on file  ?Other Topics Concern  ? Not on file  ?Social History Narrative  ? Not on file  ? ?Social Determinants of Health  ? ?Financial Resource Strain: Not on file  ?Food Insecurity: Not on file  ?Transportation Needs: Not on file  ?Physical Activity: Not on file  ?Stress: Not on file  ?Social Connections: Not on file  ?Intimate Partner Violence: Not on file  ?  ? ?Review of Systems: ?General: negative for chills, fever, night sweats or weight changes.  ?Cardiovascular: negative for chest pain, dyspnea on exertion, edema, orthopnea, palpitations, paroxysmal nocturnal dyspnea or shortness of breath ?Dermatological: negative for rash ?Respiratory: negative for cough or wheezing ?Urologic: negative for hematuria ?Abdominal: negative for nausea, vomiting, diarrhea, bright red blood per rectum, melena, or hematemesis ?Neurologic: negative for visual changes, syncope, or dizziness ?All other systems reviewed and are otherwise negative except as noted above. ? ? ? ?Blood pressure (!) 144/96, pulse 65, height '5\' 8"'$  (1.727 m), weight 219 lb (99.3 kg).  ?General appearance: alert and no distress ?Neck: no adenopathy, no carotid bruit, no JVD, supple, symmetrical, trachea midline, and thyroid not enlarged, symmetric, no tenderness/mass/nodules ?Lungs: clear to auscultation bilaterally ?Heart: regular rate and rhythm, S1, S2 normal, no murmur, click, rub or gallop ?Extremities: extremities normal, atraumatic, no cyanosis or edema ?Pulses: 2+ and symmetric ?Skin: Skin color, texture, turgor normal. No rashes or lesions ?Neurologic: Grossly normal ? ?EKG sinus  rhythm at 65 with right bundle branch block and left axis deviation.  Personally reviewed this EKG. ? ?ASSESSMENT AND PLAN:  ? ?Dyslipidemia ?History of dyslipidemia on pravastatin 20 mg with lipid profile performed 11/26/2020 revealing total cholesterol 152, LDL 82 and HDL 25. ? ?CAD S/P multiple PCIs ?History of CAD status post circumflex PCI and stenting by Dr. Angelena Form April 2016.  I catheterized him 01/17/2017 because of nitrate responsive chest pain revealing a widely patent proximal circumflex stent, widely patent RCA with a 70% lesion in his AV groove circumflex beyond the previously stented segment.  I performed FFR which was 0.7 suggesting this was physiologically significant and stented him with a 3 mm x 12 mm long Synergy drug-eluting stent postdilated to 3.25 mm.  He is done well since.  He denies chest pain or shortness of breath. ? ?Essential  hypertension ?History of essential hypertension blood pressure measured today at 144/96.  He is on low-dose amlodipine, metoprolol and Micardis. ? ?RBBB ?Chronic ? 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 Donnetta Hutching 11/30/2018 with right above to below the knee bypass grafting which they follow by ultrasound. ? ? ? ? ?Lorretta Harp MD FACP,FACC,FAHA, FSCAI ?10/12/2021 ?11:58 AM ?

## 2021-10-12 NOTE — Assessment & Plan Note (Signed)
Chronic. 

## 2021-10-12 NOTE — Assessment & Plan Note (Signed)
Surgically addressed by Dr. Donnetta Hutching 11/30/2018 with right above to below the knee bypass grafting which they follow by ultrasound. ?

## 2021-10-12 NOTE — Assessment & Plan Note (Signed)
History of essential hypertension blood pressure measured today at 144/96.  He is on low-dose amlodipine, metoprolol and Micardis. ?

## 2021-10-12 NOTE — Patient Instructions (Signed)

## 2021-11-23 DIAGNOSIS — R7309 Other abnormal glucose: Secondary | ICD-10-CM | POA: Diagnosis not present

## 2021-11-23 DIAGNOSIS — R69 Illness, unspecified: Secondary | ICD-10-CM | POA: Diagnosis not present

## 2021-11-23 DIAGNOSIS — E782 Mixed hyperlipidemia: Secondary | ICD-10-CM | POA: Diagnosis not present

## 2021-11-23 DIAGNOSIS — N1831 Chronic kidney disease, stage 3a: Secondary | ICD-10-CM | POA: Diagnosis not present

## 2021-11-23 DIAGNOSIS — F03B0 Unspecified dementia, moderate, without behavioral disturbance, psychotic disturbance, mood disturbance, and anxiety: Secondary | ICD-10-CM | POA: Diagnosis not present

## 2021-11-30 DIAGNOSIS — G309 Alzheimer's disease, unspecified: Secondary | ICD-10-CM | POA: Diagnosis not present

## 2021-11-30 DIAGNOSIS — R69 Illness, unspecified: Secondary | ICD-10-CM | POA: Diagnosis not present

## 2021-11-30 DIAGNOSIS — R7309 Other abnormal glucose: Secondary | ICD-10-CM | POA: Diagnosis not present

## 2021-11-30 DIAGNOSIS — E782 Mixed hyperlipidemia: Secondary | ICD-10-CM | POA: Diagnosis not present

## 2021-11-30 DIAGNOSIS — N1831 Chronic kidney disease, stage 3a: Secondary | ICD-10-CM | POA: Diagnosis not present

## 2021-12-01 ENCOUNTER — Other Ambulatory Visit: Payer: Self-pay | Admitting: *Deleted

## 2022-01-19 ENCOUNTER — Emergency Department (HOSPITAL_COMMUNITY): Payer: Medicare HMO

## 2022-01-19 ENCOUNTER — Other Ambulatory Visit: Payer: Self-pay

## 2022-01-19 ENCOUNTER — Emergency Department (HOSPITAL_COMMUNITY)
Admission: EM | Admit: 2022-01-19 | Discharge: 2022-01-20 | Discharge status: Against Medical Advice | Payer: Medicare HMO | Attending: Emergency Medicine | Admitting: Emergency Medicine

## 2022-01-19 DIAGNOSIS — R4701 Aphasia: Secondary | ICD-10-CM | POA: Insufficient documentation

## 2022-01-19 DIAGNOSIS — Z5321 Procedure and treatment not carried out due to patient leaving prior to being seen by health care provider: Secondary | ICD-10-CM | POA: Insufficient documentation

## 2022-01-19 DIAGNOSIS — Z8673 Personal history of transient ischemic attack (TIA), and cerebral infarction without residual deficits: Secondary | ICD-10-CM | POA: Diagnosis not present

## 2022-01-19 DIAGNOSIS — I639 Cerebral infarction, unspecified: Secondary | ICD-10-CM | POA: Diagnosis not present

## 2022-01-19 DIAGNOSIS — Z743 Need for continuous supervision: Secondary | ICD-10-CM | POA: Diagnosis not present

## 2022-01-19 DIAGNOSIS — R4182 Altered mental status, unspecified: Secondary | ICD-10-CM | POA: Diagnosis not present

## 2022-01-19 DIAGNOSIS — R509 Fever, unspecified: Secondary | ICD-10-CM | POA: Diagnosis not present

## 2022-01-19 DIAGNOSIS — I672 Cerebral atherosclerosis: Secondary | ICD-10-CM | POA: Diagnosis not present

## 2022-01-19 DIAGNOSIS — F0390 Unspecified dementia without behavioral disturbance: Secondary | ICD-10-CM | POA: Diagnosis not present

## 2022-01-19 DIAGNOSIS — R0902 Hypoxemia: Secondary | ICD-10-CM | POA: Diagnosis not present

## 2022-01-19 DIAGNOSIS — J320 Chronic maxillary sinusitis: Secondary | ICD-10-CM | POA: Diagnosis not present

## 2022-01-19 DIAGNOSIS — R410 Disorientation, unspecified: Secondary | ICD-10-CM | POA: Diagnosis not present

## 2022-01-19 DIAGNOSIS — R69 Illness, unspecified: Secondary | ICD-10-CM | POA: Diagnosis not present

## 2022-01-19 DIAGNOSIS — I6523 Occlusion and stenosis of bilateral carotid arteries: Secondary | ICD-10-CM | POA: Diagnosis not present

## 2022-01-19 LAB — COMPREHENSIVE METABOLIC PANEL
ALT: 19 U/L (ref 0–44)
AST: 23 U/L (ref 15–41)
Albumin: 4 g/dL (ref 3.5–5.0)
Alkaline Phosphatase: 38 U/L (ref 38–126)
Anion gap: 10 (ref 5–15)
BUN: 21 mg/dL (ref 8–23)
CO2: 22 mmol/L (ref 22–32)
Calcium: 9.3 mg/dL (ref 8.9–10.3)
Chloride: 107 mmol/L (ref 98–111)
Creatinine, Ser: 1.37 mg/dL — ABNORMAL HIGH (ref 0.61–1.24)
GFR, Estimated: 53 mL/min — ABNORMAL LOW (ref >60)
Glucose, Bld: 135 mg/dL — ABNORMAL HIGH (ref 70–99)
Potassium: 3.8 mmol/L (ref 3.5–5.1)
Sodium: 139 mmol/L (ref 135–145)
Total Bilirubin: 0.9 mg/dL (ref 0.3–1.2)
Total Protein: 7.2 g/dL (ref 6.5–8.1)

## 2022-01-19 LAB — ETHANOL: Alcohol, Ethyl (B): <10 mg/dL (ref <10)

## 2022-01-19 LAB — I-STAT CHEM 8, ED
BUN: 25 mg/dL — ABNORMAL HIGH (ref 8–23)
Calcium, Ion: 0.86 mmol/L — CL (ref 1.15–1.40)
Chloride: 111 mmol/L (ref 98–111)
Creatinine, Ser: 1.3 mg/dL — ABNORMAL HIGH (ref 0.61–1.24)
Glucose, Bld: 131 mg/dL — ABNORMAL HIGH (ref 70–99)
HCT: 45 % (ref 39.0–52.0)
Hemoglobin: 15.3 g/dL (ref 13.0–17.0)
Potassium: 4 mmol/L (ref 3.5–5.1)
Sodium: 137 mmol/L (ref 135–145)
TCO2: 19 mmol/L — ABNORMAL LOW (ref 22–32)

## 2022-01-19 LAB — DIFFERENTIAL
Abs Immature Granulocytes: 0.02 10*3/uL (ref 0.00–0.07)
Basophils Absolute: 0 10*3/uL (ref 0.0–0.1)
Basophils Relative: 0 %
Eosinophils Absolute: 0 10*3/uL (ref 0.0–0.5)
Eosinophils Relative: 1 %
Immature Granulocytes: 0 %
Lymphocytes Relative: 9 %
Lymphs Abs: 0.6 10*3/uL — ABNORMAL LOW (ref 0.7–4.0)
Monocytes Absolute: 0.6 10*3/uL (ref 0.1–1.0)
Monocytes Relative: 9 %
Neutro Abs: 5.4 10*3/uL (ref 1.7–7.7)
Neutrophils Relative %: 81 %

## 2022-01-19 LAB — URINALYSIS, ROUTINE W REFLEX MICROSCOPIC
Bacteria, UA: NONE SEEN
Bilirubin Urine: NEGATIVE
Glucose, UA: NEGATIVE mg/dL
Hgb urine dipstick: NEGATIVE
Ketones, ur: 5 mg/dL — AB
Leukocytes,Ua: NEGATIVE
Nitrite: NEGATIVE
Protein, ur: 30 mg/dL — AB
Specific Gravity, Urine: 1.026 (ref 1.005–1.030)
pH: 5 (ref 5.0–8.0)

## 2022-01-19 LAB — CBC
HCT: 44.2 % (ref 39.0–52.0)
Hemoglobin: 14.8 g/dL (ref 13.0–17.0)
MCH: 30.3 pg (ref 26.0–34.0)
MCHC: 33.5 g/dL (ref 30.0–36.0)
MCV: 90.4 fL (ref 80.0–100.0)
Platelets: 200 10*3/uL (ref 150–400)
RBC: 4.89 MIL/uL (ref 4.22–5.81)
RDW: 13.6 % (ref 11.5–15.5)
WBC: 6.7 10*3/uL (ref 4.0–10.5)
nRBC: 0 % (ref 0.0–0.2)

## 2022-01-19 LAB — CBG MONITORING, ED: Glucose-Capillary: 131 mg/dL — ABNORMAL HIGH (ref 70–99)

## 2022-01-19 LAB — PROTIME-INR
INR: 1.1 (ref 0.8–1.2)
Prothrombin Time: 14.3 seconds (ref 11.4–15.2)

## 2022-01-19 LAB — APTT: aPTT: 35 seconds (ref 24–36)

## 2022-01-19 MED ORDER — SODIUM CHLORIDE 0.9% FLUSH: 3.0000 mL | Freq: Once | INTRAVENOUS | Status: DC

## 2022-01-19 NOTE — ED Triage Notes (Signed)
Pt was BIB [facility] from home called out by wife due to concerns for difficulty speaking that occurred at 7:30 am and has since resolved. EMS negative stroke screen. HX TIA in the past and dementia. Wife in route

## 2022-01-19 NOTE — ED Notes (Signed)
Pt to go out to lobby with wife who was updated on poc.

## 2022-01-19 NOTE — ED Provider Triage Note (Signed)
Emergency Medicine Provider Triage Evaluation Note  Dennis [name] , a 76 y.o. male  was evaluated in triage.  Pt complains of concern for possible stroke versus TIA with some aphasia, disorientation, word finding difficulties, versus worsening dementia versus UTI.  Patient reports that he did not take his Namenda for 2 days and thinks that this is the cause of his symptoms.  EMS negative stroke screen.  On evaluation patient is alert and oriented to self, place, conversing interacting appropriately.  Review of Systems  Positive: Confusion, disorientation, word finding difficulties Negative: Chest pain, shortness of breath, unilateral deficit  Physical Exam  BP 97/85 (BP Location: Right Arm)   Pulse 88   Temp 98.4 F (36.9 C) (Oral)   Resp 15   Ht '5\' 8"'$  (1.727 m)   Wt 99.3 kg   SpO2 95%   BMI 33.30 kg/m  Gen:   Awake, no distress   Resp:  Normal effort  MSK:   Moves extremities without difficulty  Other:  Cranial nerves II through XII grossly intact.  Intact finger-nose, intact heel-to-shin.  Romberg negative, gait normal.  Alert and oriented x3.  Moves all 4 limbs spontaneously, normal coordination.  No pronator drift.  Intact strength 5 out of 5 bilateral upper and lower extremities.   Medical Decision Making  Medically screening exam initiated at 8:37 PM.  Appropriate orders placed.  Mayco Charlyne Quale was informed that the remainder of the evaluation will be completed by another provider, this initial triage assessment does not replace that evaluation, and the importance of remaining in the ED until their evaluation is complete.  Work-up initiated   Anselmo Pickler, [place] 01/19/22 2051

## 2022-01-19 NOTE — ED Triage Notes (Signed)
LKN yesterday 7:30 pm at dinner, pt states he didn't feel well before bed. Wife states spouse was waking up frequently thought the night. Was disoriented this morning. She left for work. When returning noted pt had developed diarrhea and was not making sense in his conversation.   On evaluation pt is alert x2, self/place, conversing and interacting appropriately. No complaints.

## 2022-01-20 DIAGNOSIS — J0100 Acute maxillary sinusitis, unspecified: Secondary | ICD-10-CM | POA: Diagnosis not present

## 2022-01-20 DIAGNOSIS — R410 Disorientation, unspecified: Secondary | ICD-10-CM | POA: Diagnosis not present

## 2022-01-20 NOTE — ED Notes (Signed)
Pt and the family stated that the wait was too long and he would go to his PCP in the am. Pt's family pick him up at the front door. Pt was advised to call 911 or come back if symptoms get worse. Pt was seen leaving.

## 2022-02-08 DIAGNOSIS — Z85828 Personal history of other malignant neoplasm of skin: Secondary | ICD-10-CM | POA: Diagnosis not present

## 2022-02-08 DIAGNOSIS — L821 Other seborrheic keratosis: Secondary | ICD-10-CM | POA: Diagnosis not present

## 2022-02-08 DIAGNOSIS — L570 Actinic keratosis: Secondary | ICD-10-CM | POA: Diagnosis not present

## 2022-04-20 DIAGNOSIS — N1831 Chronic kidney disease, stage 3a: Secondary | ICD-10-CM | POA: Diagnosis not present

## 2022-04-20 DIAGNOSIS — R5383 Other fatigue: Secondary | ICD-10-CM | POA: Diagnosis not present

## 2022-04-20 DIAGNOSIS — E782 Mixed hyperlipidemia: Secondary | ICD-10-CM | POA: Diagnosis not present

## 2022-04-20 DIAGNOSIS — R7309 Other abnormal glucose: Secondary | ICD-10-CM | POA: Diagnosis not present

## 2022-04-24 ENCOUNTER — Other Ambulatory Visit: Payer: Self-pay | Admitting: Cardiovascular Disease

## 2022-04-27 DIAGNOSIS — R7309 Other abnormal glucose: Secondary | ICD-10-CM | POA: Diagnosis not present

## 2022-04-27 DIAGNOSIS — E782 Mixed hyperlipidemia: Secondary | ICD-10-CM | POA: Diagnosis not present

## 2022-04-27 DIAGNOSIS — I129 Hypertensive chronic kidney disease with stage 1 through stage 4 chronic kidney disease, or unspecified chronic kidney disease: Secondary | ICD-10-CM | POA: Diagnosis not present

## 2022-04-27 DIAGNOSIS — Z0000 Encounter for general adult medical examination without abnormal findings: Secondary | ICD-10-CM | POA: Diagnosis not present

## 2022-04-27 DIAGNOSIS — E039 Hypothyroidism, unspecified: Secondary | ICD-10-CM | POA: Diagnosis not present

## 2022-04-27 DIAGNOSIS — I2510 Atherosclerotic heart disease of native coronary artery without angina pectoris: Secondary | ICD-10-CM | POA: Diagnosis not present

## 2022-04-27 DIAGNOSIS — R600 Localized edema: Secondary | ICD-10-CM | POA: Diagnosis not present

## 2022-04-27 DIAGNOSIS — N1831 Chronic kidney disease, stage 3a: Secondary | ICD-10-CM | POA: Diagnosis not present

## 2022-04-27 DIAGNOSIS — R351 Nocturia: Secondary | ICD-10-CM | POA: Diagnosis not present

## 2022-04-27 DIAGNOSIS — N3941 Urge incontinence: Secondary | ICD-10-CM | POA: Diagnosis not present

## 2022-04-27 DIAGNOSIS — R69 Illness, unspecified: Secondary | ICD-10-CM | POA: Diagnosis not present

## 2022-04-27 DIAGNOSIS — Z23 Encounter for immunization: Secondary | ICD-10-CM | POA: Diagnosis not present

## 2022-07-15 DIAGNOSIS — H35033 Hypertensive retinopathy, bilateral: Secondary | ICD-10-CM | POA: Diagnosis not present

## 2022-07-15 DIAGNOSIS — H5203 Hypermetropia, bilateral: Secondary | ICD-10-CM | POA: Diagnosis not present

## 2022-07-15 DIAGNOSIS — H43813 Vitreous degeneration, bilateral: Secondary | ICD-10-CM | POA: Diagnosis not present

## 2022-07-15 DIAGNOSIS — H2513 Age-related nuclear cataract, bilateral: Secondary | ICD-10-CM | POA: Diagnosis not present

## 2022-07-15 DIAGNOSIS — H3562 Retinal hemorrhage, left eye: Secondary | ICD-10-CM | POA: Diagnosis not present

## 2022-07-26 ENCOUNTER — Other Ambulatory Visit: Payer: Self-pay | Admitting: Cardiovascular Disease

## 2022-07-27 ENCOUNTER — Other Ambulatory Visit: Payer: Self-pay | Admitting: Cardiovascular Disease

## 2022-08-09 DIAGNOSIS — D485 Neoplasm of uncertain behavior of skin: Secondary | ICD-10-CM | POA: Diagnosis not present

## 2022-08-09 DIAGNOSIS — L570 Actinic keratosis: Secondary | ICD-10-CM | POA: Diagnosis not present

## 2022-08-09 DIAGNOSIS — D044 Carcinoma in situ of skin of scalp and neck: Secondary | ICD-10-CM | POA: Diagnosis not present

## 2022-09-01 ENCOUNTER — Ambulatory Visit: Payer: Medicare HMO | Admitting: Neurology

## 2022-09-08 ENCOUNTER — Telehealth: Payer: Self-pay | Admitting: Neurology

## 2022-09-12 ENCOUNTER — Encounter: Payer: Self-pay | Admitting: Physician Assistant

## 2022-09-13 NOTE — Telephone Encounter (Signed)
Error

## 2022-09-25 ENCOUNTER — Other Ambulatory Visit: Payer: Self-pay | Admitting: Neurology

## 2022-09-28 ENCOUNTER — Other Ambulatory Visit: Payer: Self-pay | Admitting: Neurology

## 2022-10-01 ENCOUNTER — Other Ambulatory Visit: Payer: Self-pay | Admitting: Neurology

## 2022-10-13 ENCOUNTER — Encounter: Payer: Self-pay | Admitting: Physician Assistant

## 2022-10-13 ENCOUNTER — Ambulatory Visit: Payer: Medicare HMO | Admitting: Physician Assistant

## 2022-10-13 ENCOUNTER — Ambulatory Visit: Payer: Medicare HMO

## 2022-10-13 ENCOUNTER — Other Ambulatory Visit (INDEPENDENT_AMBULATORY_CARE_PROVIDER_SITE_OTHER): Payer: Medicare HMO

## 2022-10-13 VITALS — BP 153/86 | HR 61 | Resp 18 | Ht 67.5 in | Wt 213.0 lb

## 2022-10-13 DIAGNOSIS — R413 Other amnesia: Secondary | ICD-10-CM | POA: Diagnosis not present

## 2022-10-13 DIAGNOSIS — F03918 Unspecified dementia, unspecified severity, with other behavioral disturbance: Secondary | ICD-10-CM

## 2022-10-13 LAB — TSH: TSH: 2.18 u[IU]/mL (ref 0.35–5.50)

## 2022-10-13 LAB — VITAMIN B12: Vitamin B-12: 268 pg/mL (ref 211–911)

## 2022-10-13 NOTE — Progress Notes (Addendum)
Assessment/Plan:    The patient is seen in neurologic consultation at the request of Georgianne Fick, MD for the evaluation of memory.  Dennis [name] is a very pleasant 77 y.o. 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  He is already on memantine 5 mg daily  (dizzy with bid) and donepezil 5  mg daily ( diarrhea with 10 mg) as per PCP.  Prior MRI of the brain from April 2023 was remarkable for mild generalized cortical atrophy especially around the mesial temporal lobes, chronic microvascular ischemic changes, with superimposed lacunar infarctions in the deep white matter of the hemispheres, small chronic infarctions in the cerebral hemispheres, right larger than left.  MoCA today is 12/30. Note that he had shown significant difficulties with calculations in a Editor, commissioning. 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    Dementia likely of multiple etiologies with behavioral disturbance (hallucinations)  MRI brain without contrast given the above findings, to assess for underlying structural abnormality and assess vascular load and any acute vascular changes. Neurocognitive testing to further evaluate cognitive concerns and determine other underlying cause of memory changes, including potential contribution from sleep, anxiety, or depression  Continue donepezil 5 mg and memantine 5 mg daily. Patient unable to tolerate higher doses. Discussed possibility on changing donepezil T rivastigmine at some point, hopefully able to tolerate better and reach the appropriate therapeutic dose.  Check B12, TSH Continue to control mood  he states he would like to discuss with PCP [place] up in 2  months  Subjective:    The patient is accompanied by his wife  who supplements the history.    How long did patient have memory difficulties?  For at least 2 or 3 years.  He initially was evaluated by his PCP, and around March 2023.  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 He was having a hard time writing numbers in a check. Most of the time if listening carefuly he is able to retain conversations and names. No speech difficulties. repeats oneself? Denies  Disoriented when walking into a room?  Patient denies except occasionally not remembering what patient came to the room for   Leaving objects in unusual places?   denies   Wandering behavior? denies   Any personality changes since last visit? denies   Any history of depression?:  he "may feel depressed butI don't have a diagnosis of depression"  Hallucinations or paranoia?  Sees people when standing at the window and tells his wife :  " in the woods , standing there'"  Seizures? denies    Any sleep changes?  Sleeps well.  Has + vivid dreams about his dog , and talks in the sleep "it is very real to me", +REM behavior, no sleepwalking   Sleep apnea? denies   Any hygiene concerns?  He has a schedule for showers  Independent of bathing and dressing?  He needs help with getting dressed, needs some instructions with simple tasks    Does the patient need help with medications? Patient is in charge   Who  is in charge of the finances?  Wife is in charge after he was having difficulties writing checks.     Any changes in appetite?   denies     Patient have trouble swallowing?  denies   Does the patient cook?  Wife does the cooking. Any headaches?  denies   Chronic back pain?  denies   Ambulates with difficulty? denies   Recent falls or head injuries? 4y ago he slipped from a boat trailer and hit the head  on the trailer needing staples.    Vision changes? Cataracts and astigmatism  Unilateral weakness, numbness or tingling?  denies   Any tremors?  Occasionally, not often when picking an object.  Any anosmia?  "a little bit" more than 4 y ago  Any incontinence of urine? Endorsed,  OAB, has to be reminded to go to the bathroom before" having an accident ". May need Depends at night  Any bowel dysfunction?    denies      Patient lives  wife  History of heavy alcohol intake? denies   History of heavy tobacco use? denies   Family history of dementia?  Mo with vascular dementia  and his sister with AD. Fa possible dementia ? type Does patient drive?  Short distances  Key note speaker and now he has trouble with words.   Allergies  Allergen Reactions   Sulfacetamide Sodium     Other reaction(s): Unknown    Current Outpatient Medications  Medication Instructions   amLODipine (NORVASC) 2.5 MG tablet 1 tablet, Oral, Daily   ascorbic acid (VITAMIN C) 1,000 mg, Oral, Daily   ASPIRIN 81 PO 81 mg, Oral, Daily   b complex vitamins capsule 1 capsule, Oral, Daily   clopidogrel (PLAVIX) 75 mg, Oral, Daily   donepezil (ARICEPT) 10 MG tablet Half tablet at bedtime for first month then increase to whole tablet   fenofibrate 160 mg, Oral, Daily   magnesium oxide (MAG-OX) 400 mg, Oral, Daily   memantine (NAMENDA) 10 mg, Oral, 2 times daily   metoprolol succinate (TOPROL-XL) 50 MG 24 hr tablet TAKE 1 TABLET BY MOUTH ONCE DAILY WITH  OR  IMMEDIATELY  FOLLOWING  A  MEAL.   Misc Natural Products (NEURIVA PO) 1 tablet, Oral, Daily   nitroGLYCERIN (NITROSTAT) 0.4 MG SL tablet DISSOLVE ONE TABLET UNDER THE TONGUE EVERY 5 MINUTES AS NEEDED FOR CHEST PAIN.   Omeprazole-Sodium Bicarbonate (ZEGERID) 20-1100 MG CAPS capsule 1 capsule, Oral, Daily before breakfast   pravastatin (PRAVACHOL) 20 mg, Oral, Daily   telmisartan (MICARDIS) 80 mg, Oral, Daily   Vitamin D 2,000 Units, Oral, Daily     VITALS:   Vitals:    10/13/22 1307  BP: (!) 153/86  Pulse: 61  Resp: 18  SpO2: 95%  Weight: 213 lb (96.6 kg)  Height: 5' 7.5" (1.715 m)       No data to display          PHYSICAL EXAM   HEENT:  Normocephalic, atraumatic. The mucous membranes are moist. The superficial temporal arteries are without ropiness or tenderness. Cardiovascular: Regular rate and rhythm. Lungs: Clear to auscultation bilaterally. Neck: There are no carotid bruits noted bilaterally.  NEUROLOGICAL:    10/13/2022    4:00 PM 08/31/2021    2:19 PM  Montreal Cognitive Assessment   Visuospatial/ Executive (0/5) 1 0  Naming (0/3) 3 3  Attention: Read list of digits (0/2) 2 1  Attention: Read list of letters (0/1) 1 1  Attention: Serial  7 subtraction starting at 100 (0/3) 0 1  Language: Repeat phrase (0/2) 0 2  Language : Fluency (0/1) 1 1  Abstraction (0/2) 1 2  Delayed Recall (0/5) 0 0  Orientation (0/6) 3 6  Total 12 17  Adjusted Score (based on education) 12 17        No data to display           Orientation:  Alert and oriented to person, place and not to date.  No aphasia or dysarthria. Fund of knowledge is appropriate. Recent and remote memory impaired.  Attention and concentration are reduced.  Able to name objects and unable to  repeat phrases. Delayed recall 0/5 Cranial nerves: There is good facial symmetry. Extraocular muscles are intact and visual fields are full to confrontational testing. Speech is fluent and clear. no tongue deviation. Hearing is intact to conversational tone.  Tone: Tone is good throughout. Sensation: Sensation is intact to light touch and pinprick throughout. Vibration is intact at the bilateral big toe.There is no extinction with double simultaneous stimulation.  Coordination: The patient has no difficulty with RAM's or FNF bilaterally. Normal finger to nose  Motor: Strength is 5/5 in the bilateral upper and lower extremities. There is no pronator drift. There are no fasciculations  noted. DTR's: Deep tendon reflexes are 2/4 at the bilateral biceps, triceps, brachioradialis, patella and achilles.  Plantar responses are downgoing bilaterally. Gait and Station: The patient is able to ambulate without difficulty.The patient is able to ambulate in a tandem fashion, able to stand in the Romberg position.     Thank you for allowing [place] the opportunity to participate in the care of this nice patient. Please do not hesitate to contact us for any questions or concerns.   Total time spent on today's visit was 60 minutes dedicated to this patient today, preparing to see patient, examining the patient, ordering tests and/or medications and counseling the patient, documenting clinical information in the EHR or other health record, independently interpreting results and communicating results to the patient/family, discussing treatment and goals, answering patient's questions and coordinating care.  Cc:  Georgianne Fick, MD  [name] 10/13/2022 4:39 PM

## 2022-10-13 NOTE — Patient Instructions (Addendum)
It was a pleasure to see you today at our office.   Recommendations:  Neurocognitive evaluation at our office   MRI of the brain, the radiology office will call you to arrange you appointment   Check labs today   Follow up in 2 months Keep taking memantine and donepezil     For assessment of decision of mental capacity and competency:  Call Dr. Erick Blinks, geriatric psychiatrist at [phone] Counseling regarding caregiver distress, including caregiver depression, anxiety and issues regarding community resources, adult day care programs, adult living facilities, or memory care questions:  please contact your  Primary Doctor's Social Worker  Whom to call: Memory  decline, memory medications: Call our office [phone]  For psychiatric meds, mood meds: Please have your primary care physician manage these medications.  If you have any severe symptoms of a stroke, or other severe issues such as confusion,severe chills or fever, etc call 911 or go to the ER as you may need to be evaluated further    RECOMMENDATIONS FOR ALL PATIENTS WITH MEMORY PROBLEMS: 1. Continue to exercise (Recommend 30 minutes of walking everyday, or 3 hours every week) 2. Increase social interactions - continue going to [place] and enjoy social gatherings with friends and family 3. Eat healthy, avoid fried foods and eat more fruits and vegetables 4. Maintain adequate blood pressure, blood sugar, and blood cholesterol level. Reducing the risk of stroke and cardiovascular disease also helps promoting better memory. 5. Avoid stressful situations. Live a simple life and avoid aggravations. Organize your time and prepare for the next day in anticipation. 6. Sleep well, avoid any interruptions of sleep and avoid any distractions in the bedroom that may interfere with adequate sleep quality 7. Avoid sugar, avoid sweets as there is a strong link between excessive sugar intake, diabetes, and cognitive impairment We  discussed the Mediterranean diet, which has been shown to help patients reduce the risk of progressive memory disorders and reduces cardiovascular risk. This includes eating fish, eat fruits and green leafy vegetables, nuts like almonds and hazelnuts, walnuts, and also use olive oil. Avoid fast foods and fried foods as much as possible. Avoid sweets and sugar as sugar use has been linked to worsening of memory function.  There is always a concern of gradual progression of memory problems. If this is the case, then we may need to adjust level of care according to patient needs. Support, both to the patient and caregiver, should then be put into place.      You have been referred for a neuropsychological evaluation (i.e., evaluation of memory and thinking abilities). Please bring someone with you to this appointment if possible, as it is helpful for the doctor to hear from both you and another adult who knows you well. Please bring eyeglasses and hearing aids if you wear them.    The evaluation will take approximately 3 hours and has two parts:   The first part is a clinical interview with the neuropsychologist (Dr. Milbert Coulter or Dr. Roseanne Reno). During the interview, the neuropsychologist will speak with you and the individual you brought to the appointment.    The second part of the evaluation is testing with the doctor's technician Annabelle Harman or Selena Batten). During the testing, the technician will ask you to remember different types of material, solve problems, and answer some questionnaires. Your family member will not be present for this portion of the evaluation.   Please note: We must reserve several hours of the neuropsychologist's time and the  psychometrician's time for your evaluation appointment. As such, there is a No-Show fee of $100. If you are unable to attend any of your appointments, please contact our office as soon as possible to reschedule.    FALL PRECAUTIONS: Be cautious when walking. Scan the area  for obstacles that may increase the risk of trips and falls. When getting up in the mornings, sit up at the edge of the bed for a few minutes before getting out of bed. Consider elevating the bed at the head end to avoid drop of blood pressure when getting up. Walk always in a well-lit room (use night lights in the walls). Avoid area rugs or power cords from appliances in the middle of the walkways. Use a walker or a cane if necessary and consider physical therapy for balance exercise. Get your eyesight checked regularly.  FINANCIAL OVERSIGHT: Supervision, especially oversight when making financial decisions or transactions is also recommended.  HOME SAFETY: Consider the safety of the kitchen when operating appliances like stoves, microwave oven, and blender. Consider having supervision and share cooking responsibilities until no longer able to participate in those. Accidents with firearms and other hazards in the house should be identified and addressed as well.   ABILITY TO BE LEFT ALONE: If patient is unable to contact 911 operator, consider using LifeLine, or when the need is there, arrange for someone to stay with patients. Smoking is a fire hazard, consider supervision or cessation. Risk of wandering should be assessed by caregiver and if detected at any point, supervision and safe proof recommendations should be instituted.  MEDICATION SUPERVISION: Inability to self-administer medication needs to be constantly addressed. Implement a mechanism to ensure safe administration of the medications.   DRIVING: Regarding driving, in patients with progressive memory problems, driving will be impaired. We advise to have someone else do the driving if trouble finding directions or if minor accidents are reported. Independent driving assessment is available to determine safety of driving.   If you are interested in the driving assessment, you can contact the following:  [employer] in  [place]  [place] [phone] or [phone]    [place] refers to food and lifestyle choices that are based on the traditions of countries located on the [employer]. This way of eating has been shown to help prevent certain conditions and improve outcomes for people who have chronic diseases, like kidney disease and heart disease. What are tips for following this plan? Lifestyle  Cook and eat meals together with your family, when possible. Drink enough fluid to keep your urine clear or pale yellow. Be physically active every day. This includes: Aerobic exercise like running or swimming. Leisure activities like gardening, walking, or housework. Get 7-8 hours of sleep each night. If recommended by your health care provider, drink red wine in moderation. This means 1 glass a day for nonpregnant women and 2 glasses a day for men. A glass of wine equals 5 oz (150 mL). Reading food labels  Check the serving size of packaged foods. For foods such as rice and pasta, the serving size refers to the amount of cooked product, not dry. Check the total fat in packaged foods. Avoid foods that have saturated fat or trans fats. Check the ingredients list for added sugars, such as corn syrup. Shopping  At the grocery store, buy most of your food from the areas near the walls of  the store. This includes: Fresh fruits and vegetables (produce). Grains, beans, nuts, and seeds. Some of these may be available in unpackaged forms or large amounts (in bulk). Fresh seafood. Poultry and eggs. Low-fat dairy products. Buy whole ingredients instead of prepackaged foods. Buy fresh fruits and vegetables in-season from local farmers markets. Buy frozen fruits and vegetables in resealable bags. If you do not have access to quality fresh seafood, buy precooked frozen shrimp or  canned fish, such as tuna, salmon, or sardines. Buy small amounts of raw or cooked vegetables, salads, or olives from the deli or salad bar at your store. Stock your pantry so you always have certain foods on hand, such as olive oil, canned tuna, canned tomatoes, rice, pasta, and beans. Cooking  Cook foods with extra-virgin olive oil instead of using butter or other vegetable oils. Have meat as a side dish, and have vegetables or grains as your main dish. This means having meat in small portions or adding small amounts of meat to foods like pasta or stew. Use beans or vegetables instead of meat in common dishes like chili or lasagna. Experiment with different cooking methods. Try roasting or broiling vegetables instead of steaming or sauteing them. Add frozen vegetables to soups, stews, pasta, or rice. Add nuts or seeds for added healthy fat at each meal. You can add these to yogurt, salads, or vegetable dishes. Marinate fish or vegetables using olive oil, lemon juice, garlic, and fresh herbs. Meal planning  Plan to eat 1 vegetarian meal one day each week. Try to work up to 2 vegetarian meals, if possible. Eat seafood 2 or more times a week. Have healthy snacks readily available, such as: Vegetable sticks with hummus. Greek yogurt. Fruit and nut trail mix. Eat balanced meals throughout the week. This includes: Fruit: 2-3 servings a day Vegetables: 4-5 servings a day Low-fat dairy: 2 servings a day Fish, poultry, or lean meat: 1 serving a day Beans and legumes: 2 or more servings a week Nuts and seeds: 1-2 servings a day Whole grains: 6-8 servings a day Extra-virgin olive oil: 3-4 servings a day Limit red meat and sweets to only a few servings a month What are my food choices? Mediterranean diet Recommended Grains: Whole-grain pasta. Brown rice. Bulgar wheat. Polenta. Couscous. Whole-wheat bread. [place]. Vegetables: Artichokes. Beets. Broccoli. Cabbage. Carrots. Eggplant.  Green beans. Chard. Kale. Spinach. Onions. Leeks. Peas. Squash. Tomatoes. Peppers. Radishes. Fruits: Apples. Apricots. Avocado. Berries. Bananas. Cherries. Dates. Figs. Grapes. Lemons. Melon. Oranges. Peaches. Plums. Pomegranate. Meats and other protein foods: Beans. Almonds. Sunflower seeds. Pine nuts. Peanuts. [place]. Salmon. Scallops. Shrimp. [place]. Tilapia. Clams. Oysters. Eggs. Dairy: Low-fat milk. Cheese. Greek yogurt. Beverages: Water. Red wine. Herbal tea. Fats and oils: Extra virgin olive oil. Avocado oil. Grape seed oil. Sweets and desserts: [place] yogurt with honey. Baked apples. Poached pears. Trail mix. Seasoning and other foods: Basil. Cilantro. Coriander. Cumin. Mint. Parsley. Sage. Rosemary. Tarragon. Garlic. Oregano. Thyme. Pepper. Balsalmic vinegar. Tahini. Hummus. Tomato sauce. Olives. Mushrooms. Limit these Grains: Prepackaged pasta or rice dishes. Prepackaged cereal with added sugar. Vegetables: Deep fried potatoes (french fries). Fruits: Fruit canned in syrup. Meats and other protein foods: Beef. Pork. Lamb. Poultry with skin. Hot dogs. Berniece Salines. Dairy: Ice cream. Sour cream. Whole milk. Beverages: Juice. Sugar-sweetened soft drinks. Beer. Liquor and spirits. Fats and oils: Butter. Canola oil. Vegetable oil. Beef fat (tallow). Lard. Sweets and desserts: Cookies. Cakes. Pies. Candy. Seasoning and other foods: Mayonnaise. Premade sauces and marinades. The items listed may  not be a complete list. Talk with your dietitian about what dietary choices are right for you. Summary The Mediterranean diet includes both food and lifestyle choices. Eat a variety of fresh fruits and vegetables, beans, nuts, seeds, and whole grains. Limit the amount of red meat and sweets that you eat. Talk with your health care provider about whether it is safe for you to drink red wine in moderation. This means 1 glass a day for nonpregnant women and 2 glasses a day for men. A glass of wine equals 5 oz (150  mL). This information is not intended to replace advice given to you by your health care provider. Make sure you discuss any questions you have with your health care provider. Document Released: 01/14/2016 Document Revised: 02/16/2016 Document Reviewed: 01/14/2016 Elsevier Interactive Patient Education  2017 [employer].   [address] Mri at [facility] [phone]

## 2022-10-13 NOTE — Progress Notes (Signed)
B12 is on the lower side, recommend vitamin B12 is low.  Recommend starting on vitamin B12 1000 mcg daily.  Thyoid levels are normal. Follow-up with PCP.  Thank you

## 2022-10-14 ENCOUNTER — Other Ambulatory Visit: Payer: Self-pay | Admitting: Cardiovascular Disease

## 2022-10-14 ENCOUNTER — Other Ambulatory Visit: Payer: Self-pay | Admitting: Physician Assistant

## 2022-10-14 DIAGNOSIS — H40022 Open angle with borderline findings, high risk, left eye: Secondary | ICD-10-CM | POA: Diagnosis not present

## 2022-10-14 DIAGNOSIS — H2513 Age-related nuclear cataract, bilateral: Secondary | ICD-10-CM | POA: Diagnosis not present

## 2022-10-14 DIAGNOSIS — H35033 Hypertensive retinopathy, bilateral: Secondary | ICD-10-CM | POA: Diagnosis not present

## 2022-10-14 DIAGNOSIS — H43813 Vitreous degeneration, bilateral: Secondary | ICD-10-CM | POA: Diagnosis not present

## 2022-10-14 MED ORDER — MEMANTINE HCL 10 MG PO TABS: 10.0000 mg | ORAL_TABLET | Freq: Every day | ORAL | 11 refills | Status: DC

## 2022-10-14 NOTE — Telephone Encounter (Signed)
Not letting me do the refill. If you have a min, can you try to refill route it and I will sign, thanks in advance

## 2022-10-14 NOTE — Telephone Encounter (Signed)
Pt wife called and states that they need a refill on the Memantine 10 mg called in to the [facility] [address] [place] - [address] [phone]

## 2022-10-20 ENCOUNTER — Other Ambulatory Visit: Payer: Self-pay | Admitting: Cardiovascular Disease

## 2022-10-20 ENCOUNTER — Encounter: Payer: Self-pay | Admitting: Physician Assistant

## 2022-10-25 DIAGNOSIS — E782 Mixed hyperlipidemia: Secondary | ICD-10-CM | POA: Diagnosis not present

## 2022-10-25 DIAGNOSIS — N1831 Chronic kidney disease, stage 3a: Secondary | ICD-10-CM | POA: Diagnosis not present

## 2022-10-25 DIAGNOSIS — F03B0 Unspecified dementia, moderate, without behavioral disturbance, psychotic disturbance, mood disturbance, and anxiety: Secondary | ICD-10-CM | POA: Diagnosis not present

## 2022-10-25 DIAGNOSIS — R7309 Other abnormal glucose: Secondary | ICD-10-CM | POA: Diagnosis not present

## 2022-10-25 DIAGNOSIS — I129 Hypertensive chronic kidney disease with stage 1 through stage 4 chronic kidney disease, or unspecified chronic kidney disease: Secondary | ICD-10-CM | POA: Diagnosis not present

## 2022-10-25 DIAGNOSIS — Z0100 Encounter for examination of eyes and vision without abnormal findings: Secondary | ICD-10-CM | POA: Diagnosis not present

## 2022-11-01 DIAGNOSIS — N1831 Chronic kidney disease, stage 3a: Secondary | ICD-10-CM | POA: Diagnosis not present

## 2022-11-01 DIAGNOSIS — I2510 Atherosclerotic heart disease of native coronary artery without angina pectoris: Secondary | ICD-10-CM | POA: Diagnosis not present

## 2022-11-01 DIAGNOSIS — R3589 Other polyuria: Secondary | ICD-10-CM | POA: Diagnosis not present

## 2022-11-01 DIAGNOSIS — R7309 Other abnormal glucose: Secondary | ICD-10-CM | POA: Diagnosis not present

## 2022-11-01 DIAGNOSIS — E782 Mixed hyperlipidemia: Secondary | ICD-10-CM | POA: Diagnosis not present

## 2022-11-01 DIAGNOSIS — F03B0 Unspecified dementia, moderate, without behavioral disturbance, psychotic disturbance, mood disturbance, and anxiety: Secondary | ICD-10-CM | POA: Diagnosis not present

## 2022-11-01 DIAGNOSIS — I129 Hypertensive chronic kidney disease with stage 1 through stage 4 chronic kidney disease, or unspecified chronic kidney disease: Secondary | ICD-10-CM | POA: Diagnosis not present

## 2022-11-08 ENCOUNTER — Telehealth: Payer: Self-pay | Admitting: Cardiovascular Disease

## 2022-11-08 DIAGNOSIS — I10 Essential (primary) hypertension: Secondary | ICD-10-CM

## 2022-11-08 NOTE — Telephone Encounter (Signed)
Patient and wife states PCP would like to increase amlodipine. Patient and wife is concerned because the amlodipine was decreased because it was making patient dizzy. They only have readings from doctor visits and she states the average was 140/90. They would like to know if you are in agreement with the increase in his amlodipine. He also was wondering do he needs to switch back to Ramipril.

## 2022-11-08 NOTE — Telephone Encounter (Signed)
Pt c/o medication issue:  1. Name of Medication:   amLODipine (NORVASC) 2.5 MG tablet    2. How are you currently taking this medication (dosage and times per day)?   Take 1 tablet by mouth daily.    3. Are you having a reaction (difficulty breathing--STAT)? No  4. What is your medication issue? Pt states that PCP is suggesting that above medication dosage be increased. Pt would like Dr. Hazle Coca opinion as well as if medication needs to be changed, can it be changed to Ramipril. Please advise

## 2022-11-08 NOTE — Telephone Encounter (Signed)
Patient and wife aware to continue with same dose of amlodipine and to keep BP log. Will forward to PharmD.

## 2022-11-09 NOTE — Telephone Encounter (Signed)
Called wife to sch pharm D appt and she wants to know if he can take the ramipril instead of the amlodipine.  She stated pt is having trouble with his kidneys right now.   They are concerned uping the Amlodipine.  She stated that she also does not have a script for the 2.5 Amlodipine.    Best [phone]

## 2022-11-10 NOTE — Telephone Encounter (Signed)
Spoke with the patient and informed that Dr Allyson Sabal would like for the pt to be seen by an APP to discuss medication changes.   Asked to bring blood pressure log to appt. They will discuss BP, medications, symptoms, lab work to manage medication. Verbalized understanding.   Appt made with Bernadene Person, NP for 11/16/22 at 2:45

## 2022-11-16 ENCOUNTER — Ambulatory Visit: Payer: Medicare HMO | Admitting: General Practice

## 2022-11-16 ENCOUNTER — Encounter: Payer: Self-pay | Admitting: Nurse Practitioner

## 2022-11-16 ENCOUNTER — Ambulatory Visit: Payer: Medicare HMO | Attending: Nurse Practitioner | Admitting: Nurse Practitioner

## 2022-11-16 VITALS — BP 134/68 | HR 59 | Ht 68.0 in | Wt 210.2 lb

## 2022-11-16 DIAGNOSIS — I2510 Atherosclerotic heart disease of native coronary artery without angina pectoris: Secondary | ICD-10-CM | POA: Diagnosis not present

## 2022-11-16 DIAGNOSIS — Z9861 Coronary angioplasty status: Secondary | ICD-10-CM

## 2022-11-16 DIAGNOSIS — F03918 Unspecified dementia, unspecified severity, with other behavioral disturbance: Secondary | ICD-10-CM | POA: Diagnosis not present

## 2022-11-16 DIAGNOSIS — R42 Dizziness and giddiness: Secondary | ICD-10-CM

## 2022-11-16 DIAGNOSIS — I724 Aneurysm of artery of lower extremity: Secondary | ICD-10-CM

## 2022-11-16 DIAGNOSIS — E038 Other specified hypothyroidism: Secondary | ICD-10-CM | POA: Diagnosis not present

## 2022-11-16 DIAGNOSIS — I10 Essential (primary) hypertension: Secondary | ICD-10-CM

## 2022-11-16 DIAGNOSIS — E785 Hyperlipidemia, unspecified: Secondary | ICD-10-CM

## 2022-11-16 MED ORDER — CARVEDILOL 3.125 MG PO TABS: 3.1250 mg | ORAL_TABLET | Freq: Two times a day (BID) | ORAL | 3 refills | Status: DC

## 2022-11-16 NOTE — Patient Instructions (Addendum)
Medication Instructions:  Stop Amlodipine as directed Stop Metoprolol as directed Start Carvedilol 3.125 mg twice daily  *If you need a refill on your cardiac medications before your next appointment, please call your pharmacy*   Lab Work: NONE ordered at this time of appointment    Testing/Procedures: NONE ordered at this time of appointment     Follow-Up: At [facility], you and your health needs are our priority.  As part of our continuing mission to provide you with exceptional heart care, we have created designated Provider Care Teams.  These Care Teams include your primary Cardiologist (physician) and Advanced Practice Providers (APPs -  Physician Assistants and Nurse Practitioners) who all work together to provide you with the care you need, when you need it.  We recommend signing up for the patient portal called "MyChart".  Sign up information is provided on this After Visit Summary.  MyChart is used to connect with patients for Virtual Visits (Telemedicine).  Patients are able to view lab/test results, encounter notes, upcoming appointments, etc.  Non-urgent messages can be sent to your provider as well.   To learn more about what you can do with MyChart, go to [email or web address].    Your next appointment:   Keep follow up   Provider:   Dr. Allyson Sabal    Other Instructions

## 2022-11-16 NOTE — Progress Notes (Signed)
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  Past Medical History    Past Medical History:  Diagnosis Date   Anginal pain (HCC)    denied chest pain 11/28/18   CAD (coronary artery disease)    a. 2002 Stent RCA & circumflex EF40-50%    GERD (gastroesophageal reflux disease)    Hyperlipidemia    Hypertension    Hypothyroidism    Myocardial infarction (HCC) 03/2001   Squamous cell carcinoma of scalp    "some burned, some cut off" (11/17/2016)   Thyroid nodule, hot    s/p radioactive iodine, now hypothyroidism   Past Surgical History:  Procedure Laterality Date   BYPASS GRAFT POPLITEAL TO POPLITEAL Right 11/30/2018   Procedure: RIGHT POPLITEAL ARTERY ANEURYSM REPAIR WITH ABOVE KNEE POPLITEAL TO BELOW KNEE POPLITEAL BY PASS GRAFT USING SAPPEHNOUS VEIN;  Surgeon: Larina Earthly, MD;  Location: [facility] OR;  Service: Vascular;  Laterality: Right;   CARDIAC CATHETERIZATION  09/2014   "day before they put the stent in"   CORONARY ANGIOPLASTY WITH STENT PLACEMENT  03/2001; 09/2014; 11/17/2016   "1+1+1"   CORONARY PRESSURE/FFR STUDY N/A 11/17/2016   Procedure: Intravascular Pressure Wire/FFR Study;  Surgeon: Runell Gess, MD;  Location: [facility] INVASIVE CV LAB;  Service: Cardiovascular;  Laterality: N/A;   CORONARY STENT INTERVENTION N/A 11/17/2016   Procedure: Coronary Stent Intervention;  Surgeon: Runell Gess, MD;  Location: [facility] INVASIVE CV LAB;  Service: Cardiovascular;  Laterality: N/A;   LEFT HEART CATH AND CORONARY ANGIOGRAPHY  N/A 11/17/2016   Procedure: Left Heart Cath and Coronary Angiography;  Surgeon: Runell Gess, MD;  Location: [facility] INVASIVE CV LAB;  Service: Cardiovascular;  Laterality: N/A;   LEFT HEART CATHETERIZATION WITH CORONARY ANGIOGRAM N/A 09/30/2014   Procedure: LEFT HEART CATHETERIZATION WITH CORONARY ANGIOGRAM;  Surgeon: Lennette Bihari, MD;  Location: [facility] CATH LAB;  Service: Cardiovascular;  Laterality: N/A;   PERCUTANEOUS CORONARY STENT INTERVENTION (PCI-S) N/A 10/02/2014   Procedure: PERCUTANEOUS CORONARY STENT INTERVENTION (PCI-S);  Surgeon: Kathleene Hazel, MD;  Location: [facility] CATH LAB;  Service: Cardiovascular;  Laterality: N/A;   SQUAMOUS CELL CARCINOMA EXCISION     "scalp"   WISDOM TOOTH EXTRACTION      Allergies  Allergies  Allergen Reactions   Sulfacetamide Sodium     Other reaction(s): Unknown     Labs/Other Studies Reviewed    The following studies were reviewed today:  Cardiac Studies & Procedures   CARDIAC CATHETERIZATION  CARDIAC CATHETERIZATION 11/17/2016  Narrative Images from the original result were not included.   Ost RCA to Prox RCA lesion, 40 %stenosed.  Prox Cx to Mid Cx lesion, 0 %stenosed.  Mid Cx lesion, 70 %stenosed.  Post intervention, there is a 0% residual stenosis.  A stent was successfully placed.  The left ventricular systolic function is normal.  LV end diastolic pressure is normal.  The left ventricular ejection fraction is 55-65% by visual estimate.  Dennis [name] is a 77 y.o. male   [id number] LOCATION:  FACILITY: [facility] PHYSICIAN: Nanetta Batty, M.D. 08-06-1945  DATE OF PROCEDURE:  11/17/2016  DATE OF DISCHARGE:     CARDIAC CATHETERIZATION / PCI-DES LCX    History obtained from chart review.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 He is a patient of Dr. Marylou Flesher. I last saw him in the office 04/13/16.  History of hyperlipidemia and CAD status post RCA and circumflex intervention by Dr. Aleen Campi back in 2002. He was admitted to [facility] 09/30/14 with unstable angina. He underwent cardiac catheterization that day by Dr. Tresa Endo revealing a high-grade mid circumflex lesion on a bend and was stented 2 days later by Dr. Sanjuana Kava  with a Promus premiere drug eluting stent postdilated with a 4.5 mm balloon. Since I saw him last he has been essentially asymptomatic up until recently when he had an episode of nitrate responsive chest pain on Mother's Day and a subsequent episode as well. Because the symptoms were similar to his pre-stent symptoms it was elected to proceed with outpatient cardiac catheterization   PROCEDURE DESCRIPTION:  The patient was brought to the second floor [facility] cath lab in the postabsorptive state. He was premedicated with Valium 5 mg by mouth, IV Versed and fentanyl. His right wrist was prepped and shaved in usual sterile fashion. Xylocaine 1% was used for local anesthesia. A 6 French sheath was inserted into the right radial artery using standard Seldinger technique. The patient received 5000 units  of heparin  intravenously.  A 5 [place] TIG catheter, left Judkins catheter and pigtail catheters were used for selective coronary angiography and left ventriculography respectively. Isovue dye was used for the entirety of the case. Retrograde aortic, left ventricular and pullback pressures were recorded. Radial cocktail was administered via the SideArm sheath.  The patient received Angiomax bolus followed by infusion with a therapeutic ACT demonstrated. He was already on aspirin and Plavix. The previously placed mid AV groove circumflex stent was widely patent. There was an intermediate lesion just beyond this after a bend. Because of this I elected to perform FFR using the ACIST device after infusing intravenous adenosine. The FFR was 0.7 suggesting the lesion was  physiologically significant. Using a 6 [place] XB 3.5 cm guide catheter along with an 014 Pro water guide wire and a 2 mm x 12 mm balloon predilatation was performed. Following this a 3 mm x 12 mm Synergy drug-eluting stent was then carefully positioned angiographically and deployed at 16 atm. It was postdilated with a 3.25 mm x 12 mm noncompliant balloon up to 16 atm (3.3 mm) with diffuse 70% stenosis to 0% residual. The patient tolerated the procedure well. The guidewire and catheter were removed. The sheath was removed and a TR band was placed on the right wrist which is patent hemostasis. Angiomax was turned off. The patient left the lab in stable condition.  Impression Successful FFR guided mid AV groove circumflex PCI and drug-eluting stent of a 70% lesion just beyond the previously stented segment with an excellent angiographic result. Patient will be discharged the morning underwent topical therapy.  Nanetta Batty. MD, [facility] 11/17/2016 3:01 PM  Findings Coronary Findings Diagnostic  Dominance: Right  Left Circumflex Prox Cx to Mid Cx lesion with no stenosis was previously treated.  Right Coronary Artery  Intervention  Mid Cx lesion Angioplasty A stent was successfully placed. There is a 0% residual stenosis post intervention.  Recent Labs: 01/19/2022: ALT 19; BUN 25; Creatinine, Ser 1.30; Hemoglobin 15.3; Platelets 200; Potassium 4.0; Sodium 137 10/13/2022: TSH 2.18  Recent Lipid Panel    Component Value Date/Time   CHOL 148 09/03/2019 0820   TRIG 330 (H) 09/03/2019 0820   HDL 24 (L) 09/03/2019 0820   CHOLHDL 6.2 (H) 09/03/2019 0820   CHOLHDL 4.8 10/01/2014 0303   VLDL 68 (H) 10/01/2014 0303   LDLCALC 71 09/03/2019 0820    History of Present Illness    77 year old male with the above past medical history including CAD s/p stenting-RCA and LCx in 2002, DES-mid LCx in 2016, DES-LCx in 2018, right popliteal artery aneurysm s/p right above to below the knee  bypass grafting in 11/2018, hypertension, hyperlipidemia, hypothyroidism, dementia, and GERD.  He has a history of CAD s/p RCA and circumflex intervention by Dr. Aleen Campi in 2002.  He was hospitalized in April 2016 in the setting of unstable angina.  Cardiac catheterization revealed high-grade mid circumflex lesion on a bend and was stented 2 days later by Dr. Clifton James with a Promus Premier drug-eluting stent.  He later experienced nitrate responsive chest pain.  Cardiac catheterization in 11/2016 revealed widely patent proximal circumflex stent, widely patent RCA, 70% lesion in the AV groove circumflex beyond the previously stented segment, s/p DES.  Has a history of right popliteal artery aneurysm s/p right above to below-knee bypass grafting by Dr. Tawanna Cooler early in 11/2018.  He was last seen in the office on 10/12/2021 and was doing well from a cardiac standpoint.   He denied symptoms concerning for angina. He was evaluated in the ED in August 2023 with concern for TIA.  Unfortunately, patient left prior to full evaluation though CT of the head was negative for acute process.   He presents today for follow-up accompanied by his wife.  Since his last visit stable overall from a cardiac standpoint.  He does note some intermittent dizziness.  He is concerned that this could be a side effect of his amlodipine.  BP has been stable overall.  He has also been experiencing urinary frequency.  He has follow-up scheduled with urology to discuss this further.  He denies any palpitations, presyncope, syncope.  He is following with neurology for management of his dementia and is pending repeat MRI later this month.  Overall, from a cardiac standpoint, he reports feeling well.  Home Medications    Current Outpatient Medications  Medication Sig Dispense Refill   ascorbic acid (VITAMIN C) 1000 MG tablet Take 1,000 mg by mouth daily.     ASPIRIN 81 PO Take 81 mg by mouth daily.     b complex vitamins capsule Take 1 capsule by  mouth daily.     carvedilol (COREG) 3.125 MG tablet Take 1 tablet (3.125 mg total) by mouth 2 (two) times daily. 180 tablet 3   Cholecalciferol (VITAMIN D) 50 MCG (2000 UT) tablet Take 2,000 Units by mouth daily.     clopidogrel (PLAVIX) 75 MG tablet Take 1 tablet by mouth once daily 90 tablet 3   donepezil (ARICEPT) 10 MG tablet Half tablet at bedtime for first month then increase to whole tablet     fenofibrate 160 MG tablet Take 160 mg by mouth daily.     ipratropium (ATROVENT) 0.03 % nasal spray Place 2 sprays into both nostrils 2 (two) times daily.     magnesium oxide (MAG-OX) 400 MG tablet Take 400 mg by mouth daily.     memantine (NAMENDA) 10 MG  tablet Take 1 tablet (10 mg total) by mouth daily. 30 tablet 11   Misc Natural Products (NEURIVA PO) Take 1 tablet by mouth daily.     nitroGLYCERIN (NITROSTAT) 0.4 MG SL tablet DISSOLVE ONE TABLET UNDER THE TONGUE EVERY 5 MINUTES AS NEEDED FOR CHEST PAIN. 25 tablet 0   Omeprazole-Sodium Bicarbonate (ZEGERID) 20-1100 MG CAPS capsule Take 1 capsule by mouth daily before breakfast.     pravastatin (PRAVACHOL) 20 MG tablet Take 20 mg by mouth daily.     telmisartan (MICARDIS) 80 MG tablet Take 80 mg by mouth daily.     timolol (TIMOPTIC) 0.5 % ophthalmic solution Place 1 drop into both eyes daily.     No current facility-administered medications for this visit.     Review of Systems    He denies chest pain, palpitations, dyspnea, pnd, orthopnea, n, v, syncope, edema, weight gain, or early satiety. All other systems reviewed and are otherwise negative except as noted above.   Physical Exam    VS:  BP 134/68 (BP Location: Left Arm, Patient Position: Sitting, Cuff Size: Normal)   Pulse (!) 59   Ht 5\' 8"  (1.727 m)   Wt 210 lb 3.2 oz (95.3 kg)   SpO2 94%   BMI 31.96 kg/m   GEN: Well nourished, well developed, in no acute distress. HEENT: normal. Neck: Supple, no JVD, carotid bruits, or masses. Cardiac: RRR, no murmurs, rubs, or gallops. No  clubbing, cyanosis, edema.  Radials/DP/PT 2+ and equal bilaterally.  Respiratory:  Respirations regular and unlabored, clear to auscultation bilaterally. GI: Soft, nontender, nondistended, BS + x 4. MS: no deformity or atrophy. Skin: warm and dry, no rash. Neuro:  Strength and sensation are intact. Psych: Normal affect.  Accessory Clinical Findings    ECG personally reviewed by me today -sinus bradycardia, 59 bpm, RBBB, LAD- no acute changes.   Lab Results  Component Value Date   WBC 6.7 01/19/2022   HGB 15.3 01/19/2022   HCT 45.0 01/19/2022   MCV 90.4 01/19/2022   PLT 200 01/19/2022   Lab Results  Component Value Date   CREATININE 1.30 (H) 01/19/2022   BUN 25 (H) 01/19/2022   NA 137 01/19/2022   K 4.0 01/19/2022   CL 111 01/19/2022   CO2 22 01/19/2022   Lab Results  Component Value Date   ALT 19 01/19/2022   AST 23 01/19/2022   ALKPHOS 38 01/19/2022   BILITOT 0.9 01/19/2022   Lab Results  Component Value Date   CHOL 148 09/03/2019   HDL 24 (L) 09/03/2019   LDLCALC 71 09/03/2019   TRIG 330 (H) 09/03/2019   CHOLHDL 6.2 (H) 09/03/2019    No results found for: "HGBA1C"  Assessment & Plan    1. CAD: S/p stenting-RCA and LCx in 2002, DES-mid LCx in 2016, DES-LCx in 2018. Stable with no anginal symptoms. No indication for ischemic evaluation.  Continue Plavix, carvedilol as below, telmisartan, pravastatin and fenofibrate.  2. Right popliteal artery aneurysm: S/p right above to below the knee bypass grafting in 11/2018.  Follows with vascular surgery.  3. Hypertension/dizziness: BP well controlled.  He is concerned that his amlodipine has been causing intermittent dizziness.  Through shared decision making, will disontinue amlodipine.  Will transition from metoprolol to carvedilol 3.125 mg twice daily for better BP control.   Continue to monitor BP and report BP consistently greater than 140/80. Continue telmisartan.   4. Hyperlipidemia: LDL was 92 in 10/2022.  Monitored  and managed per PCP.  Continue pravastatin.  5. Hypothyroidism: TSH was 2.18 in 10/2022.  Monitored and managed per PCP.  6. Dementia: Follows with neurology.   7. Disposition: Follow-up in 2-3 months.      Joylene Grapes, NP 11/16/2022, 6:36 PM

## 2022-11-28 ENCOUNTER — Ambulatory Visit
Admission: RE | Admit: 2022-11-28 | Discharge: 2022-11-28 | Disposition: A | Discharge status: Home / Self Care | Payer: Medicare HMO | Source: Ambulatory Visit | Attending: Physician Assistant | Admitting: Physician Assistant

## 2022-11-28 DIAGNOSIS — R413 Other amnesia: Secondary | ICD-10-CM | POA: Diagnosis not present

## 2022-11-28 DIAGNOSIS — I6782 Cerebral ischemia: Secondary | ICD-10-CM | POA: Diagnosis not present

## 2022-11-28 DIAGNOSIS — I6389 Other cerebral infarction: Secondary | ICD-10-CM | POA: Diagnosis not present

## 2022-12-06 NOTE — Progress Notes (Signed)
Will need to check an echo, carotid ultrasound and 30 day holter monitor to make sure the circulation is ok. The MRI shows chronic changes in the vessels, remote tiny strokes, as well little mini area suspicious for strokes in the left side of the frontal head, that required further studies by cardiology.  We need to make sure that nothing coming from the heart is causing these changes.  Thank you

## 2022-12-07 ENCOUNTER — Telehealth: Payer: Self-pay

## 2022-12-07 ENCOUNTER — Other Ambulatory Visit: Payer: Self-pay

## 2022-12-07 DIAGNOSIS — I2510 Atherosclerotic heart disease of native coronary artery without angina pectoris: Secondary | ICD-10-CM

## 2022-12-07 DIAGNOSIS — I639 Cerebral infarction, unspecified: Secondary | ICD-10-CM

## 2022-12-07 DIAGNOSIS — F03918 Unspecified dementia, unspecified severity, with other behavioral disturbance: Secondary | ICD-10-CM

## 2022-12-07 NOTE — Telephone Encounter (Signed)
Discussing with Dr. Allyson Sabal on test, will go ahead and order at [facility]

## 2022-12-07 NOTE — Telephone Encounter (Signed)
Patient sees cardiologist Dr.Jonathan  Dennis [name].was told not to come back till Sept for follow up in any more, please advise.

## 2022-12-07 NOTE — Telephone Encounter (Signed)
Dennis [name] isn't there today, will call on Friday 12/09/2022

## 2022-12-07 NOTE — Telephone Encounter (Signed)
Needs to have the testing earlier, can keep his cards f/u as scheduled. Thanks

## 2022-12-09 ENCOUNTER — Telehealth: Payer: Self-pay | Admitting: Physician Assistant

## 2022-12-09 ENCOUNTER — Other Ambulatory Visit: Payer: Self-pay

## 2022-12-09 DIAGNOSIS — I639 Cerebral infarction, unspecified: Secondary | ICD-10-CM

## 2022-12-09 NOTE — Telephone Encounter (Signed)
Left message at 2:46pm to call office for any further questions, if needed

## 2022-12-09 NOTE — Telephone Encounter (Signed)
Patient's wife has questions concernign upcoming appointments on 12/12/22. Wife would like clarification . Dennis [name]

## 2022-12-09 NOTE — Telephone Encounter (Signed)
[facility] is scheduling patient for Monday , they will call patient.

## 2022-12-12 ENCOUNTER — Ambulatory Visit (HOSPITAL_BASED_OUTPATIENT_CLINIC_OR_DEPARTMENT_OTHER)
Admission: RE | Admit: 2022-12-12 | Discharge: 2022-12-12 | Disposition: A | Discharge status: Home / Self Care | Payer: Medicare HMO | Source: Ambulatory Visit | Attending: Physician Assistant | Admitting: Physician Assistant

## 2022-12-12 ENCOUNTER — Encounter: Payer: Self-pay | Admitting: Cardiovascular Disease

## 2022-12-12 ENCOUNTER — Other Ambulatory Visit: Payer: Self-pay | Admitting: Physician Assistant

## 2022-12-12 ENCOUNTER — Telehealth: Payer: Self-pay | Admitting: Cardiovascular Disease

## 2022-12-12 ENCOUNTER — Ambulatory Visit (HOSPITAL_COMMUNITY)
Admission: RE | Admit: 2022-12-12 | Discharge: 2022-12-12 | Disposition: A | Discharge status: Home / Self Care | Payer: Medicare HMO | Source: Ambulatory Visit | Attending: Physician Assistant | Admitting: Physician Assistant

## 2022-12-12 DIAGNOSIS — Z87891 Personal history of nicotine dependence: Secondary | ICD-10-CM | POA: Diagnosis not present

## 2022-12-12 DIAGNOSIS — F0390 Unspecified dementia without behavioral disturbance: Secondary | ICD-10-CM | POA: Diagnosis not present

## 2022-12-12 DIAGNOSIS — Z8673 Personal history of transient ischemic attack (TIA), and cerebral infarction without residual deficits: Secondary | ICD-10-CM | POA: Insufficient documentation

## 2022-12-12 DIAGNOSIS — I639 Cerebral infarction, unspecified: Secondary | ICD-10-CM

## 2022-12-12 DIAGNOSIS — I340 Nonrheumatic mitral (valve) insufficiency: Secondary | ICD-10-CM | POA: Diagnosis not present

## 2022-12-12 DIAGNOSIS — R3915 Urgency of urination: Secondary | ICD-10-CM | POA: Diagnosis not present

## 2022-12-12 DIAGNOSIS — I5030 Unspecified diastolic (congestive) heart failure: Secondary | ICD-10-CM | POA: Diagnosis not present

## 2022-12-12 DIAGNOSIS — R350 Frequency of micturition: Secondary | ICD-10-CM | POA: Diagnosis not present

## 2022-12-12 DIAGNOSIS — N411 Chronic prostatitis: Secondary | ICD-10-CM | POA: Diagnosis not present

## 2022-12-12 DIAGNOSIS — I129 Hypertensive chronic kidney disease with stage 1 through stage 4 chronic kidney disease, or unspecified chronic kidney disease: Secondary | ICD-10-CM | POA: Diagnosis not present

## 2022-12-12 DIAGNOSIS — N400 Enlarged prostate without lower urinary tract symptoms: Secondary | ICD-10-CM

## 2022-12-12 DIAGNOSIS — I4510 Unspecified right bundle-branch block: Secondary | ICD-10-CM | POA: Insufficient documentation

## 2022-12-12 DIAGNOSIS — I6389 Other cerebral infarction: Secondary | ICD-10-CM

## 2022-12-12 DIAGNOSIS — I083 Combined rheumatic disorders of mitral, aortic and tricuspid valves: Secondary | ICD-10-CM | POA: Insufficient documentation

## 2022-12-12 DIAGNOSIS — E785 Hyperlipidemia, unspecified: Secondary | ICD-10-CM | POA: Diagnosis not present

## 2022-12-12 HISTORY — DX: Benign prostatic hyperplasia without lower urinary tract symptoms: N40.0

## 2022-12-12 LAB — ECHOCARDIOGRAM COMPLETE
AR max vel: 2.63 cm2
AV Area VTI: 2.83 cm2
AV Area mean vel: 2.53 cm2
AV Mean grad: 3 mmHg
AV Peak grad: 4.8 mmHg
AV Vena cont: 0.3 cm
Ao pk vel: 1.1 m/s
Area-P 1/2: 2.17 cm2
Calc EF: 49.8 %
MV M vel: 5.47 m/s
MV Peak grad: 119.7 mmHg
MV Vena cont: 0.2 cm
P 1/2 time: 1055 msec
Radius: 0.2 cm
S' Lateral: 3.9 cm
Single Plane A2C EF: 49.6 %
Single Plane A4C EF: 49.9 %

## 2022-12-12 NOTE — Progress Notes (Signed)
Carotid ultrasound unremarkable, no significant narrowing, no acute findings thanks

## 2022-12-12 NOTE — Telephone Encounter (Signed)
Urology, Dr Marlou Porch wants patient to stop meloxicam. He would like him to be off Plavix for 6 weeks while treating enlarged prostate with the meloxicam.  Prostate inflamed.  Call to [facility] and they will fax the OV notes from today.

## 2022-12-12 NOTE — Telephone Encounter (Signed)
LVM to call office.

## 2022-12-12 NOTE — Telephone Encounter (Signed)
Error

## 2022-12-12 NOTE — Telephone Encounter (Signed)
Pt calling regarding being taken off of the PLAVIX in order for him to take meloxicam to decrease the size of the prostate. The Urology doctor does not want him to take the medication while taking Plavix and the pt would like a callback regarding this matter. Please advise.

## 2022-12-12 NOTE — Telephone Encounter (Signed)
Patient's wife is returning call. Requesting return call.  

## 2022-12-13 NOTE — Telephone Encounter (Signed)
Spoke with wife and gave her information.  She states thank you and he will start the [facility]

## 2022-12-13 NOTE — Telephone Encounter (Signed)
Runell Gess, MD  [facility] [phone] hours ago (6:01 PM)    OK to hold plavix    Called to give the message above. Left a message on voicemail and gave call back number.

## 2022-12-13 NOTE — Telephone Encounter (Signed)
Call to [facility] and Spoke with the nurse Vernona Rieger  and she took verbal order for patient to HOLD Plavix for 6 weeks.  If any issues she will call back.

## 2022-12-14 ENCOUNTER — Encounter: Payer: Self-pay | Admitting: Physician Assistant

## 2022-12-14 ENCOUNTER — Ambulatory Visit: Payer: Medicare HMO | Admitting: Physician Assistant

## 2022-12-14 VITALS — BP 131/83 | HR 67 | Resp 20 | Ht 68.0 in | Wt 210.0 lb

## 2022-12-14 DIAGNOSIS — R413 Other amnesia: Secondary | ICD-10-CM

## 2022-12-14 DIAGNOSIS — Z8673 Personal history of transient ischemic attack (TIA), and cerebral infarction without residual deficits: Secondary | ICD-10-CM | POA: Diagnosis not present

## 2022-12-14 NOTE — Patient Instructions (Addendum)
It was a pleasure to see you today at our office.   Recommendations:  Neurocognitive evaluation at our office   MRA  of the brain, the radiology office will call you to arrange you appointment   30 day Holter monitor  Keep taking memantine and donepezil     For assessment of decision of mental capacity and competency:  Call Dr. Erick Blinks, geriatric psychiatrist at [phone] Counseling regarding caregiver distress, including caregiver depression, anxiety and issues regarding community resources, adult day care programs, adult living facilities, or memory care questions:  please contact your  Primary Doctor's Social Worker  Whom to call: Memory  decline, memory medications: Call our office [phone]  For psychiatric meds, mood meds: Please have your primary care physician manage these medications.  If you have any severe symptoms of a stroke, or other severe issues such as confusion,severe chills or fever, etc call 911 or go to the ER as you may need to be evaluated further    RECOMMENDATIONS FOR ALL PATIENTS WITH MEMORY PROBLEMS: 1. Continue to exercise (Recommend 30 minutes of walking everyday, or 3 hours every week) 2. Increase social interactions - continue going to [place] and enjoy social gatherings with friends and family 3. Eat healthy, avoid fried foods and eat more fruits and vegetables 4. Maintain adequate blood pressure, blood sugar, and blood cholesterol level. Reducing the risk of stroke and cardiovascular disease also helps promoting better memory. 5. Avoid stressful situations. Live a simple life and avoid aggravations. Organize your time and prepare for the next day in anticipation. 6. Sleep well, avoid any interruptions of sleep and avoid any distractions in the bedroom that may interfere with adequate sleep quality 7. Avoid sugar, avoid sweets as there is a strong link between excessive sugar intake, diabetes, and cognitive impairment We discussed the  Mediterranean diet, which has been shown to help patients reduce the risk of progressive memory disorders and reduces cardiovascular risk. This includes eating fish, eat fruits and green leafy vegetables, nuts like almonds and hazelnuts, walnuts, and also use olive oil. Avoid fast foods and fried foods as much as possible. Avoid sweets and sugar as sugar use has been linked to worsening of memory function.  There is always a concern of gradual progression of memory problems. If this is the case, then we may need to adjust level of care according to patient needs. Support, both to the patient and caregiver, should then be put into place.      You have been referred for a neuropsychological evaluation (i.e., evaluation of memory and thinking abilities). Please bring someone with you to this appointment if possible, as it is helpful for the doctor to hear from both you and another adult who knows you well. Please bring eyeglasses and hearing aids if you wear them.    The evaluation will take approximately 3 hours and has two parts:   The first part is a clinical interview with the neuropsychologist (Dr. Milbert Coulter or Dr. Roseanne Reno). During the interview, the neuropsychologist will speak with you and the individual you brought to the appointment.    The second part of the evaluation is testing with the doctor's technician Annabelle Harman or Selena Batten). During the testing, the technician will ask you to remember different types of material, solve problems, and answer some questionnaires. Your family member will not be present for this portion of the evaluation.   Please note: We must reserve several hours of the neuropsychologist's time and the psychometrician's time for your  evaluation appointment. As such, there is a No-Show fee of $100. If you are unable to attend any of your appointments, please contact our office as soon as possible to reschedule.    FALL PRECAUTIONS: Be cautious when walking. Scan the area for obstacles  that may increase the risk of trips and falls. When getting up in the mornings, sit up at the edge of the bed for a few minutes before getting out of bed. Consider elevating the bed at the head end to avoid drop of blood pressure when getting up. Walk always in a well-lit room (use night lights in the walls). Avoid area rugs or power cords from appliances in the middle of the walkways. Use a walker or a cane if necessary and consider physical therapy for balance exercise. Get your eyesight checked regularly.  FINANCIAL OVERSIGHT: Supervision, especially oversight when making financial decisions or transactions is also recommended.  HOME SAFETY: Consider the safety of the kitchen when operating appliances like stoves, microwave oven, and blender. Consider having supervision and share cooking responsibilities until no longer able to participate in those. Accidents with firearms and other hazards in the house should be identified and addressed as well.   ABILITY TO BE LEFT ALONE: If patient is unable to contact 911 operator, consider using LifeLine, or when the need is there, arrange for someone to stay with patients. Smoking is a fire hazard, consider supervision or cessation. Risk of wandering should be assessed by caregiver and if detected at any point, supervision and safe proof recommendations should be instituted.  MEDICATION SUPERVISION: Inability to self-administer medication needs to be constantly addressed. Implement a mechanism to ensure safe administration of the medications.   DRIVING: Regarding driving, in patients with progressive memory problems, driving will be impaired. We advise to have someone else do the driving if trouble finding directions or if minor accidents are reported. Independent driving assessment is available to determine safety of driving.   If you are interested in the driving assessment, you can contact the following:  [facility] [employer] in [place]  [phone]  [facility] [phone]  [facility] [phone] or [phone]    Mediterranean Diet A Mediterranean diet refers to food and lifestyle choices that are based on the traditions of countries located on the [employer]. This way of eating has been shown to help prevent certain conditions and improve outcomes for people who have chronic diseases, like kidney disease and heart disease. What are tips for following this plan? Lifestyle  Cook and eat meals together with your family, when possible. Drink enough fluid to keep your urine clear or pale yellow. Be physically active every day. This includes: Aerobic exercise like running or swimming. Leisure activities like gardening, walking, or housework. Get 7-8 hours of sleep each night. If recommended by your health care provider, drink red wine in moderation. This means 1 glass a day for nonpregnant women and 2 glasses a day for men. A glass of wine equals 5 oz (150 mL). Reading food labels  Check the serving size of packaged foods. For foods such as rice and pasta, the serving size refers to the amount of cooked product, not dry. Check the total fat in packaged foods. Avoid foods that have saturated fat or trans fats. Check the ingredients list for added sugars, such as corn syrup. Shopping  At the grocery store, buy most of your food from the areas near the walls of the store. This includes:  Fresh fruits and vegetables (produce). Grains, beans, nuts, and seeds. Some of these may be available in unpackaged forms or large amounts (in bulk). Fresh seafood. Poultry and eggs. Low-fat dairy products. Buy whole ingredients instead of prepackaged foods. Buy fresh fruits and vegetables in-season from local farmers markets. Buy frozen fruits and vegetables in resealable bags. If you do not have access to quality fresh seafood, buy precooked frozen shrimp or canned  fish, such as tuna, salmon, or sardines. Buy small amounts of raw or cooked vegetables, salads, or olives from the deli or salad bar at your store. Stock your pantry so you always have certain foods on hand, such as olive oil, canned tuna, canned tomatoes, rice, pasta, and beans. Cooking  Cook foods with extra-virgin olive oil instead of using butter or other vegetable oils. Have meat as a side dish, and have vegetables or grains as your main dish. This means having meat in small portions or adding small amounts of meat to foods like pasta or stew. Use beans or vegetables instead of meat in common dishes like chili or lasagna. Experiment with different cooking methods. Try roasting or broiling vegetables instead of steaming or sauteing them. Add frozen vegetables to soups, stews, pasta, or rice. Add nuts or seeds for added healthy fat at each meal. You can add these to yogurt, salads, or vegetable dishes. Marinate fish or vegetables using olive oil, lemon juice, garlic, and fresh herbs. Meal planning  Plan to eat 1 vegetarian meal one day each week. Try to work up to 2 vegetarian meals, if possible. Eat seafood 2 or more times a week. Have healthy snacks readily available, such as: Vegetable sticks with hummus. Greek yogurt. Fruit and nut trail mix. Eat balanced meals throughout the week. This includes: Fruit: 2-3 servings a day Vegetables: 4-5 servings a day Low-fat dairy: 2 servings a day Fish, poultry, or lean meat: 1 serving a day Beans and legumes: 2 or more servings a week Nuts and seeds: 1-2 servings a day Whole grains: 6-8 servings a day Extra-virgin olive oil: 3-4 servings a day Limit red meat and sweets to only a few servings a month What are my food choices? Mediterranean diet Recommended Grains: Whole-grain pasta. Brown rice. Bulgar wheat. Polenta. Couscous. Whole-wheat bread. Orpah Cobb. Vegetables: Artichokes. Beets. Broccoli. Cabbage. Carrots. Eggplant. Green  beans. Chard. Kale. Spinach. Onions. Leeks. Peas. Squash. Tomatoes. Peppers. Radishes. Fruits: Apples. Apricots. Avocado. Berries. Bananas. Cherries. Dates. Figs. Grapes. Lemons. Melon. Oranges. Peaches. Plums. Pomegranate. Meats and other protein foods: Beans. Almonds. Sunflower seeds. Pine nuts. Peanuts. Cod. Salmon. Scallops. Shrimp. Tuna. Tilapia. Clams. Oysters. Eggs. Dairy: Low-fat milk. Cheese. Greek yogurt. Beverages: Water. Red wine. Herbal tea. Fats and oils: Extra virgin olive oil. Avocado oil. Grape seed oil. Sweets and desserts: [place] yogurt with honey. Baked apples. Poached pears. Trail mix. Seasoning and other foods: Basil. Cilantro. Coriander. Cumin. Mint. Parsley. Sage. Rosemary. Tarragon. Garlic. Oregano. Thyme. Pepper. Balsalmic vinegar. Tahini. Hummus. Tomato sauce. Olives. Mushrooms. Limit these Grains: Prepackaged pasta or rice dishes. Prepackaged cereal with added sugar. Vegetables: Deep fried potatoes (french fries). Fruits: Fruit canned in syrup. Meats and other protein foods: Beef. Pork. Lamb. Poultry with skin. Hot dogs. Tomasa Blase. Dairy: Ice cream. Sour cream. Whole milk. Beverages: Juice. Sugar-sweetened soft drinks. Beer. Liquor and spirits. Fats and oils: Butter. Canola oil. Vegetable oil. Beef fat (tallow). Lard. Sweets and desserts: Cookies. Cakes. Pies. Candy. Seasoning and other foods: Mayonnaise. Premade sauces and marinades. The items listed may not be a complete  list. Talk with your dietitian about what dietary choices are right for you. Summary The Mediterranean diet includes both food and lifestyle choices. Eat a variety of fresh fruits and vegetables, beans, nuts, seeds, and whole grains. Limit the amount of red meat and sweets that you eat. Talk with your health care provider about whether it is safe for you to drink red wine in moderation. This means 1 glass a day for nonpregnant women and 2 glasses a day for men. A glass of wine equals 5 oz (150  mL). This information is not intended to replace advice given to you by your health care provider. Make sure you discuss any questions you have with your health care provider. Document Released: 01/14/2016 Document Revised: 02/16/2016 Document Reviewed: 01/14/2016 Elsevier Interactive Patient Education  2017 [employer].   [address] Mri at [facility] [phone]

## 2022-12-14 NOTE — Progress Notes (Signed)
Assessment/Plan:   Mild Cognitive Impairment    Dennis [name] is a very pleasant 77 y.o. 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  Performed on 12/05/2022 these were personally reviewed, remarkable for punctate subacute cortical infarct in the posterior left frontal lobe, as well as moderate chronic microvascular ischemic changes of the white matter, multiple remote infarcts and moderate parenchymal volume loss.  Patient is on baby aspirin Pravachol, and blood pressure medications for secondary stroke prevention. Plavix is being held by Cards for a total of 6 weeks as he is in a new prostate med by Urology, Carotid US is unremarkable, with patent carotids. 2 D echo EF 50-55% with Gr I LVF.Marland Kitchen IN view of the abnormal MRI findings, will perform MRA head to complete the visualization of brain vessels ( he has kidney disease, unable to perform CTA with contrast.   Patient is currently on donepezil 5 mg daily (diarrhea with 10 mg) and memantine 5 mg daily (due to dizziness with twice daily).  He was last seen on 10/13/2022 at which time MoCA was 12/30.      Follow up in 6  months. Recommend MRA head  due to abnormal MRI of the brain  Continue secondary stroke prevention, continue  baby aspirin, resuming Plavix in 6 weeks . Patient is scheduled for neuropsych evaluation on October 2024 for clarity of diagnosis  Replenish B12 (268) Recommend good control of cardiovascular risk factors 30 day Holter monitor in view of subacute and chronic strokes, to rule out cardioembolic source of cerebral infarcts  Continue to control mood as per PCP     Initial visit 10/13/2022    How long did patient have memory difficulties?  For at least 2 or 3 years.  He initially was evaluated by his PCP, and around March 2023.  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 He was having a hard time writing numbers in a check. Most of the time if listening carefully he is able to retain conversations and names. No speech difficulties. repeats oneself? Denies  Disoriented when walking into a room?  Patient denies except occasionally not remembering what patient came to the room for   Leaving objects in unusual places?   denies   Wandering behavior? denies   Any personality changes since last visit? denies   Any history of depression?:  he "may feel depressed butI don't have a diagnosis of depression"  Hallucinations or paranoia?  Sees people when standing at the window and tells his wife :  " in the woods , standing there'"  Seizures? denies    Any sleep changes?  Sleeps well.  Has + vivid dreams about his dog , and talks in the sleep "it is very real to me", +REM behavior, no sleepwalking   Sleep apnea? denies   Any hygiene concerns?  He has a schedule for showers  Independent of bathing and dressing?  He needs help with getting dressed, needs some instructions with simple tasks    Does the patient need help with medications? Patient is in charge   Who is in charge of the finances?  Wife is in charge after he was having difficulties writing checks.     Any changes in  appetite?   denies     Patient have trouble swallowing?  denies   Does the patient cook?  Wife does the cooking. Any headaches?  denies   Chronic back pain?  denies   Ambulates with difficulty? denies   Recent falls or head injuries? 4y ago he slipped from a boat trailer and hit the head on the trailer needing staples.    Vision changes? Cataracts and astigmatism  Unilateral weakness, numbness or tingling?  denies   Any tremors?  Occasionally, not often when picking an object.  Any anosmia?  "a little bit" more than 4 y ago  Any incontinence of urine? Endorsed,  OAB, has  to be reminded to go to the bathroom before" having an accident ". May need Depends at night  Any bowel dysfunction?    denies      Patient lives  wife  History of heavy alcohol intake? denies   History of heavy tobacco use? denies   Family history of dementia?  Mo with vascular dementia  and his sister with AD. Fa possible dementia ? type Does patient drive?  Short distances  Key note speaker and now he has trouble with words.    2D echo shows EF 50 to 55%, low normal left ventricle, borderline LVH, grade 1 DD   CURRENT MEDICATIONS:  Outpatient Encounter Medications as of 12/14/2022  Medication Sig   ascorbic acid (VITAMIN C) 1000 MG tablet Take 1,000 mg by mouth daily.   ASPIRIN 81 PO Take 81 mg by mouth daily.   b complex vitamins capsule Take 1 capsule by mouth daily.   carvedilol (COREG) 3.125 MG tablet Take 1 tablet (3.125 mg total) by mouth 2 (two) times daily.   Cholecalciferol (VITAMIN D) 50 MCG (2000 UT) tablet Take 2,000 Units by mouth daily.   clopidogrel (PLAVIX) 75 MG tablet Take 1 tablet by mouth once daily   donepezil (ARICEPT) 10 MG tablet Half tablet at bedtime for first month then increase to whole tablet   fenofibrate 160 MG tablet Take 160 mg by mouth daily.   ipratropium (ATROVENT) 0.03 % nasal spray Place 2 sprays into both nostrils 2 (two) times daily.   magnesium oxide (MAG-OX) 400 MG tablet Take 400 mg by mouth daily.   memantine (NAMENDA) 10 MG tablet Take 1 tablet (10 mg total) by mouth daily.   Misc Natural Products (NEURIVA PO) Take 1 tablet by mouth daily.   nitroGLYCERIN (NITROSTAT) 0.4 MG SL tablet DISSOLVE ONE TABLET UNDER THE TONGUE EVERY 5 MINUTES AS NEEDED FOR CHEST PAIN.   Omeprazole-Sodium Bicarbonate (ZEGERID) 20-1100 MG CAPS capsule Take 1 capsule by mouth daily before breakfast.   pravastatin (PRAVACHOL) 20 MG tablet Take 20 mg by mouth daily.   telmisartan (MICARDIS) 80 MG tablet Take 80 mg by mouth daily.   timolol (TIMOPTIC) 0.5 % ophthalmic  solution Place 1 drop into both eyes daily.   No facility-administered encounter medications on file as of 12/14/2022.        No data to display            10/13/2022    4:00 PM 08/31/2021    2:19 PM  Montreal Cognitive Assessment   Visuospatial/ Executive (0/5) 1 0  Naming (0/3) 3 3  Attention: Read list of digits (0/2) 2 1  Attention: Read list of letters (0/1) 1 1  Attention: Serial 7 subtraction starting at 100 (0/3) 0 1  Language: Repeat phrase (0/2) 0 2  Language : Fluency (0/1)  1 1  Abstraction (0/2) 1 2  Delayed Recall (0/5) 0 0  Orientation (0/6) 3 6  Total 12 17  Adjusted Score (based on education) 12 17   Thank you for allowing [place] the opportunity to participate in the care of this nice patient. Please do not hesitate to contact us for any questions or concerns.   Total time spent on today's visit was 37 minutes dedicated to this patient today, preparing to see patient, examining the patient, ordering tests and/or medications and counseling the patient, documenting clinical information in the EHR or other health record, independently interpreting results and communicating results to the patient/family, discussing treatment and goals, answering patient's questions and coordinating care.  Cc:  Georgianne Fick, MD  [name] 12/14/2022 7:11 AM

## 2022-12-19 ENCOUNTER — Telehealth: Payer: Self-pay | Admitting: Cardiovascular Disease

## 2022-12-19 DIAGNOSIS — I639 Cerebral infarction, unspecified: Secondary | ICD-10-CM

## 2022-12-19 NOTE — Telephone Encounter (Signed)
Patient's wife states [name], [place] of [facility] neurology faxed a monitor order to the office and she would like to confirm that it was received. Patient was seen on 7/10 and it should have been sent then. Please advise.

## 2022-12-19 NOTE — Telephone Encounter (Signed)
Spoke to the patient wife Dennis [name], [place]  on 7/10 pt had OV with Neurology 30 day Holter monitor was ordered to rule of cardioembolic. Pt wife stated they need assistance with placing the monitor, pt wife stated instructions for placement did not come with the device.  Pt was advised by Neurology to contact cardiology for any issues with the device. Will forward to MD and nurse for advise.

## 2022-12-20 NOTE — Telephone Encounter (Signed)
Left message for pt's wife, [name] to call back.

## 2022-12-20 NOTE — Telephone Encounter (Signed)
Spoke with pt's wife, [name] (ok per [facility]) regarding office visit at [facility] Neurology with [name], PA-C on 7/10. Per MRI, Huntley Dec states in her note that pt is having moderate chronic microvascular ischemic changes which possibly indicates multiple remote infarctions. It was recommended that the pt wear a 30 day monitor. Huntley Dec was sending over a referral to Dr. Allyson Sabal for [place] to order the monitor, however it looks like the referral was sent to [facility]. Explained to wife that I will send this message over to Dr. Allyson Sabal to advise. Wife verbalizes understanding.

## 2022-12-20 NOTE — Telephone Encounter (Signed)
Patient's wife is returning call. Transferred to [place], Charity fundraiser.

## 2022-12-21 ENCOUNTER — Ambulatory Visit: Payer: Medicare HMO | Admitting: Physician Assistant

## 2022-12-21 NOTE — Telephone Encounter (Signed)
Dennis Gess, MD  You7 hours ago (9:12 AM)    I saw the pt over a year ago. Not sure why [name] would refer my pt over to [facility] Cardiology. We're happy to arrange a 4 week Zio if they wish.     Spoke with pt's wife, [name] (ok per DPR). 30 day monitor ordered for pt per Dr. Allyson Sabal. Instructions reviewed with [name] and sent via Mychart. She will reach out to [place] with any questions or concerns.

## 2022-12-22 ENCOUNTER — Other Ambulatory Visit: Payer: Self-pay | Admitting: Cardiovascular Disease

## 2022-12-22 ENCOUNTER — Encounter: Payer: Self-pay | Admitting: *Deleted

## 2022-12-22 DIAGNOSIS — R42 Dizziness and giddiness: Secondary | ICD-10-CM

## 2022-12-22 DIAGNOSIS — I639 Cerebral infarction, unspecified: Secondary | ICD-10-CM

## 2022-12-22 DIAGNOSIS — I2510 Atherosclerotic heart disease of native coronary artery without angina pectoris: Secondary | ICD-10-CM

## 2022-12-22 NOTE — Progress Notes (Signed)
Patient enrolled for [employer]/ [employer] to ship a 30 day cardiac event monitor to his address on file.

## 2022-12-26 ENCOUNTER — Encounter: Payer: Self-pay | Admitting: Physician Assistant

## 2022-12-28 ENCOUNTER — Ambulatory Visit
Admission: RE | Admit: 2022-12-28 | Discharge: 2022-12-28 | Disposition: A | Discharge status: Home / Self Care | Payer: Medicare HMO | Source: Ambulatory Visit | Attending: Physician Assistant | Admitting: Physician Assistant

## 2022-12-28 DIAGNOSIS — R42 Dizziness and giddiness: Secondary | ICD-10-CM

## 2022-12-28 DIAGNOSIS — I2510 Atherosclerotic heart disease of native coronary artery without angina pectoris: Secondary | ICD-10-CM | POA: Diagnosis not present

## 2022-12-28 DIAGNOSIS — R930 Abnormal findings on diagnostic imaging of skull and head, not elsewhere classified: Secondary | ICD-10-CM | POA: Diagnosis not present

## 2022-12-28 DIAGNOSIS — I639 Cerebral infarction, unspecified: Secondary | ICD-10-CM

## 2023-01-06 ENCOUNTER — Telehealth: Payer: Self-pay | Admitting: Physician Assistant

## 2023-01-06 NOTE — Telephone Encounter (Signed)
Patients wife is calling for Results from 12/28/22 MRA

## 2023-01-09 NOTE — Telephone Encounter (Signed)
Patient advised of MRA results, voiced understanding and thanked me for calling.   ?

## 2023-01-10 NOTE — Progress Notes (Signed)
MRA head and neck, normal, no large vessel occlusion or narrowing.

## 2023-01-16 DIAGNOSIS — R350 Frequency of micturition: Secondary | ICD-10-CM | POA: Diagnosis not present

## 2023-01-20 DIAGNOSIS — N411 Chronic prostatitis: Secondary | ICD-10-CM | POA: Diagnosis not present

## 2023-01-20 DIAGNOSIS — R350 Frequency of micturition: Secondary | ICD-10-CM | POA: Diagnosis not present

## 2023-01-20 DIAGNOSIS — R3915 Urgency of urination: Secondary | ICD-10-CM | POA: Diagnosis not present

## 2023-01-20 DIAGNOSIS — R310 Gross hematuria: Secondary | ICD-10-CM | POA: Diagnosis not present

## 2023-01-23 ENCOUNTER — Ambulatory Visit: Payer: Medicare HMO | Admitting: Psychology

## 2023-01-23 ENCOUNTER — Encounter: Payer: Self-pay | Admitting: Psychology

## 2023-01-23 DIAGNOSIS — F0150 Vascular dementia without behavioral disturbance: Secondary | ICD-10-CM

## 2023-01-23 DIAGNOSIS — F0280 Dementia in other diseases classified elsewhere without behavioral disturbance: Secondary | ICD-10-CM

## 2023-01-23 DIAGNOSIS — I639 Cerebral infarction, unspecified: Secondary | ICD-10-CM | POA: Diagnosis not present

## 2023-01-23 DIAGNOSIS — G309 Alzheimer's disease, unspecified: Secondary | ICD-10-CM

## 2023-01-23 DIAGNOSIS — R4189 Other symptoms and signs involving cognitive functions and awareness: Secondary | ICD-10-CM

## 2023-01-23 HISTORY — DX: Dementia in other diseases classified elsewhere, unspecified severity, without behavioral disturbance, psychotic disturbance, mood disturbance, and anxiety: F02.80

## 2023-01-23 HISTORY — DX: Vascular dementia, unspecified severity, without behavioral disturbance, psychotic disturbance, mood disturbance, and anxiety: F01.50

## 2023-01-23 NOTE — Progress Notes (Signed)
   Psychometrician Note   Cognitive testing was administered to Dennis [name] by Wallace Keller, B.S. (psychometrist) under the supervision of Dr. Newman Nickels, Ph.D., licensed psychologist on 01/23/2023. Dennis [name] did not appear overtly distressed by the testing session per behavioral observation or responses across self-report questionnaires. Rest breaks were offered.    The battery of tests administered was selected by Dr. Newman Nickels, Ph.D. with consideration to Dennis [name] current level of functioning, the nature of his symptoms, emotional and behavioral responses during interview, level of literacy, observed level of motivation/effort, and the nature of the referral question. This battery was communicated to the psychometrist. Communication between Dr. Newman Nickels, Ph.D. and the psychometrist was ongoing throughout the evaluation and Dr. Newman Nickels, Ph.D. was immediately accessible at all times. Dr. Newman Nickels, Ph.D. provided supervision to the psychometrist on the date of this service to the extent necessary to assure the quality of all services provided.    Dennis [name] will return within approximately 1-2 weeks for an interactive feedback session with Dr. Milbert Coulter at which time his test performances, clinical impressions, and treatment recommendations will be reviewed in detail. Dennis [name] understands he can contact our office should he require our assistance before this time.  A total of 85 minutes of billable time were spent face-to-face with Dennis [name] by the psychometrist. This includes both test administration and scoring time. Billing for these services is reflected in the clinical report generated by Dr. Newman Nickels, Ph.D.  This note reflects time spent with the psychometrician and does not include test scores or any clinical interpretations made by Dr. Milbert Coulter. The full report will follow in a separate note.

## 2023-01-23 NOTE — Progress Notes (Unsigned)
NEUROPSYCHOLOGICAL EVALUATION [place]. [facility] Department of Neurology  Date of Evaluation: January 23, 2023  Reason for Referral:   Dennis [name] is a 77 y.o. right-handed Caucasian male referred by [name], PA-C, to characterize his current cognitive functioning and assist with diagnostic clarity and treatment planning in the context of subjective cognitive decline.   Assessment and Plan:   Clinical Impression(s): Dennis [name] pattern of performance is suggestive of diffuse cognitive impairment. A relative strength was exhibited across both basic attention and phonemic fluency (i.e., below average range) while some variability was noted across confrontation naming. However, severe cognitive impairment was exhibited across all other assessed cognitive domains. This includes processing speed, executive functioning, receptive language, semantic fluency, visuospatial abilities, and all aspects of learning and memory. Functionally, Dennis [name] denied all concerns. His wife was generally in agreement. This report is extremely surprising to me given the sheer severity of ongoing cognitive impairment. Despite their report of relative functional independence, I feel that there is a good likelihood that cognitive dysfunction is directly impacting day-to-day functioning and that Dennis [name] best meets diagnostic criteria for a Major Neurocognitive Disorder ("dementia") at the present time.  The etiology for his dementia presentation would appear mixed in nature. Concerns are reasonable surrounding underlying Alzheimer's disease. Across memory testing, Dennis [name] was fully amnestic (i.e., 0% retention) across both verbal memory tasks after a brief delay and performed poorly across a yes/no recognition trial. He further exhibited minimal retention across a visual memory task. This suggests evidence for rapid forgetting and a pronounced storage impairment,  both of which are the hallmark testing patterns for this illness. A prior brain MRI in April 2023 also revealed mild cerebral atrophy, said to be "a little more pronounced" in the medial temporal lobes, which is also a well established risk factor for Alzheimer's disease.   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 The sheer number of distinct strokes spanning numerous brain regions would further create notable cognitive impairment. Overall, a mixed dementia presentation (i.e., Alzheimer's disease with evidence for cerebrovascular disease/stroke) would appear to best explain ongoing deficits.   Recommendations: His wife reported that Dennis [name] had fairly recently been prescribed Gemtesa. This medication has anticholingeric properties, which means that side effects can include worsening cognitive impairment. Visual hallucinations are also a possible side effect. Given dementia concerns and that he has been experiencing hallucinations since around the time this medication was introduced, I would advise that he discuss the cessation of this medication with whomever has prescribed it.   Dennis [name] has already been prescribed medication aimed to address memory loss and concerns surrounding Alzheimer's disease (i.e., donepezil/Aricept and memantine/Namenda). He is encouraged to continue taking these medications as prescribed. It is important to highlight that this medication has been shown to slow functional decline in some individuals. There is no current treatment which can stop or reverse cognitive decline when caused by a neurodegenerative illness.   Given his degree of cognitive impairment across testing and his wife's report of him having trouble  staying in his lane while driving, I would strongly recommend that he fully abstain from all driving pursuits. Should his family wish to pursue a formalized driving evaluation, they could reach out to the following agencies: [employer]  [employer] in [place]: [phone] [facility]: [phone] [facility]: [phone] [facility]: [phone] or [phone]  It will be important for Dennis [name] to have another person with him when in situations where he may need to process information, weigh the pros and cons of different options, and make decisions, in order to ensure that he fully understands and recalls all information to be considered.  If not already done, Dennis [name] and his family may want to discuss his wishes regarding durable power of attorney and medical decision making, so that he can have input into these choices. If they require legal assistance with this, long-term care resource access, or other aspects of estate planning, they could reach out to [employer] at [phone] for a free consultation. Additionally, they may wish to discuss future plans for caretaking and seek out community options for in home/residential care should they become necessary.  Dennis [name] is encouraged to attend to lifestyle factors for brain health (e.g., regular physical exercise, good nutrition habits and consideration of the MIND-DASH diet, regular participation in cognitively-stimulating activities, and general stress management techniques), which are likely to have benefits for both emotional adjustment and cognition. Optimal control of vascular risk factors (including safe cardiovascular exercise and adherence to dietary recommendations) is encouraged. Continued participation in activities which provide mental stimulation and social interaction is also recommended.   Important information should be provided to Dennis [name] in written format in all  instances. This information should be placed in a highly frequented and easily visible location within his home to promote recall. External strategies such as written notes in a consistently used memory journal, visual and nonverbal auditory cues such as a calendar on the refrigerator or appointments with alarm, such as on a cell phone, can also help maximize recall.  To address problems with processing speed, he may wish to consider:   -Ensuring that he is alerted when essential material or instructions are being presented   -Adjusting the speed at which new information is presented   -Allowing for more time in comprehending, processing, and responding in conversation   -Repeating and paraphrasing instructions or conversations aloud  To address problems with fluctuating attention and/or executive dysfunction, he may wish to consider:   -Avoiding external distractions when needing to concentrate   -Limiting exposure to fast paced environments with multiple sensory demands   -Writing down complicated information and using checklists   -Attempting and completing one task at a time (i.e., no multi-tasking)   -Verbalizing aloud each step of a task to maintain focus   -Taking frequent breaks during the completion of steps/tasks to avoid fatigue   -Reducing the amount of information considered at one time   -Scheduling more difficult activities for a time of day where he is usually [name] alert  Review of Records:   Dennis [name] was seen by [facility] Windell Norfolk, M.D.) on 08/31/2021 for an evaluation of memory loss. At that time, difficulties were said to be present for the past year or so. Examples surrounded generalized forgetfulness and trouble performing mental calculations. He also reported repetition in day-to-day conversation. His PCP had placed him on Namenda 10mg  daily prior to this appointment. No ADL dysfunction was reported. Performance on a brief cognitive screening  instrument (MOCA) was 17/30. He was diagnosed with a dementia presentation based upon his MOCA performance.   He was seen by [facility] [name], PA-C) on 10/13/2022 for an evaluation of memory loss. Similar concerns  were noted, with difficulties being present and gradually worsening over the past 2-3 years. His wife had begin to take over some ADLs, especially financial management and bill paying at that time. He had also limited driving to short distances. Performance on the MOCA was 12/30. Ultimately, Dennis [name] was referred for a comprehensive neuropsychological evaluation to characterize his cognitive abilities and to assist with diagnostic clarity and treatment planning.   Neuroimaging Brain MRI on 09/19/2021 revealed mild generalized atrophy, "a little more pronounced" in the medial temporal lobes. It also revealed numerous small chronic infarctions in the cerebral hemispheres, right greater than left. No findings were said to be acute. Brain MRI on 12/05/2022 revealed a subacute infarct in the posterior left frontal lobe, as well as remote infarcts in the right postcentral gyrus, bilateral cerebellar hemispheres, left pons, left thalamus, and bilateral centrum semiovale and corona radiata. It also revealed moderate parenchymal volume loss.  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   Elevated PSA 11/27/2020   Enlarged prostate 12/12/2022   consulted about suspending  Clopidogrel for 6 weeks to treat Prostate inflammation w/ Meloxicam 15 MG once daily for 6 weeks   Essential hypertension 10/03/2014   Gastroesophageal reflux disease 11/27/2020   Hyperlipidemia    Hypomagnesemia 11/27/2020   Hypothyroidism    Myocardial infarction 03/2001   Pedal edema 11/27/2020   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   Prediabetes 11/27/2020   RBBB (right bundle branch block) 10/03/2014   Squamous cell carcinoma of scalp    "some burned, some cut off" (11/17/2016)   Thyroid nodule, hot    s/p radioactive iodine, now hypothyroidism   Unstable angina 09/30/2014   Vitamin D deficiency 11/27/2020    Past Surgical History:  Procedure Laterality Date   BYPASS GRAFT POPLITEAL TO POPLITEAL Right 11/30/2018   Procedure: RIGHT POPLITEAL ARTERY ANEURYSM REPAIR WITH ABOVE KNEE POPLITEAL TO BELOW KNEE POPLITEAL BY PASS GRAFT USING SAPPEHNOUS VEIN;  Surgeon: Larina Earthly, MD;  Location: [facility] OR;  Service: Vascular;  Laterality: Right;   CARDIAC CATHETERIZATION  09/2014   "day before they put the stent in"   CORONARY ANGIOPLASTY WITH STENT PLACEMENT  03/2001; 09/2014; 11/17/2016   "1+1+1"   CORONARY PRESSURE/FFR STUDY N/A 11/17/2016   Procedure: Intravascular Pressure Wire/FFR Study;  Surgeon: Runell Gess, MD;  Location: [facility] INVASIVE CV LAB;  Service: Cardiovascular;  Laterality: N/A;   CORONARY STENT INTERVENTION N/A 11/17/2016   Procedure: Coronary Stent Intervention;  Surgeon: Runell Gess, MD;  Location: [facility] INVASIVE CV LAB;  Service: Cardiovascular;  Laterality: N/A;   LEFT HEART CATH AND CORONARY ANGIOGRAPHY N/A 11/17/2016   Procedure: Left Heart Cath and Coronary Angiography;  Surgeon: Runell Gess, MD;  Location: [facility] INVASIVE CV LAB;  Service: Cardiovascular;  Laterality: N/A;   LEFT HEART CATHETERIZATION WITH CORONARY ANGIOGRAM N/A 09/30/2014   Procedure: LEFT HEART CATHETERIZATION WITH CORONARY ANGIOGRAM;  Surgeon: Lennette Bihari, MD;  Location: [facility] CATH  LAB;  Service: Cardiovascular;  Laterality: N/A;   PERCUTANEOUS CORONARY STENT INTERVENTION (PCI-S) N/A 10/02/2014   Procedure: PERCUTANEOUS CORONARY STENT INTERVENTION (PCI-S);  Surgeon: Kathleene Hazel, MD;  Location: [facility] CATH LAB;  Service: Cardiovascular;  Laterality: N/A;   SQUAMOUS CELL CARCINOMA EXCISION     "scalp"   WISDOM TOOTH EXTRACTION      Current Outpatient Medications:    ascorbic acid (VITAMIN C) 1000 MG tablet, Take 1,000 mg by mouth daily., Disp: , Rfl:    ASPIRIN 81 PO, Take 81 mg by mouth daily., Disp: , Rfl:    b complex vitamins capsule, Take 1 capsule by mouth daily., Disp: , Rfl:    carvedilol (COREG) 3.125 MG tablet, Take 1 tablet (3.125 mg total) by mouth 2 (two) times daily., Disp: 180 tablet, Rfl: 3   Cholecalciferol (VITAMIN D) 50 MCG (2000 UT) tablet, Take 2,000 Units by mouth daily., Disp: , Rfl:    clopidogrel (PLAVIX) 75 MG tablet, Take 1 tablet by mouth once daily, Disp: 90 tablet, Rfl: 3   donepezil (ARICEPT) 10 MG tablet, Half tablet at bedtime for first month then increase to whole tablet, Disp: , Rfl:    fenofibrate 160 MG tablet, Take 160 mg by mouth daily., Disp: , Rfl:    ipratropium (ATROVENT) 0.03 % nasal spray, Place 2 sprays into both nostrils 2 (two) times daily., Disp: , Rfl:    magnesium oxide (MAG-OX) 400 MG tablet, Take 400 mg by mouth daily., Disp: , Rfl:    memantine (NAMENDA) 10 MG tablet, Take 1 tablet (10 mg total) by mouth daily., Disp: 30 tablet, Rfl: 11   Misc Natural Products (NEURIVA PO), Take 1 tablet by mouth daily., Disp: , Rfl:    nitroGLYCERIN (NITROSTAT) 0.4 MG SL tablet, DISSOLVE ONE TABLET UNDER THE TONGUE EVERY 5 MINUTES AS NEEDED FOR CHEST PAIN., Disp: 25 tablet, Rfl: 0   Omeprazole-Sodium Bicarbonate (ZEGERID) 20-1100 MG CAPS capsule, Take 1 capsule by mouth daily before breakfast., Disp: , Rfl:    pravastatin (PRAVACHOL) 20 MG tablet, Take 20 mg by mouth daily., Disp: , Rfl:    telmisartan (MICARDIS) 80 MG tablet,  Take 80 mg by mouth daily., Disp: , Rfl:    timolol (TIMOPTIC) 0.5 % ophthalmic solution, Place 1 drop into both eyes daily., Disp: , Rfl:   Clinical Interview:   The following information was obtained during a clinical interview with Dennis [name] and his wife prior to cognitive testing.  Cognitive Symptoms: Decreased short-term memory: Endorsed. He described generalized forgetfulness. With direct questioning, he reported trouble recalling recent conversations, recalling names of familiar individuals, trouble recalling numbers (e.g., passcodes for the alarm or his phone), and misplacing/losing things. His wife was in agreement. She generally denied increased repetition in day-to-day conversation. Difficulties have been present for the past several years and do seem to have progressively worsened over time.  Decreased long-term memory: Denied. Decreased attention/concentration: Endorsed. They reported trouble with sustained attention and increased distractibility.  Reduced processing speed: Endorsed. Difficulties with executive functions: Endorsed. They reported trouble with organization, multi-tasking, and decision making. They denied trouble with impulsivity or any significant personality changes.  Difficulties with emotion regulation: Denied. Difficulties with receptive language: Denied. Difficulties with word finding: Endorsed. His wife also reported some mumbling while speaking and potential hypophonia.  Decreased visuoperceptual ability: Denied.  Difficulties completing ADLs: Largely denied. When asked about medication and financial management, his wife commented that he is "good with that" and generally denied ongoing  concerns. She did acknowledge driving concerns in that he has a harder time staying in his lane and may accidentally cross into other lanes while driving. She denied prominent navigational difficulties.   Additional Medical History: History of traumatic brain  injury/concussion: About 4-5 years prior, they reported that he slipped and fell on a dock, striking his head on a Financial planner. He required some stiches to close a head wound but they denied him being formally diagnosed with a concussion. No more recent head injuries were described.  History of stroke: Endorsed (see above).  History of seizure activity: Denied. History of known exposure to toxins: Denied. Symptoms of chronic pain: Denied. Experience of frequent headaches/migraines: Denied. Frequent instances of dizziness/vertigo: Endorsed. Symptoms were said to occur both when standing quickly, as well as at seemingly random points of time. His wife wondered if these experiences may represent medication side effects.   Sensory changes: He wears glasses with benefit. He also reported a diminished sense of both taste and smell. No hearing related difficulties were reported.  Balance/coordination difficulties: Denied. This past Christmas he fell after tripping over a footstool that had been moved. Outside of this, no recent falls were described. His wife did mention a more recent occurrence where he slowly slid off the side of the bed. One side of the body was not said to be weaker than the other.  Other motor difficulties: They described rare tremors when performing a fine motor action such as pouring a liquid.   Sleep History: Estimated hours obtained each night: Unclear.  Difficulties falling asleep: Denied. Difficulties staying asleep: Denied. His wife noted that Dennis [name] had lately become an extremely heavy sleeper to the extent that he will experience urinary incontinence due to him not being able to wake during the night. She had resorted to setting an alarm to go off every couple hours so that he can use the restroom. However, there are times where she has been unable to wake him.  Feels rested and refreshed upon awakening: Endorsed.  History of snoring: Endorsed. History of waking up  gasping for air: Denied. Witnessed breath cessation while asleep: Denied.  History of vivid dreaming: Endorsed. His wife noted a history of Dennis [name] talking or yelling in his sleep.  Excessive movement while asleep: Denied. Instances of acting out his dreams: Denied.  Psychiatric/Behavioral Health History: Depression: He described his current mood as "up and down." Down periods were related to general stress surrounding physical and cognitive decline. He denied to his knowledge any prior mental health concerns or formal diagnoses. Current or remote suicidal ideation, intent, or plan was denied.  Anxiety: Denied. Mania: Denied. Trauma History: Denied. Visual/auditory hallucinations: Endorsed. Over the past several months, his wife noted Dennis [name] likely experiencing visual hallucinations. Examples included him seeing fully formed people in the woods or outside their window. Notably, his wife stated that Dennis [name] had started a new medication roughly around this time Leslye Peer) which can have visual hallucinations as a potential side effect.  Delusional thoughts: He described increased paranoia stemming from hallucination experiences and concern that individuals may break into their home.   Tobacco: Denied. Alcohol: He denied current alcohol consumption as well as a history of problematic alcohol abuse or dependence.  Recreational drugs: Denied.  Family History: Problem Relation Age of Onset   Alzheimer's disease Mother    Dementia Mother    Dementia Sister    Alzheimer's disease Sister    This information was confirmed by Mr. Mosteller.  Academic/Vocational History: Highest level of educational attainment: 16 years. He graduated from high school and earned a Oncologist in Nurse, children's. He described himself as an average (B/C) student in academic settings. No relative weaknesses were reported.  History of developmental delay: Denied. History of grade repetition:  Denied. Enrollment in special education courses: Denied. History of LD/ADHD: Denied.  Employment: Retired. 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   Evaluation Results:   Behavioral Observations: Mr. Necessary was accompanied by his wife, arrived to his appointment on time, and was appropriately dressed and groomed. He appeared alert. However, there were two distinct instances during interview where he was noted to zone out and stare off before being re-oriented by his wife. Observed gait and station were within normal limits. Gross motor functioning appeared intact upon informal observation and no abnormal movements (e.g., tremors) were noted. His affect was generally relaxed and positive. Spontaneous speech was fluent and word finding difficulties were not observed during the clinical interview. Thought processes were coherent, organized, and normal in content. Insight into his cognitive difficulties appeared poor. His wife generally provided all details surrounding day-to-day functioning and I do not believe Mr. Demmons has full insight into the extent of ongoing dysfunction.   During testing, sustained attention was appropriate. Task engagement was adequate and he persisted when challenged. Overall, Mr. Leathem was cooperative with the clinical interview and subsequent testing procedures.   Adequacy of Effort: The validity of neuropsychological testing is limited by the extent to which the individual being tested may be assumed to have exerted adequate effort during testing. Mr. Pasion expressed his intention to perform to the best of his abilities and exhibited adequate task engagement and persistence. Scores across stand-alone and embedded performance validity measures were below expectation. However, this is believed to be due to true and quite severe cognitive impairment rather than poor engagement or attempts to  perform poorly. As such, the results of the current evaluation are believed to be a valid representation of Mr. Theroux current cognitive functioning.  Test Results: Mr. Vitali was poorly oriented at the time of the current evaluation. He incorrectly stated his phone number. He was also unable to state the current year, date, time, or name of the clinic.   Intellectual abilities based upon educational and vocational attainment were estimated to be in the average range. Premorbid abilities were estimated to be within the average range based upon a single-word reading test.   Processing speed was exceptionally low. Basic attention was below average. More complex attention (e.g., working memory) was exceptionally low. Executive functioning was exceptionally low.  While not directly assessed, receptive language abilities were believed to be poor. Mr. Nardiello exhibited significant difficulties understanding task instructions at numerous time points throughout the evaluation even with repeated and non-standard instructions being provided. Assessed expressive language was mildly variable. Phonemic fluency was below average, semantic fluency was exceptionally low, and confrontation naming was average across a screening task but exceptionally low across a more comprehensive task.      Assessed visuospatial/visuoconstructional abilities were exceptionally low. When drawing a clock, he duplicated many numbers, ultimately including the numbers 12-7 twice. The number 12 was then included a third time. Numerical placement was abnormal, with no numbers being placed in the upper left quadrant of the clock. He exhibited confusion and was unable to place the clock hands. When copying a complex figure, he exhibited significant visual distortions across nearly all drawn  aspects and fully omitted numerous others.    Learning (i.e., encoding) of novel verbal information was exceptionally low. Spontaneous delayed  recall (i.e., retrieval) of previously learned information was exceptionally low. Performance across recognition tasks was exceptionally low, suggesting negligible evidence for information consolidation.  Results of emotional screening instruments suggested that recent symptoms of generalized anxiety were in the moderate range, while symptoms of depression were in the mild range. A screening instrument assessing recent sleep quality suggested the presence of minimal sleep dysfunction.  Tables of Scores:   Note: This summary of test scores accompanies the interpretive report and should not be considered in isolation without reference to the appropriate sections in the text. Descriptors are based on appropriate normative data and may be adjusted based on clinical judgment. Terms such as "Within Normal Limits" and "Outside Normal Limits" are used when a more specific description of the test score cannot be determined.       Percentile - Normative Descriptor > 98 - Exceptionally High 91-97 - Well Above Average 75-90 - Above Average 25-74 - Average 9-24 - Below Average 2-8 - Well Below Average < 2 - Exceptionally Low       Validity:   DESCRIPTOR       DCT: Discontinued (confusion) --- ---  RBANS EI: --- --- Outside Normal Limits       Orientation:      Raw Score Percentile   NAB Orientation, Form 1 20/29 --- ---       Cognitive Screening:      Raw Score Percentile   SLUMS: 7/30 --- ---       RBANS, Form A: Standard Score/ Scaled Score Percentile   Total Score --- --- ---  Immediate Memory 40 <1 Exceptionally Low    List Learning 1 <1 Exceptionally Low    Story Memory 1 <1 Exceptionally Low  Visuospatial/Constructional --- --- ---    Figure Copy 1 <1 Exceptionally Low    Line Orientation Discontinued (confusion) --- Impaired  Language 74 4 Well Below Average    Picture Naming 10/10 51-75 Average    Semantic Fluency 1 <1 Exceptionally Low  Attention --- --- ---    Digit Span 7 16  Below Average    Coding Discontinued (confusion) --- Impaired  Delayed Memory 40 <1 Exceptionally Low    List Recall 0/10 <2 Exceptionally Low    List Recognition 12/20 <2 Exceptionally Low    Story Recall 1 <1 Exceptionally Low    Figure Recall 3 1 Exceptionally Low       Intellectual Functioning:      Standard Score Percentile   Test of Premorbid Functioning: 93 32 Average       Attention/Executive Function:     Trail Making Test (TMT): Raw Score (T Score) Percentile     Part A Discontinued (confusion) --- Impaired    Part B Discontinued (confusion) --- Impaired        Language:     Verbal Fluency Test: Raw Score (T Score) Percentile     Phonemic Fluency (FAS) 29 (42) 21 Below Average    Animal Fluency 5 (14) <1 Exceptionally Low        NAB Language Module, Form 1: T Score Percentile     Naming 24/31 (23) <1 Exceptionally Low       Visuospatial/Visuoconstruction:      Raw Score Percentile   Clock Drawing: 3/10 --- Impaired       Mood and Personality:  Raw Score Percentile   PROMIS Depression Questionnaire: 19 --- Mild  PROMIS Anxiety Questionnaire: 21 --- Moderate       Additional Questionnaires:      Raw Score Percentile   PROMIS Sleep Disturbance Questionnaire: 15 --- None to Slight   Informed Consent and Coding/Compliance:   The current evaluation represents a clinical evaluation for the purposes previously outlined by the referral source and is in no way reflective of a forensic evaluation.   Mr. Socarras was provided with a verbal description of the nature and purpose of the present neuropsychological evaluation. Also reviewed were the foreseeable risks and/or discomforts and benefits of the procedure, limits of confidentiality, and mandatory reporting requirements of this provider. The patient was given the opportunity to ask questions and receive answers about the evaluation. Oral consent to participate was provided by the patient.   This evaluation was  conducted by Newman Nickels, Ph.D., ABPP-CN, board certified clinical neuropsychologist. Mr. Hollands completed a clinical interview with Dr. Milbert Coulter, billed as one unit [phone], and 85 minutes of cognitive testing and scoring, billed as one unit [phone] and two additional units [id number]. Psychometrist Wallace Keller, B.S. assisted Dr. Milbert Coulter with test administration and scoring procedures. As a separate and discrete service, one unit [id number] and two units [phone] were billed for Dr. Tammy Sours time spent in interpretation and report writing.

## 2023-01-24 ENCOUNTER — Encounter: Payer: Self-pay | Admitting: Psychology

## 2023-01-28 ENCOUNTER — Other Ambulatory Visit: Payer: Self-pay | Admitting: Cardiovascular Disease

## 2023-01-30 ENCOUNTER — Ambulatory Visit: Payer: Medicare HMO | Admitting: Psychology

## 2023-01-30 DIAGNOSIS — I639 Cerebral infarction, unspecified: Secondary | ICD-10-CM

## 2023-01-30 DIAGNOSIS — G309 Alzheimer's disease, unspecified: Secondary | ICD-10-CM | POA: Diagnosis not present

## 2023-01-30 DIAGNOSIS — F0280 Dementia in other diseases classified elsewhere without behavioral disturbance: Secondary | ICD-10-CM

## 2023-01-30 DIAGNOSIS — F0150 Vascular dementia without behavioral disturbance: Secondary | ICD-10-CM | POA: Diagnosis not present

## 2023-01-30 NOTE — Progress Notes (Signed)
   Neuropsychology Feedback Session Dennis [name]. [facility] [place] Department of Neurology  Reason for Referral:   Dennis [name] is a 77 y.o. right-handed Caucasian male referred by [name], PA-C, to characterize his current cognitive functioning and assist with diagnostic clarity and treatment planning in the context of subjective cognitive decline.   Feedback:   [name] completed a comprehensive neuropsychological evaluation on 01/23/2023. Please refer to that encounter for the full report and recommendations. Briefly, results suggested diffuse cognitive impairment. A relative strength was exhibited across both basic attention and phonemic fluency (i.e., below average range) while some variability was noted across confrontation naming. However, severe cognitive impairment was exhibited across all other assessed cognitive domains. This includes processing speed, executive functioning, receptive language, semantic fluency, visuospatial abilities, and all aspects of learning and memory. The etiology for his dementia presentation would appear mixed in nature. Concerns are reasonable surrounding underlying Alzheimer's disease. Across memory testing, [name] was fully amnestic (i.e., 0% retention) across both verbal memory tasks after a brief delay and performed poorly across a yes/no recognition trial. He further exhibited minimal retention across a visual memory task. This suggests evidence for rapid forgetting and a pronounced storage impairment, both of which are the hallmark testing patterns for this illness. A prior brain MRI in April 2023 also revealed mild cerebral atrophy, said to be "a little more pronounced" in the medial temporal lobes, which is also a well established risk factor for Alzheimer's disease. 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 The sheer number of distinct strokes spanning numerous brain regions would further create notable cognitive impairment. Overall, a mixed dementia presentation (i.e., Alzheimer's disease with evidence for cerebrovascular disease/stroke) would appear to best explain ongoing deficits.   [name] was accompanied by his wife during the current feedback session. Content of the current session focused on the results of his neuropsychological evaluation. [name] was given the opportunity to ask questions and his questions were answered. He was encouraged to reach out should additional questions arise. A copy of his report was provided at the conclusion of the visit.      One unit [phone] was billed for Dr. Tammy Sours time spent preparing for, conducting, and documenting the current feedback session with [name].

## 2023-01-31 ENCOUNTER — Ambulatory Visit: Payer: Medicare HMO | Attending: Cardiovascular Disease

## 2023-01-31 DIAGNOSIS — I639 Cerebral infarction, unspecified: Secondary | ICD-10-CM

## 2023-01-31 DIAGNOSIS — E782 Mixed hyperlipidemia: Secondary | ICD-10-CM | POA: Diagnosis not present

## 2023-01-31 DIAGNOSIS — R7309 Other abnormal glucose: Secondary | ICD-10-CM | POA: Diagnosis not present

## 2023-01-31 DIAGNOSIS — N1831 Chronic kidney disease, stage 3a: Secondary | ICD-10-CM | POA: Diagnosis not present

## 2023-01-31 DIAGNOSIS — I2510 Atherosclerotic heart disease of native coronary artery without angina pectoris: Secondary | ICD-10-CM | POA: Diagnosis not present

## 2023-01-31 DIAGNOSIS — L0390 Cellulitis, unspecified: Secondary | ICD-10-CM | POA: Diagnosis not present

## 2023-01-31 DIAGNOSIS — I129 Hypertensive chronic kidney disease with stage 1 through stage 4 chronic kidney disease, or unspecified chronic kidney disease: Secondary | ICD-10-CM | POA: Diagnosis not present

## 2023-01-31 DIAGNOSIS — R42 Dizziness and giddiness: Secondary | ICD-10-CM

## 2023-02-07 DIAGNOSIS — G301 Alzheimer's disease with late onset: Secondary | ICD-10-CM | POA: Diagnosis not present

## 2023-02-07 DIAGNOSIS — I2510 Atherosclerotic heart disease of native coronary artery without angina pectoris: Secondary | ICD-10-CM | POA: Diagnosis not present

## 2023-02-07 DIAGNOSIS — E039 Hypothyroidism, unspecified: Secondary | ICD-10-CM | POA: Diagnosis not present

## 2023-02-07 DIAGNOSIS — R600 Localized edema: Secondary | ICD-10-CM | POA: Diagnosis not present

## 2023-02-07 DIAGNOSIS — F0280 Dementia in other diseases classified elsewhere without behavioral disturbance: Secondary | ICD-10-CM | POA: Diagnosis not present

## 2023-02-07 DIAGNOSIS — E782 Mixed hyperlipidemia: Secondary | ICD-10-CM | POA: Diagnosis not present

## 2023-02-07 DIAGNOSIS — I129 Hypertensive chronic kidney disease with stage 1 through stage 4 chronic kidney disease, or unspecified chronic kidney disease: Secondary | ICD-10-CM | POA: Diagnosis not present

## 2023-02-07 DIAGNOSIS — N1831 Chronic kidney disease, stage 3a: Secondary | ICD-10-CM | POA: Diagnosis not present

## 2023-02-07 DIAGNOSIS — R7309 Other abnormal glucose: Secondary | ICD-10-CM | POA: Diagnosis not present

## 2023-02-09 DIAGNOSIS — L565 Disseminated superficial actinic porokeratosis (DSAP): Secondary | ICD-10-CM | POA: Diagnosis not present

## 2023-02-09 DIAGNOSIS — Z85828 Personal history of other malignant neoplasm of skin: Secondary | ICD-10-CM | POA: Diagnosis not present

## 2023-02-09 DIAGNOSIS — L570 Actinic keratosis: Secondary | ICD-10-CM | POA: Diagnosis not present

## 2023-02-09 DIAGNOSIS — C44712 Basal cell carcinoma of skin of right lower limb, including hip: Secondary | ICD-10-CM | POA: Diagnosis not present

## 2023-02-09 DIAGNOSIS — D485 Neoplasm of uncertain behavior of skin: Secondary | ICD-10-CM | POA: Diagnosis not present

## 2023-02-09 DIAGNOSIS — L821 Other seborrheic keratosis: Secondary | ICD-10-CM | POA: Diagnosis not present

## 2023-02-15 ENCOUNTER — Encounter: Payer: Self-pay | Admitting: Cardiovascular Disease

## 2023-02-15 ENCOUNTER — Ambulatory Visit: Payer: Medicare HMO | Attending: Cardiovascular Disease | Admitting: Cardiovascular Disease

## 2023-02-15 VITALS — BP 128/82 | HR 64 | Ht 68.0 in | Wt 198.0 lb

## 2023-02-15 DIAGNOSIS — Z9861 Coronary angioplasty status: Secondary | ICD-10-CM | POA: Diagnosis not present

## 2023-02-15 DIAGNOSIS — E782 Mixed hyperlipidemia: Secondary | ICD-10-CM | POA: Diagnosis not present

## 2023-02-15 DIAGNOSIS — I2510 Atherosclerotic heart disease of native coronary artery without angina pectoris: Secondary | ICD-10-CM

## 2023-02-15 DIAGNOSIS — I10 Essential (primary) hypertension: Secondary | ICD-10-CM | POA: Diagnosis not present

## 2023-02-15 NOTE — Assessment & Plan Note (Signed)
History of CAD status post RCA and circumflex stenting by Dr. Aleen Campi back in 2002.  He had circumflex stenting by Dr. Ophelia Shoulder.  He also had intervention by Dr. Clifton James.  I cathed him 11/17/2016 revealing patent RCA and circumflex stents with a lesion just beyond the stented segment and circumflex that was FFR positive at 0.7.  I stented this with a 3 mm wide by 12 mm long Synergy drug-eluting stent dilated up to 3.25 mm.  He is been pain-free since.

## 2023-02-15 NOTE — Assessment & Plan Note (Signed)
History of hyperlipidemia on statin therapy with lipid profile performed 01/31/2023 revealing total cholesterol 133, LDL 75 and HDL 32.

## 2023-02-15 NOTE — Progress Notes (Signed)
02/15/2023 Dennis [name]   [birth year]  [id number]  Primary Physician Georgianne Fick, MD Primary Cardiologist: Runell Gess MD Nicholes [place], [name]  HPI:  Dennis [name] is a 77 y.o.  moderately overweight married Caucasian male with no children who is retired from traveling Presenter, broadcasting. He is a patient of Dr. Nicholos Johns   I last saw him in the office 10/12/2021.Marland Kitchen  He is accompanied by his wife Dennis [name] today.  History of hyperlipidemia and CAD status post RCA and circumflex intervention by Dr. Aleen Campi back in 2002. He was admitted to [facility] 09/30/14 with unstable angina. He underwent cardiac catheterization that day by Dr. Tresa Endo revealing a high-grade mid circumflex lesion on a bend and was stented 2 days later by Dr. Sanjuana Kava  with a Promus premiere drug eluting stent postdilated with a 4.5 mm balloon. When I saw him 3 months ago he had experienced several episodes of nitrate responsive chest pain. I ultimately decided to perform outpatient cardiac catheterization. The right radial approach on 11/17/16 revealing a widely patent proximal circumflex stent, widely patent RCA with 70% lesion in the AV groove circumflex beyond the previously stented segment. I performed FFR which was 0.7 suggesting this was physiologically significant and stented that with a 3 mm x 12 mm long synergy drug-eluting stent post dilating up to 3.25 mm. This pain has since    He had developed  pain in his right leg with some swelling.  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  He ultimately had right above to below the knee bypass grafting by Dr. Tawanna Cooler Early 11/30/2018 which he has still slowly healing from.    He went to the emergency room on 04/08/2020 with chest pain after raking leaves and back and then that day which is unusually strenuous activity for him.  His enzymes were negative and his EKG showed no acute changes.  He was seen  by Dr.Nipp, cardiology fellow, who assessed him and thought he was stable for discharge.  He did add amlodipine 2.5 mg a day.     Since I saw him in the office a year ago he has done well from a heart point of view.  He has unfortunately been recently diagnosed with dementia which has been progressive.  He also has urinary frequency suggesting a prostate issue.  He denies chest pain or shortness of breath.  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   Current Meds  Medication Sig   ascorbic acid (VITAMIN C) 1000 MG tablet Take 1,000 mg by mouth daily.   ASPIRIN 81 PO Take 81 mg by mouth daily.   b complex vitamins capsule Take 1 capsule by mouth daily.   Cholecalciferol (VITAMIN D) 50 MCG (2000 UT) tablet Take 2,000 Units by mouth daily.   clopidogrel (PLAVIX) 75 MG tablet Take 1 tablet by mouth once daily   donepezil (ARICEPT) 10 MG tablet Half tablet at bedtime for first month then increase to whole tablet   fenofibrate 160 MG tablet Take 160 mg by mouth daily.   ipratropium (ATROVENT) 0.03 % nasal spray Place 2 sprays into both nostrils 2 (two) times daily.   levothyroxine (SYNTHROID) 112 MCG tablet Take 112 mcg by mouth every morning.   magnesium oxide (MAG-OX) 400 MG tablet Take 400  mg by mouth daily.   memantine (NAMENDA) 10 MG tablet Take 1 tablet (10 mg total) by mouth daily.   Misc Natural Products (NEURIVA PO) Take 1 tablet by mouth daily.   nitroGLYCERIN (NITROSTAT) 0.4 MG SL tablet PLACE 1 TABLET UNDER THE TONGUE EVERY 5 MINUTES AS NEEDED FOR CHEST PAIN   Omeprazole-Sodium Bicarbonate (ZEGERID) 20-1100 MG CAPS capsule Take 1 capsule by mouth daily before breakfast.   pravastatin (PRAVACHOL) 20 MG tablet Take 20 mg by mouth daily.   telmisartan (MICARDIS) 80 MG tablet Take 80 mg by mouth daily.   timolol (TIMOPTIC) 0.5 % ophthalmic  solution Place 1 drop into both eyes daily.   Vibegron (GEMTESA) 75 MG TABS Take 75 mg by mouth daily.     Allergies  Allergen Reactions   Sulfacetamide Sodium     Other reaction(s): Unknown    Social History   Socioeconomic History   Marital status: Married    Spouse name: Not on file   Number of children: 0   Years of education: 16   Highest education level: Bachelor's degree (e.g., BA, AB, BS)  Occupational History   Occupation: Retired  Tobacco Use   Smoking status: Former    Current packs/day: 0.00    Average packs/day: 1 pack/day for 40.0 years (40.0 ttl pk-yrs)    Types: Cigarettes    Start date: 03/06/1961    Quit date: 03/06/2001    Years since quitting: 21.9   Smokeless tobacco: Never  Vaping Use   Vaping status: Never Used  Substance and Sexual Activity   Alcohol use: Not Currently   Drug use: No   Sexual activity: Not on file  Other Topics Concern   Not on file  Social History Narrative   Right handed   Drink caffeine   Lives with wife wendy   1 floor ranch home   retired   Chief Executive Officer Determinants of Corporate investment banker Strain: Not on file  Food Insecurity: Not on file  Transportation Needs: Not on file  Physical Activity: Not on file  Stress: Not on file  Social Connections: Not on file  Intimate Partner Violence: Not on file     Review of Systems: General: negative for chills, fever, night sweats or weight changes.  Cardiovascular: negative for chest pain, dyspnea on exertion, edema, orthopnea, palpitations, paroxysmal nocturnal dyspnea or shortness of breath Dermatological: negative for rash Respiratory: negative for cough or wheezing Urologic: negative for hematuria Abdominal: negative for nausea, vomiting, diarrhea, bright red blood per rectum, melena, or hematemesis Neurologic: negative for visual changes, syncope, or dizziness All other systems reviewed and are otherwise negative except as noted above.    Blood pressure (!)  146/90, pulse 64, height 5\' 8"  (1.727 m), weight 198 lb (89.8 kg), SpO2 94%.  General appearance: alert and no distress Neck: no adenopathy, no carotid bruit, no JVD, supple, symmetrical, trachea midline, and thyroid not enlarged, symmetric, no tenderness/mass/nodules Lungs: clear to auscultation bilaterally Heart: Regular rate and rhythm without murmurs gallops rubs or clicks Extremities: extremities normal, atraumatic, no cyanosis or edema Pulses: 2+ and symmetric Skin: Skin color, texture, turgor normal. No rashes or lesions Neurologic: Grossly normal  EKG not performed today      ASSESSMENT AND PLAN:   CAD S/P multiple PCIs History of CAD status post RCA and circumflex stenting by Dr. Aleen Campi back in 2002.  He had circumflex stenting by Dr. Ophelia Shoulder.  He also had intervention by Dr. Clifton James.  I cathed him 11/17/2016  revealing patent RCA and circumflex stents with a lesion just beyond the stented segment and circumflex that was FFR positive at 0.7.  I stented this with a 3 mm wide by 12 mm long Synergy drug-eluting stent dilated up to 3.25 mm.  He is been pain-free since.  Essential hypertension History of essential hypertension blood pressure measured today at 146/90.  His wife says his blood pressure runs in the 130/80 range at home.  He was on amlodipine which was discontinued by Anice Paganini but remains on carvedilol and Micardis.  Hyperlipidemia History of hyperlipidemia on statin therapy with lipid profile performed 01/31/2023 revealing total cholesterol 133, LDL 75 and HDL 32.     Runell Gess MD [facility], [facility] 02/15/2023 4:06 PM

## 2023-02-15 NOTE — Patient Instructions (Signed)
Medication Instructions:  Your physician recommends that you continue on your current medications as directed. Please refer to the Current Medication list given to you today.  *If you need a refill on your cardiac medications before your next appointment, please call your pharmacy*   Follow-Up: At [facility], you and your health needs are our priority.  As part of our continuing mission to provide you with exceptional heart care, we have created designated Provider Care Teams.  These Care Teams include your primary Cardiologist (physician) and Advanced Practice Providers (APPs -  Physician Assistants and Nurse Practitioners) who all work together to provide you with the care you need, when you need it.  We recommend signing up for the patient portal called "MyChart".  Sign up information is provided on this After Visit Summary.  MyChart is used to connect with patients for Virtual Visits (Telemedicine).  Patients are able to view lab/test results, encounter notes, upcoming appointments, etc.  Non-urgent messages can be sent to your provider as well.   To learn more about what you can do with MyChart, go to [email or web address].    Your next appointment:   6 month(s)  Provider:   Diona Browner, NP      Then, [name], MD will plan to see you again in 12 month(s).

## 2023-02-15 NOTE — Assessment & Plan Note (Signed)
History of essential hypertension blood pressure measured today at 146/90.  His wife says his blood pressure runs in the 130/80 range at home.  He was on amlodipine which was discontinued by Anice Paganini but remains on carvedilol and Micardis.

## 2023-02-17 DIAGNOSIS — K449 Diaphragmatic hernia without obstruction or gangrene: Secondary | ICD-10-CM | POA: Diagnosis not present

## 2023-02-17 DIAGNOSIS — R310 Gross hematuria: Secondary | ICD-10-CM | POA: Diagnosis not present

## 2023-02-17 DIAGNOSIS — K4020 Bilateral inguinal hernia, without obstruction or gangrene, not specified as recurrent: Secondary | ICD-10-CM | POA: Diagnosis not present

## 2023-02-21 DIAGNOSIS — C44712 Basal cell carcinoma of skin of right lower limb, including hip: Secondary | ICD-10-CM | POA: Diagnosis not present

## 2023-03-15 ENCOUNTER — Telehealth: Payer: Self-pay | Admitting: Physician Assistant

## 2023-03-15 NOTE — Telephone Encounter (Signed)
Patient is seeing things that are not there, he is walking in circles and pacing, he does not remember any of this, he is saying things that are not true. He sees Huntley Dec on 04-19-23  please call patient wife

## 2023-03-16 NOTE — Telephone Encounter (Signed)
Patient is doing better this am. Thanked me for calling. Hallucinations are gone.

## 2023-03-21 ENCOUNTER — Ambulatory Visit: Payer: Medicare HMO | Admitting: Physician Assistant

## 2023-03-21 ENCOUNTER — Encounter: Payer: Self-pay | Admitting: Physician Assistant

## 2023-03-21 VITALS — BP 156/87 | HR 64 | Resp 20 | Ht 68.0 in | Wt 194.0 lb

## 2023-03-21 DIAGNOSIS — F0280 Dementia in other diseases classified elsewhere without behavioral disturbance: Secondary | ICD-10-CM

## 2023-03-21 DIAGNOSIS — F0150 Vascular dementia without behavioral disturbance: Secondary | ICD-10-CM | POA: Diagnosis not present

## 2023-03-21 DIAGNOSIS — G309 Alzheimer's disease, unspecified: Secondary | ICD-10-CM

## 2023-03-21 NOTE — Patient Instructions (Addendum)
It was a pleasure to see you today at our office.     Keep taking memantine and donepezil as prescribed Follow up on April 22, 11:30 am  Consider mood medication for hallucinations such as Depakote 125 mg at night, can certainly be managed by primary      For assessment of decision of mental capacity and competency:  Call Dr. Erick Blinks, geriatric psychiatrist at [phone] Counseling regarding caregiver distress, including caregiver depression, anxiety and issues regarding community resources, adult day care programs, adult living facilities, or memory care questions:  please contact your  Primary Doctor's Social Worker  Whom to call: Memory  decline, memory medications: Call our office [phone]  For psychiatric meds, mood meds: Please have your primary care physician manage these medications.  If you have any severe symptoms of a stroke, or other severe issues such as confusion,severe chills or fever, etc call 911 or go to the ER as you may need to be evaluated further    RECOMMENDATIONS FOR ALL PATIENTS WITH MEMORY PROBLEMS: 1. Continue to exercise (Recommend 30 minutes of walking everyday, or 3 hours every week) 2. Increase social interactions - continue going to [place] and enjoy social gatherings with friends and family 3. Eat healthy, avoid fried foods and eat more fruits and vegetables 4. Maintain adequate blood pressure, blood sugar, and blood cholesterol level. Reducing the risk of stroke and cardiovascular disease also helps promoting better memory. 5. Avoid stressful situations. Live a simple life and avoid aggravations. Organize your time and prepare for the next day in anticipation. 6. Sleep well, avoid any interruptions of sleep and avoid any distractions in the bedroom that may interfere with adequate sleep quality 7. Avoid sugar, avoid sweets as there is a strong link between excessive sugar intake, diabetes, and cognitive impairment We discussed the Mediterranean  diet, which has been shown to help patients reduce the risk of progressive memory disorders and reduces cardiovascular risk. This includes eating fish, eat fruits and green leafy vegetables, nuts like almonds and hazelnuts, walnuts, and also use olive oil. Avoid fast foods and fried foods as much as possible. Avoid sweets and sugar as sugar use has been linked to worsening of memory function.  There is always a concern of gradual progression of memory problems. If this is the case, then we may need to adjust level of care according to patient needs. Support, both to the patient and caregiver, should then be put into place.       FALL PRECAUTIONS: Be cautious when walking. Scan the area for obstacles that may increase the risk of trips and falls. When getting up in the mornings, sit up at the edge of the bed for a few minutes before getting out of bed. Consider elevating the bed at the head end to avoid drop of blood pressure when getting up. Walk always in a well-lit room (use night lights in the walls). Avoid area rugs or power cords from appliances in the middle of the walkways. Use a walker or a cane if necessary and consider physical therapy for balance exercise. Get your eyesight checked regularly.  FINANCIAL OVERSIGHT: Supervision, especially oversight when making financial decisions or transactions is also recommended.  HOME SAFETY: Consider the safety of the kitchen when operating appliances like stoves, microwave oven, and blender. Consider having supervision and share cooking responsibilities until no longer able to participate in those. Accidents with firearms and other hazards in the house should be identified and addressed as well.  ABILITY TO BE LEFT ALONE: If patient is unable to contact 911 operator, consider using LifeLine, or when the need is there, arrange for someone to stay with patients. Smoking is a fire hazard, consider supervision or cessation. Risk of wandering should be  assessed by caregiver and if detected at any point, supervision and safe proof recommendations should be instituted.  MEDICATION SUPERVISION: Inability to self-administer medication needs to be constantly addressed. Implement a mechanism to ensure safe administration of the medications.   DRIVING: Regarding driving, in patients with progressive memory problems, driving will be impaired. We advise to have someone else do the driving if trouble finding directions or if minor accidents are reported. Independent driving assessment is available to determine safety of driving.   If you are interested in the driving assessment, you can contact the following:  [facility] in [place] [phone]  [facility] [phone]  [facility] [phone] or [phone]    Mediterranean Diet A Mediterranean diet refers to food and lifestyle choices that are based on the traditions of countries located on the [employer]. This way of eating has been shown to help prevent certain conditions and improve outcomes for people who have chronic diseases, like kidney disease and heart disease. What are tips for following this plan? Lifestyle  Cook and eat meals together with your family, when possible. Drink enough fluid to keep your urine clear or pale yellow. Be physically active every day. This includes: Aerobic exercise like running or swimming. Leisure activities like gardening, walking, or housework. Get 7-8 hours of sleep each night. If recommended by your health care provider, drink red wine in moderation. This means 1 glass a day for nonpregnant women and 2 glasses a day for men. A glass of wine equals 5 oz (150 mL). Reading food labels  Check the serving size of packaged foods. For foods such as rice and pasta, the serving size refers to the amount of cooked product, not dry. Check the total fat in packaged foods.  Avoid foods that have saturated fat or trans fats. Check the ingredients list for added sugars, such as corn syrup. Shopping  At the grocery store, buy most of your food from the areas near the walls of the store. This includes: Fresh fruits and vegetables (produce). Grains, beans, nuts, and seeds. Some of these may be available in unpackaged forms or large amounts (in bulk). Fresh seafood. Poultry and eggs. Low-fat dairy products. Buy whole ingredients instead of prepackaged foods. Buy fresh fruits and vegetables in-season from local farmers markets. Buy frozen fruits and vegetables in resealable bags. If you do not have access to quality fresh seafood, buy precooked frozen shrimp or canned fish, such as tuna, salmon, or sardines. Buy small amounts of raw or cooked vegetables, salads, or olives from the deli or salad bar at your store. Stock your pantry so you always have certain foods on hand, such as olive oil, canned tuna, canned tomatoes, rice, pasta, and beans. Cooking  Cook foods with extra-virgin olive oil instead of using butter or other vegetable oils. Have meat as a side dish, and have vegetables or grains as your main dish. This means having meat in small portions or adding small amounts of meat to foods like pasta or stew. Use beans or vegetables instead of meat in common dishes like chili or lasagna. Experiment with different cooking methods. Try roasting or broiling vegetables instead of steaming or sauteing them. Add frozen vegetables  to soups, stews, pasta, or rice. Add nuts or seeds for added healthy fat at each meal. You can add these to yogurt, salads, or vegetable dishes. Marinate fish or vegetables using olive oil, lemon juice, garlic, and fresh herbs. Meal planning  Plan to eat 1 vegetarian meal one day each week. Try to work up to 2 vegetarian meals, if possible. Eat seafood 2 or more times a week. Have healthy snacks readily available, such as: Vegetable sticks  with hummus. Greek yogurt. Fruit and nut trail mix. Eat balanced meals throughout the week. This includes: Fruit: 2-3 servings a day Vegetables: 4-5 servings a day Low-fat dairy: 2 servings a day Fish, poultry, or lean meat: 1 serving a day Beans and legumes: 2 or more servings a week Nuts and seeds: 1-2 servings a day Whole grains: 6-8 servings a day Extra-virgin olive oil: 3-4 servings a day Limit red meat and sweets to only a few servings a month What are my food choices? Mediterranean diet Recommended Grains: Whole-grain pasta. Brown rice. Bulgar wheat. Polenta. Couscous. Whole-wheat bread. Orpah Cobb. Vegetables: Artichokes. Beets. Broccoli. Cabbage. Carrots. Eggplant. Green beans. Chard. Kale. Spinach. Onions. Leeks. Peas. Squash. Tomatoes. Peppers. Radishes. Fruits: Apples. Apricots. Avocado. Berries. Bananas. Cherries. Dates. Figs. Grapes. Lemons. Melon. Oranges. Peaches. Plums. Pomegranate. Meats and other protein foods: Beans. Almonds. Sunflower seeds. Pine nuts. Peanuts. Cod. Salmon. Scallops. Shrimp. Tuna. Tilapia. Clams. Oysters. Eggs. Dairy: Low-fat milk. Cheese. Greek yogurt. Beverages: Water. Red wine. Herbal tea. Fats and oils: Extra virgin olive oil. Avocado oil. Grape seed oil. Sweets and desserts: [place] yogurt with honey. Baked apples. Poached pears. Trail mix. Seasoning and other foods: Basil. Cilantro. Coriander. Cumin. Mint. Parsley. Sage. Rosemary. Tarragon. Garlic. Oregano. Thyme. Pepper. Balsalmic vinegar. Tahini. Hummus. Tomato sauce. Olives. Mushrooms. Limit these Grains: Prepackaged pasta or rice dishes. Prepackaged cereal with added sugar. Vegetables: Deep fried potatoes (french fries). Fruits: Fruit canned in syrup. Meats and other protein foods: Beef. Pork. Lamb. Poultry with skin. Hot dogs. Tomasa Blase. Dairy: Ice cream. Sour cream. Whole milk. Beverages: Juice. Sugar-sweetened soft drinks. Beer. Liquor and spirits. Fats and oils: Butter. Canola oil.  Vegetable oil. Beef fat (tallow). Lard. Sweets and desserts: Cookies. Cakes. Pies. Candy. Seasoning and other foods: Mayonnaise. Premade sauces and marinades. The items listed may not be a complete list. Talk with your dietitian about what dietary choices are right for you. Summary The Mediterranean diet includes both food and lifestyle choices. Eat a variety of fresh fruits and vegetables, beans, nuts, seeds, and whole grains. Limit the amount of red meat and sweets that you eat. Talk with your health care provider about whether it is safe for you to drink red wine in moderation. This means 1 glass a day for nonpregnant women and 2 glasses a day for men. A glass of wine equals 5 oz (150 mL). This information is not intended to replace advice given to you by your health care provider. Make sure you discuss any questions you have with your health care provider. Document Released: 01/14/2016 Document Revised: 02/16/2016 Document Reviewed: 01/14/2016 Elsevier Interactive Patient Education  2017 [employer].   [address] [facility] at [facility] [phone]
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
## 2023-03-29 DIAGNOSIS — R441 Visual hallucinations: Secondary | ICD-10-CM | POA: Diagnosis not present

## 2023-03-29 DIAGNOSIS — I2510 Atherosclerotic heart disease of native coronary artery without angina pectoris: Secondary | ICD-10-CM | POA: Diagnosis not present

## 2023-03-29 DIAGNOSIS — F0280 Dementia in other diseases classified elsewhere without behavioral disturbance: Secondary | ICD-10-CM | POA: Diagnosis not present

## 2023-03-29 DIAGNOSIS — N1831 Chronic kidney disease, stage 3a: Secondary | ICD-10-CM | POA: Diagnosis not present

## 2023-03-29 DIAGNOSIS — G301 Alzheimer's disease with late onset: Secondary | ICD-10-CM | POA: Diagnosis not present

## 2023-03-29 DIAGNOSIS — I129 Hypertensive chronic kidney disease with stage 1 through stage 4 chronic kidney disease, or unspecified chronic kidney disease: Secondary | ICD-10-CM | POA: Diagnosis not present

## 2023-03-29 DIAGNOSIS — E782 Mixed hyperlipidemia: Secondary | ICD-10-CM | POA: Diagnosis not present

## 2023-03-29 DIAGNOSIS — R7309 Other abnormal glucose: Secondary | ICD-10-CM | POA: Diagnosis not present

## 2023-03-29 DIAGNOSIS — E039 Hypothyroidism, unspecified: Secondary | ICD-10-CM | POA: Diagnosis not present

## 2023-03-29 DIAGNOSIS — R600 Localized edema: Secondary | ICD-10-CM | POA: Diagnosis not present

## 2023-04-06 ENCOUNTER — Institutional Professional Consult (permissible substitution): Payer: Medicare HMO | Admitting: Psychology

## 2023-04-06 ENCOUNTER — Ambulatory Visit: Payer: Self-pay

## 2023-04-13 ENCOUNTER — Encounter: Payer: Medicare HMO | Admitting: Psychology

## 2023-04-19 ENCOUNTER — Ambulatory Visit: Payer: Medicare HMO | Admitting: Physician Assistant

## 2023-04-19 ENCOUNTER — Other Ambulatory Visit: Payer: Self-pay | Admitting: Cardiovascular Disease

## 2023-04-21 ENCOUNTER — Encounter: Payer: Self-pay | Admitting: Cardiovascular Disease

## 2023-04-21 NOTE — Telephone Encounter (Signed)
Error

## 2023-05-11 DIAGNOSIS — R600 Localized edema: Secondary | ICD-10-CM | POA: Diagnosis not present

## 2023-05-11 DIAGNOSIS — R441 Visual hallucinations: Secondary | ICD-10-CM | POA: Diagnosis not present

## 2023-05-11 DIAGNOSIS — E782 Mixed hyperlipidemia: Secondary | ICD-10-CM | POA: Diagnosis not present

## 2023-05-11 DIAGNOSIS — E039 Hypothyroidism, unspecified: Secondary | ICD-10-CM | POA: Diagnosis not present

## 2023-05-11 DIAGNOSIS — N1831 Chronic kidney disease, stage 3a: Secondary | ICD-10-CM | POA: Diagnosis not present

## 2023-05-11 DIAGNOSIS — I2510 Atherosclerotic heart disease of native coronary artery without angina pectoris: Secondary | ICD-10-CM | POA: Diagnosis not present

## 2023-05-11 DIAGNOSIS — R7309 Other abnormal glucose: Secondary | ICD-10-CM | POA: Diagnosis not present

## 2023-05-11 DIAGNOSIS — I129 Hypertensive chronic kidney disease with stage 1 through stage 4 chronic kidney disease, or unspecified chronic kidney disease: Secondary | ICD-10-CM | POA: Diagnosis not present

## 2023-05-18 DIAGNOSIS — Z0000 Encounter for general adult medical examination without abnormal findings: Secondary | ICD-10-CM | POA: Diagnosis not present

## 2023-05-18 DIAGNOSIS — N1831 Chronic kidney disease, stage 3a: Secondary | ICD-10-CM | POA: Diagnosis not present

## 2023-05-18 DIAGNOSIS — I129 Hypertensive chronic kidney disease with stage 1 through stage 4 chronic kidney disease, or unspecified chronic kidney disease: Secondary | ICD-10-CM | POA: Diagnosis not present

## 2023-05-18 DIAGNOSIS — E039 Hypothyroidism, unspecified: Secondary | ICD-10-CM | POA: Diagnosis not present

## 2023-05-18 DIAGNOSIS — R634 Abnormal weight loss: Secondary | ICD-10-CM | POA: Diagnosis not present

## 2023-05-18 DIAGNOSIS — F0280 Dementia in other diseases classified elsewhere without behavioral disturbance: Secondary | ICD-10-CM | POA: Diagnosis not present

## 2023-05-18 DIAGNOSIS — R600 Localized edema: Secondary | ICD-10-CM | POA: Diagnosis not present

## 2023-05-18 DIAGNOSIS — I2510 Atherosclerotic heart disease of native coronary artery without angina pectoris: Secondary | ICD-10-CM | POA: Diagnosis not present

## 2023-05-18 DIAGNOSIS — E782 Mixed hyperlipidemia: Secondary | ICD-10-CM | POA: Diagnosis not present

## 2023-05-18 DIAGNOSIS — K529 Noninfective gastroenteritis and colitis, unspecified: Secondary | ICD-10-CM | POA: Diagnosis not present

## 2023-05-18 DIAGNOSIS — G301 Alzheimer's disease with late onset: Secondary | ICD-10-CM | POA: Diagnosis not present

## 2023-05-22 ENCOUNTER — Other Ambulatory Visit: Payer: Self-pay | Admitting: Cardiovascular Disease

## 2023-05-26 ENCOUNTER — Telehealth: Payer: Self-pay | Admitting: Physician Assistant

## 2023-05-26 DIAGNOSIS — R3915 Urgency of urination: Secondary | ICD-10-CM | POA: Diagnosis not present

## 2023-05-26 NOTE — Telephone Encounter (Signed)
Pt has become very agitated, paces at night, combative, and doesn't want to take his medication. She says the depakote right now and it doesn't seem to be working. She is wanting to see if Rexulti might work for him? She has spoken with his PCP, but Dr. Nicholos Johns wants a second opinion on the Rexulti. He is concerned about stopping the depakote and switching to Rexulti.

## 2023-05-26 NOTE — Telephone Encounter (Signed)
I advised to contact PCP, she will reach out to PCP. Thanked me for calling.

## 2023-06-12 DIAGNOSIS — R197 Diarrhea, unspecified: Secondary | ICD-10-CM | POA: Diagnosis not present

## 2023-06-12 DIAGNOSIS — K5900 Constipation, unspecified: Secondary | ICD-10-CM | POA: Diagnosis not present

## 2023-06-13 ENCOUNTER — Other Ambulatory Visit: Payer: Self-pay | Admitting: Nurse Practitioner

## 2023-06-13 DIAGNOSIS — R197 Diarrhea, unspecified: Secondary | ICD-10-CM | POA: Diagnosis not present

## 2023-06-13 DIAGNOSIS — R634 Abnormal weight loss: Secondary | ICD-10-CM

## 2023-07-04 ENCOUNTER — Ambulatory Visit
Admission: RE | Admit: 2023-07-04 | Discharge: 2023-07-04 | Disposition: A | Discharge status: Home / Self Care | Payer: Medicare Other | Source: Ambulatory Visit | Attending: Nurse Practitioner | Admitting: Nurse Practitioner

## 2023-07-04 ENCOUNTER — Other Ambulatory Visit: Payer: Self-pay | Admitting: Nurse Practitioner

## 2023-07-04 DIAGNOSIS — K5900 Constipation, unspecified: Secondary | ICD-10-CM | POA: Diagnosis not present

## 2023-07-04 DIAGNOSIS — R634 Abnormal weight loss: Secondary | ICD-10-CM | POA: Diagnosis not present

## 2023-07-04 DIAGNOSIS — K5909 Other constipation: Secondary | ICD-10-CM

## 2023-07-04 DIAGNOSIS — R197 Diarrhea, unspecified: Secondary | ICD-10-CM | POA: Diagnosis not present

## 2023-07-04 DIAGNOSIS — K838 Other specified diseases of biliary tract: Secondary | ICD-10-CM | POA: Diagnosis not present

## 2023-07-04 DIAGNOSIS — N281 Cyst of kidney, acquired: Secondary | ICD-10-CM | POA: Diagnosis not present

## 2023-07-04 MED ORDER — IOPAMIDOL (ISOVUE-300) INJECTION 61%
500.0000 mL | Freq: Once | INTRAVENOUS | Status: AC | PRN
  Administered 2023-07-04: 80 mL via INTRAVENOUS

## 2023-07-18 DIAGNOSIS — R899 Unspecified abnormal finding in specimens from other organs, systems and tissues: Secondary | ICD-10-CM | POA: Diagnosis not present

## 2023-07-31 DIAGNOSIS — N281 Cyst of kidney, acquired: Secondary | ICD-10-CM | POA: Diagnosis not present

## 2023-08-14 ENCOUNTER — Ambulatory Visit: Payer: Medicare HMO | Admitting: Nurse Practitioner

## 2023-08-23 ENCOUNTER — Ambulatory Visit: Payer: Self-pay | Attending: Nurse Practitioner | Admitting: Nurse Practitioner

## 2023-08-23 ENCOUNTER — Encounter: Payer: Self-pay | Admitting: Nurse Practitioner

## 2023-08-23 VITALS — BP 104/60 | HR 64 | Ht 67.0 in | Wt 186.0 lb

## 2023-08-23 DIAGNOSIS — I10 Essential (primary) hypertension: Secondary | ICD-10-CM | POA: Diagnosis not present

## 2023-08-23 DIAGNOSIS — E785 Hyperlipidemia, unspecified: Secondary | ICD-10-CM

## 2023-08-23 DIAGNOSIS — I2510 Atherosclerotic heart disease of native coronary artery without angina pectoris: Secondary | ICD-10-CM

## 2023-08-23 DIAGNOSIS — E038 Other specified hypothyroidism: Secondary | ICD-10-CM

## 2023-08-23 DIAGNOSIS — Z8673 Personal history of transient ischemic attack (TIA), and cerebral infarction without residual deficits: Secondary | ICD-10-CM | POA: Diagnosis not present

## 2023-08-23 DIAGNOSIS — I724 Aneurysm of artery of lower extremity: Secondary | ICD-10-CM

## 2023-08-23 DIAGNOSIS — F03918 Unspecified dementia, unspecified severity, with other behavioral disturbance: Secondary | ICD-10-CM

## 2023-08-23 NOTE — Patient Instructions (Addendum)
 Medication Instructions:  Your physician recommends that you continue on your current medications as directed. Please refer to the Current Medication list given to you today.  *If you need a refill on your cardiac medications before your next appointment, please call your pharmacy*   Lab Work: NONE ordered at this time of appointment   Testing/Procedures: NONE ordered at this time of appointment   Follow-Up: At [facility], you and your health needs are our priority.  As part of our continuing mission to provide you with exceptional heart care, we have created designated Provider Care Teams.  These Care Teams include your primary Cardiologist (physician) and Advanced Practice Providers (APPs -  Physician Assistants and Nurse Practitioners) who all work together to provide you with the care you need, when you need it.  We recommend signing up for the patient portal called "MyChart".  Sign up information is provided on this After Visit Summary.  MyChart is used to connect with patients for Virtual Visits (Telemedicine).  Patients are able to view lab/test results, encounter notes, upcoming appointments, etc.  Non-urgent messages can be sent to your provider as well.   To learn more about what you can do with MyChart, go to [email or web address].    Your next appointment:   6 month(s)  Provider:   Nanetta Batty, MD  or Bernadene Person, NP        Other Instructions   1st Floor: - Lobby - Registration  - Pharmacy  - Lab - Cafe  2nd Floor: - PV Lab - Diagnostic Testing (echo, CT, nuclear med)  3rd Floor: - Vacant  4th Floor: - TCTS (cardiothoracic surgery) - AFib Clinic - Structural Heart Clinic - Vascular Surgery  - Vascular Ultrasound  5th Floor: - [facility] (general and EP) - Clinical Pharmacy for coumadin, hypertension, lipid, weight-loss medications, and med management appointments    Valet parking services will be available as well.

## 2023-08-23 NOTE — Progress Notes (Signed)
 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   Past Medical History    Past Medical History:  Diagnosis Date   CAD S/P multiple PCIs    MI- 2002 Stent RCA   April 2016- DES to CFX  June 2018- new mCFX DES      Chronic kidney disease, stage 3a 11/27/2020   CVA (cerebral vascular accident)    12/2022 MRI - subacute infarct in the posterior left frontal lobe, as well as remote infarcts in the right postcentral gyrus, bilateral cerebellar hemispheres, left pons, left thalamus, and bilateral centrum semiovale and corona radiata    ED (erectile dysfunction) of organic origin 11/27/2020   Elevated PSA 11/27/2020   Enlarged prostate 12/12/2022   consulted about suspending Clopidogrel for 6 weeks to treat Prostate inflammation w/ Meloxicam 15 MG once daily for 6 weeks   Essential hypertension 10/03/2014   Gastroesophageal reflux disease 11/27/2020   Hyperlipidemia    Hypomagnesemia 11/27/2020   Hypothyroidism    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   Myocardial infarction 03/2001   Pedal edema 11/27/2020   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   Prediabetes 11/27/2020   RBBB (right bundle branch block) 10/03/2014   Squamous cell carcinoma of scalp    "some burned, some cut off" (11/17/2016)   Thyroid nodule, hot    s/p radioactive iodine, now hypothyroidism   Unstable angina 09/30/2014   Vitamin D deficiency 11/27/2020   Past Surgical History:  Procedure Laterality Date   BYPASS GRAFT POPLITEAL TO  POPLITEAL Right 11/30/2018   Procedure: RIGHT POPLITEAL ARTERY ANEURYSM REPAIR WITH ABOVE KNEE POPLITEAL TO BELOW KNEE POPLITEAL BY PASS GRAFT USING SAPPEHNOUS VEIN;  Surgeon: Larina Earthly, MD;  Location: [facility] OR;  Service: Vascular;  Laterality: Right;   CARDIAC CATHETERIZATION  09/2014   "day before they put the stent in"   CORONARY ANGIOPLASTY WITH STENT PLACEMENT  03/2001; 09/2014; 11/17/2016   "1+1+1"   CORONARY PRESSURE/FFR STUDY N/A 11/17/2016   Procedure: Intravascular Pressure Wire/FFR Study;  Surgeon: Runell Gess, MD;  Location: [facility] INVASIVE CV LAB;  Service: Cardiovascular;  Laterality: N/A;   CORONARY STENT INTERVENTION N/A 11/17/2016   Procedure: Coronary Stent Intervention;  Surgeon: Runell Gess, MD;  Location: [facility] INVASIVE CV LAB;  Service: Cardiovascular;  Laterality: N/A;   LEFT HEART CATH AND CORONARY ANGIOGRAPHY N/A 11/17/2016   Procedure: Left Heart Cath and Coronary Angiography;  Surgeon: Runell Gess, MD;  Location: [facility] INVASIVE CV LAB;  Service: Cardiovascular;  Laterality: N/A;   LEFT HEART CATHETERIZATION WITH CORONARY ANGIOGRAM N/A 09/30/2014   Procedure: LEFT HEART CATHETERIZATION WITH CORONARY ANGIOGRAM;  Surgeon: Lennette Bihari, MD;  Location: [facility] CATH LAB;  Service: Cardiovascular;  Laterality: N/A;   PERCUTANEOUS CORONARY STENT INTERVENTION (PCI-S) N/A 10/02/2014   Procedure: PERCUTANEOUS CORONARY STENT INTERVENTION (PCI-S);  Surgeon: Kathleene Hazel, MD;  Location: [facility] CATH LAB;  Service: Cardiovascular;  Laterality: N/A;   SQUAMOUS CELL CARCINOMA EXCISION     "scalp"   WISDOM TOOTH EXTRACTION  Allergies  Allergies  Allergen Reactions   Sulfacetamide Sodium     Other reaction(s): Unknown     Labs/Other Studies Reviewed    The following studies were reviewed today:  Cardiac Studies & Procedures   ______________________________________________________________________________________________ CARDIAC CATHETERIZATION  CARDIAC  CATHETERIZATION 11/17/2016  Narrative Images from the original result were not included.   Ost RCA to Prox RCA lesion, 40 %stenosed.  Prox Cx to Mid Cx lesion, 0 %stenosed.  Mid Cx lesion, 70 %stenosed.  Post intervention, there is a 0% residual stenosis.  A stent was successfully placed.  The left ventricular systolic function is normal.  LV end diastolic pressure is normal.  The left ventricular ejection fraction is 55-65% by visual estimate.  Dennis [name] is a 78 y.o. male   [id number] LOCATION:  FACILITY: [facility] PHYSICIAN: Nanetta Batty, M.D. 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 He is a patient of Dr. Marylou Flesher. I last saw him in the office 04/13/16. History of hyperlipidemia and CAD status post RCA and circumflex intervention by Dr. Aleen Campi back in 2002. He was admitted to [facility] 09/30/14 with unstable angina. He underwent cardiac catheterization that day by Dr. Tresa Endo revealing a high-grade mid circumflex lesion on a bend and was stented 2 days later by Dr. Sanjuana Kava  with a Promus premiere drug eluting stent postdilated with a 4.5 mm balloon. Since I saw him last he has been essentially asymptomatic up until recently when he had an episode of nitrate responsive chest pain on Mother's Day and a subsequent episode as well. Because the symptoms were similar to his pre-stent symptoms it was elected to proceed with outpatient cardiac catheterization   PROCEDURE DESCRIPTION:  The patient was brought to the second floor [place] Cardiac cath lab in the postabsorptive state. He was premedicated with Valium 5 mg by mouth, IV Versed and fentanyl. His right wrist was prepped and shaved in usual sterile  fashion. Xylocaine 1% was used for local anesthesia. A 6 French sheath was inserted into the right radial artery using standard Seldinger technique. The patient received 5000 units  of heparin  intravenously.  A 5 [place] TIG catheter, left Judkins catheter and pigtail catheters were used for selective coronary angiography and left ventriculography respectively. Isovue dye was used for the entirety of the case. Retrograde aortic, left ventricular and pullback pressures were recorded. Radial cocktail was administered via the SideArm sheath.  The patient received Angiomax bolus followed by infusion with a therapeutic ACT demonstrated. He was already on aspirin and Plavix. The previously placed mid AV groove circumflex stent was widely patent. There was an intermediate lesion just beyond this after a bend. Because of this I elected to perform FFR using the ACIST device after infusing intravenous adenosine. The FFR was 0.7 suggesting the lesion was physiologically significant. Using a 6 [place] XB 3.5 cm guide catheter along with an 014 Pro water guide wire and a 2 mm x 12 mm balloon predilatation was performed. Following this a 3 mm x 12 mm Synergy drug-eluting stent was then carefully positioned angiographically and deployed at 16 atm. It was postdilated with a 3.25 mm x 12 mm noncompliant balloon up to 16 atm (3.3 mm) with diffuse 70% stenosis to 0% residual. The patient  tolerated the procedure well. The guidewire and catheter were removed. The sheath was removed and a TR band was placed on the right wrist which is patent hemostasis. Angiomax was turned off. The patient left the lab in stable condition.  Impression Successful FFR guided mid AV groove circumflex PCI and drug-eluting stent of a 70% lesion just beyond the previously stented segment with an excellent angiographic result. Patient will be discharged the morning underwent topical therapy.  Nanetta Batty. MD, [facility] 11/17/2016 3:01  PM  Findings Coronary Findings Diagnostic  Dominance: Right  Left Circumflex Prox Cx to Mid Cx lesion with no stenosis was previously treated.  Right Coronary Artery  Intervention  Mid Cx lesion Angioplasty A stent was successfully placed. There is a 0% residual stenosis post intervention.     ECHOCARDIOGRAM  ECHOCARDIOGRAM COMPLETE 12/12/2022  Narrative ECHOCARDIOGRAM REPORT    Patient Name:   Helaman Mecca Date of Exam: 12/12/2022 Medical Rec #:  [id number]                 Height:       68.0 in Accession #:    [id number]                Weight:       210.2 lb Date of Birth:  1945/07/03                BSA:          2.087 m Patient Age:    76 years                  BP:           134/68 mmHg Patient Gender: M                         HR:           61 bpm. Exam Location:  Outpatient  Procedure: 2D Echo, 3D Echo, Color Doppler, Cardiac Doppler and Strain Analysis  Indications:    Cerebrovascular accident (CVA), unspecified mechanism (HCC) [I63.9 (ICD-10-CM)]  History:        Patient has no prior history of Echocardiogram examinations. Previous Myocardial Infarction and CAD, Prior CABG, Stroke, PAD and CKD, Dementia, Arrythmias:RBBB; Risk Factors:Hypertension, Dyslipidemia and Former Smoker.  Sonographer:    Jake Seats RDMS, RVT, RDCS Referring Phys: Marcos Eke  IMPRESSIONS   1. Left ventricular ejection fraction, by estimation, is 50 to 55%. Left ventricular ejection fraction by 3D volume is 52 %. The left ventricle has low normal function. The left ventricle has no regional wall motion abnormalities. Left ventricular diastolic parameters are consistent with Grade I diastolic dysfunction (impaired relaxation). 2. Right ventricular systolic function is normal. The right ventricular size is normal. Tricuspid regurgitation signal is inadequate for assessing PA pressure. 3. The mitral valve is normal in structure. Mild mitral valve regurgitation. No  evidence of mitral stenosis. 4. The aortic valve is tricuspid. Aortic valve regurgitation is trivial. No aortic stenosis is present. 5. The inferior vena cava is normal in size with greater than 50% respiratory variability, suggesting right atrial pressure of 3 mmHg.  FINDINGS Left Ventricle: Left ventricular ejection fraction, by estimation, is 50 to 55%. Left ventricular ejection fraction by 3D volume is 52 %. The left ventricle has low normal function. The left ventricle has no regional wall motion abnormalities. Global longitudinal strain performed but not reported based on interpreter judgement due to suboptimal tracking. The left ventricular  internal cavity size was normal in size. There is borderline concentric left ventricular hypertrophy. Left ventricular diastolic parameters are consistent with Grade I diastolic dysfunction (impaired relaxation). Normal left ventricular filling pressure.  Right Ventricle: The right ventricular size is normal. No increase in right ventricular wall thickness. Right ventricular systolic function is normal. Tricuspid regurgitation signal is inadequate for assessing PA pressure. The tricuspid regurgitant velocity is 1.43 m/s, and with an assumed right atrial pressure of 3 mmHg, the estimated right ventricular systolic pressure is 11.2 mmHg.  Left Atrium: Left atrial size was normal in size.  Right Atrium: Right atrial size was normal in size.  Pericardium: There is no evidence of pericardial effusion.  Mitral Valve: The mitral valve is normal in structure. Mild mitral valve regurgitation. No evidence of mitral valve stenosis.  Tricuspid Valve: The tricuspid valve is normal in structure. Tricuspid valve regurgitation is not demonstrated. No evidence of tricuspid stenosis.  Aortic Valve: The aortic valve is tricuspid. Aortic valve regurgitation is trivial. Aortic regurgitation PHT measures 1055 msec. No aortic stenosis is present. Aortic valve mean gradient  measures 3.0 mmHg. Aortic valve peak gradient measures 4.8 mmHg. Aortic valve area, by VTI measures 2.83 cm.  Pulmonic Valve: The pulmonic valve was normal in structure. Pulmonic valve regurgitation is not visualized. No evidence of pulmonic stenosis.  Aorta: The aortic root is normal in size and structure.  Venous: The inferior vena cava is normal in size with greater than 50% respiratory variability, suggesting right atrial pressure of 3 mmHg.  IAS/Shunts: No atrial level shunt detected by color flow Doppler.   LEFT VENTRICLE PLAX 2D LVIDd:         5.00 cm         Diastology LVIDs:         3.90 cm         LV e' medial:    5.22 cm/s LV PW:         1.10 cm         LV E/e' medial:  12.8 LV IVS:        1.20 cm         LV e' lateral:   9.14 cm/s LVOT diam:     2.10 cm         LV E/e' lateral: 7.3 LV SV:         69 LV SV Index:   33              2D LVOT Area:     3.46 cm        Longitudinal Strain 2D Strain GLS  -15.9 % LV Volumes (MOD)               (A2C): LV vol d, MOD    105.0 ml      2D Strain GLS  -17.4 % A2C:                           (A3C): LV vol d, MOD    108.5 ml      2D Strain GLS  -13.7 % A4C:                           (A4C): LV vol s, MOD    52.9 ml       2D Strain GLS  -15.7 % A2C:  Avg: LV vol s, MOD    54.4 ml A4C:                           3D Volume EF LV SV MOD A2C:   52.1 ml       LV 3D EF:    Left LV SV MOD A4C:   108.5 ml                   ventricul LV SV MOD BP:    54.3 ml                    ar ejection fraction by 3D volume is 52 %.  3D Volume EF: 3D EF:        52 % LV EDV:       156 ml LV ESV:       74 ml LV SV:        82 ml  RIGHT VENTRICLE RV S prime:     9.90 cm/s TAPSE (M-mode): 2.1 cm  LEFT ATRIUM           Index        RIGHT ATRIUM           Index LA diam:      3.50 cm 1.68 cm/m   RA Area:     10.20 cm LA Vol (A4C): 39.6 ml 18.97 ml/m  RA Volume:   18.70 ml  8.96 ml/m AORTIC VALVE AV Area (Vmax):    2.63  cm AV Area (Vmean):   2.53 cm AV Area (VTI):     2.83 cm AV Vmax:           110.00 cm/s AV Vmean:          73.400 cm/s AV VTI:            0.245 m AV Peak Grad:      4.8 mmHg AV Mean Grad:      3.0 mmHg LVOT Vmax:         83.40 cm/s LVOT Vmean:        53.700 cm/s LVOT VTI:          0.200 m LVOT/AV VTI ratio: 0.82 AI PHT:            1055 msec AR Vena Contracta: 0.30 cm  AORTA Ao Root diam: 4.00 cm Ao Asc diam:  3.70 cm  MITRAL VALVE                    TRICUSPID VALVE MV Area (PHT): 2.17 cm         TR Peak grad:   8.2 mmHg MV Decel Time: 349 msec         TR Vmax:        143.00 cm/s MR Peak grad:      119.7 mmHg MR Mean grad:      83.0 mmHg    SHUNTS MR Vmax:           547.00 cm/s  Systemic VTI:  0.20 m MR Vmean:          432.0 cm/s   Systemic Diam: 2.10 cm MR Vena Contracta: 0.20 cm MR PISA:           0.25 cm MR PISA Eff ROA:   1 mm MR PISA Radius:    0.20 cm MV E velocity: 67.00 cm/s MV A velocity: 111.00 cm/s MV E/A ratio:  0.60  Mihai Croitoru MD Electronically signed by Thurmon Fair MD Signature Date/Time: 12/12/2022/2:14:45 PM    Final    MONITORS  CARDIAC EVENT MONITOR 01/31/2023  Narrative SR/SB/ST No arrhythmias seen       ______________________________________________________________________________________________     Recent Labs: 10/13/2022: TSH 2.18  Recent Lipid Panel    Component Value Date/Time   CHOL 148 09/03/2019 0820   TRIG 330 (H) 09/03/2019 0820   HDL 24 (L) 09/03/2019 0820   CHOLHDL 6.2 (H) 09/03/2019 0820   CHOLHDL 4.8 10/01/2014 0303   VLDL 68 (H) 10/01/2014 0303   LDLCALC 71 09/03/2019 0820    History of Present Illness    78 year old male with the above past medical history including CAD s/p stenting-RCA and LCx in 2002, DES-mid LCx in 2016, DES-LCx in 2018, 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   He has a history of CAD s/p RCA and circumflex intervention by Dr. Aleen Campi in 2002.  He was hospitalized in April 2016 in the setting of unstable angina.  Cardiac catheterization revealed high-grade mid circumflex lesion on a bend and was stented 2 days later by Dr. Clifton James with a Promus Premier drug-eluting stent.  He later experienced nitrate responsive chest pain.  Cardiac catheterization in 11/2016 revealed widely patent proximal circumflex stent, widely patent RCA, 70% lesion in the AV groove circumflex beyond the previously stented segment, s/p DES.  Has a history of right popliteal artery aneurysm s/p right above to below-knee bypass grafting by Dr. Tawanna Cooler early in 11/2018. He was evaluated in the ED in August 2023 with concern for TIA.  Unfortunately, patient left prior to full evaluation though CT of the head was negative for acute process.  MRI was positive for multi-infarct disease.  Echocardiogram in 12/2022 showed EF 50 to 55%, low normal LV function, no RWMA, G1 DD, normal RV systolic function, mild mitral valve regurgitation.  30-day event monitor showed no evidence of atrial fibrillation.  He was last seen in the office on 02/15/2023 and was stable from a cardiac standpoint.    He presents today for follow-up accompanied by his wife.  Since his last visit he has been stable from a cardiac standpoint.  He had 1 brief episode of chest pain that occurred approximately 3 to 4 weeks ago, he took nitroglycerin and his symptoms have resolved completely.  He denies any other symptoms concerning for angina.  He continues to note intermittent dizziness.  BP has been stable.  He denies any palpitations, presyncope, syncope, dyspnea, edema, PND, orthopnea, weight gain.  He remains active, though he reports an increasingly unsteady gait.  He also notes persistent redness in his left eye, with mild swelling and crusting/drainage.  His wife mentions that his MRI showed a mucous retention cyst in the left  maxillary sinus.  She questions whether or not this could be contributing to the symptoms.  Other than his intermittent dizziness, he reports feeling well.  Home Medications    Current Outpatient Medications  Medication Sig Dispense Refill   Apoaequorin (PREVAGEN EXTRA STRENGTH) 20 MG CAPS Take by mouth.     ascorbic acid (VITAMIN C) 1000 MG tablet Take 1,000 mg by mouth daily.     ASPIRIN 81 PO Take 81 mg by mouth daily.     b complex vitamins capsule Take 1 capsule by mouth daily.     Cholecalciferol (VITAMIN D) 50 MCG (2000 UT)  tablet Take 2,000 Units by mouth daily.     clopidogrel (PLAVIX) 75 MG tablet Take 1 tablet by mouth once daily 90 tablet 2   divalproex (DEPAKOTE) 125 MG DR tablet Take 125 mg by mouth 2 (two) times daily.     donepezil (ARICEPT) 10 MG tablet Half tablet at bedtime for first month then increase to whole tablet     fenofibrate 160 MG tablet Take 160 mg by mouth daily.     ipratropium (ATROVENT) 0.03 % nasal spray Place 2 sprays into both nostrils 2 (two) times daily.     levothyroxine (SYNTHROID) 112 MCG tablet Take 112 mcg by mouth every morning.     lipase/protease/amylase (CREON) 36000 UNITS CPEP capsule      magnesium oxide (MAG-OX) 400 MG tablet Take 400 mg by mouth daily.     memantine (NAMENDA) 10 MG tablet Take 1 tablet (10 mg total) by mouth daily. 30 tablet 11   Multiple Vitamins-Minerals (CENTRUM SILVER 50+MEN) TABS      nitroGLYCERIN (NITROSTAT) 0.4 MG SL tablet DISSOLVE ONE TABLET UNDER THE TONGUE EVERY 5 MINUTES AS NEEDED FOR CHEST PAIN 25 tablet 0   pravastatin (PRAVACHOL) 20 MG tablet Take 20 mg by mouth daily.     telmisartan (MICARDIS) 80 MG tablet Take 80 mg by mouth daily.     timolol (TIMOPTIC) 0.5 % ophthalmic solution Place 1 drop into both eyes daily.     carvedilol (COREG) 3.125 MG tablet Take 1 tablet (3.125 mg total) by mouth 2 (two) times daily. 180 tablet 3   Misc Natural Products (NEURIVA PO) Take 1 tablet by mouth daily.      Omeprazole-Sodium Bicarbonate (ZEGERID) 20-1100 MG CAPS capsule Take 1 capsule by mouth daily before breakfast.     Vibegron (GEMTESA) 75 MG TABS Take 75 mg by mouth daily.     No current facility-administered medications for this visit.     Review of Systems    He denies chest pain, palpitations, dyspnea, pnd, orthopnea, n, v, syncope, edema, weight gain, or early satiety. All other systems reviewed and are otherwise negative except as noted above.   Physical Exam    VS:  BP 104/60   Pulse 64   Ht 5\' 7"  (1.702 m)   Wt 186 lb (84.4 kg)   SpO2 95%   BMI 29.13 kg/m   GEN: Well nourished, well developed, in no acute distress. HEENT: normal. Neck: Supple, no JVD, carotid bruits, or masses. Cardiac: RRR, no murmurs, rubs, or gallops. No clubbing, cyanosis, edema.  Radials/DP/PT 2+ and equal bilaterally.  Respiratory:  Respirations regular and unlabored, clear to auscultation bilaterally. GI: Soft, nontender, nondistended, BS + x 4. MS: no deformity or atrophy. Skin: warm and dry, no rash. Neuro:  Strength and sensation are intact. Psych: Normal affect.  Accessory Clinical Findings    ECG personally reviewed by me today - EKG Interpretation Date/Time:  Wednesday August 23 2023 14:30:01 EDT Ventricular Rate:  64 PR Interval:  178 QRS Duration:  138 QT Interval:  432 QTC Calculation: 445 R Axis:   -64  Text Interpretation: Normal sinus rhythm Left axis deviation Right bundle branch block When compared with ECG of 19-Jan-2022 21:10, Premature atrial complexes are no longer Present QT has shortened Confirmed by Bernadene Person ([id number]) on 08/23/2023 2:34:12 PM  - no acute changes.   Lab Results  Component Value Date   WBC 6.7 01/19/2022   HGB 15.3 01/19/2022   HCT 45.0 01/19/2022   MCV 90.4  01/19/2022   PLT 200 01/19/2022   Lab Results  Component Value Date   CREATININE 1.30 (H) 01/19/2022   BUN 25 (H) 01/19/2022   NA 137 01/19/2022   K 4.0 01/19/2022   CL 111 01/19/2022    CO2 22 01/19/2022   Lab Results  Component Value Date   ALT 19 01/19/2022   AST 23 01/19/2022   ALKPHOS 38 01/19/2022   BILITOT 0.9 01/19/2022   Lab Results  Component Value Date   CHOL 148 09/03/2019   HDL 24 (L) 09/03/2019   LDLCALC 71 09/03/2019   TRIG 330 (H) 09/03/2019   CHOLHDL 6.2 (H) 09/03/2019    No results found for: "HGBA1C"  Assessment & Plan    1. CAD: S/p stenting-RCA and LCx in 2002, DES-mid LCx in 2016, DES-LCx in 2018.  He reports one recent episode of brief chest discomfort, completely resolved with nitroglycerin.  He denies any other symptoms concerning for angina. No indication for ischemic evaluation.  Continue Plavix, carvedilol, telmisartan, pravastatin and fenofibrate.   2. Right popliteal artery aneurysm: S/p right above to below the knee bypass grafting in 11/2018.  Follows with vascular surgery.   3. Hypertension/dizziness: BP slightly low in office today, generally well controlled.  He reports occasional orthostatic dizziness, denies any palpitations, presyncope, syncope.  He to monitor symptoms.  For now, continue current antihypertensive regimen.  4. Hyperlipidemia: LDL was 89 in 05/2023.  Monitored and managed per PCP.  Continue pravastatin, fenofibrate.   5. History of CVA: MRI showed no large vessel occlusion.  Echocardiogram was stable at the time (12/2022).  30-day event monitor showed no evidence of atrial fibrillation.  6. Hypothyroidism: TSH was 4.62 in 05/2023.  Monitored and managed per PCP.   7. Dementia: Follows with neurology.    8.  Mucous retention cyst: Noted on prior MRI, located in the left maxillary sinus. He notes persistent redness in his left eye, with mild swelling and crusting/drainage. Recommend follow-up with PCP.  9. Disposition: Follow-up in 6 months with Dr. Allyson Sabal, sooner if needed.       Joylene Grapes, NP 08/23/2023, 7:45 PM

## 2023-09-26 ENCOUNTER — Ambulatory Visit: Payer: Medicare HMO | Admitting: Physician Assistant

## 2023-09-28 DIAGNOSIS — G301 Alzheimer's disease with late onset: Secondary | ICD-10-CM | POA: Diagnosis not present

## 2023-09-28 DIAGNOSIS — I129 Hypertensive chronic kidney disease with stage 1 through stage 4 chronic kidney disease, or unspecified chronic kidney disease: Secondary | ICD-10-CM | POA: Diagnosis not present

## 2023-09-28 DIAGNOSIS — F0280 Dementia in other diseases classified elsewhere without behavioral disturbance: Secondary | ICD-10-CM | POA: Diagnosis not present

## 2023-09-28 DIAGNOSIS — E782 Mixed hyperlipidemia: Secondary | ICD-10-CM | POA: Diagnosis not present

## 2023-09-28 DIAGNOSIS — R7309 Other abnormal glucose: Secondary | ICD-10-CM | POA: Diagnosis not present

## 2023-09-28 DIAGNOSIS — R600 Localized edema: Secondary | ICD-10-CM | POA: Diagnosis not present

## 2023-09-28 DIAGNOSIS — I2510 Atherosclerotic heart disease of native coronary artery without angina pectoris: Secondary | ICD-10-CM | POA: Diagnosis not present

## 2023-09-28 DIAGNOSIS — E039 Hypothyroidism, unspecified: Secondary | ICD-10-CM | POA: Diagnosis not present

## 2023-09-28 DIAGNOSIS — N1831 Chronic kidney disease, stage 3a: Secondary | ICD-10-CM | POA: Diagnosis not present

## 2023-10-03 ENCOUNTER — Other Ambulatory Visit: Payer: Self-pay | Admitting: Physician Assistant

## 2023-10-05 DIAGNOSIS — G301 Alzheimer's disease with late onset: Secondary | ICD-10-CM | POA: Diagnosis not present

## 2023-10-05 DIAGNOSIS — N1831 Chronic kidney disease, stage 3a: Secondary | ICD-10-CM | POA: Diagnosis not present

## 2023-10-05 DIAGNOSIS — F0280 Dementia in other diseases classified elsewhere without behavioral disturbance: Secondary | ICD-10-CM | POA: Diagnosis not present

## 2023-10-05 DIAGNOSIS — R7309 Other abnormal glucose: Secondary | ICD-10-CM | POA: Diagnosis not present

## 2023-10-12 ENCOUNTER — Ambulatory Visit: Admitting: Physician Assistant

## 2023-10-12 VITALS — BP 113/72 | Resp 20 | Wt 188.0 lb

## 2023-10-12 DIAGNOSIS — F0150 Vascular dementia without behavioral disturbance: Secondary | ICD-10-CM

## 2023-10-12 DIAGNOSIS — F0280 Dementia in other diseases classified elsewhere without behavioral disturbance: Secondary | ICD-10-CM | POA: Diagnosis not present

## 2023-10-12 DIAGNOSIS — G309 Alzheimer's disease, unspecified: Secondary | ICD-10-CM

## 2023-10-12 NOTE — Patient Instructions (Addendum)
 It was a pleasure to see you today at our office.          Continue memantine  as prescribed Follow up on April 22, 11:30 am  Increase Depakote to 250 mg at night and can increase to 250 twice a day  Dementia [facility]     For assessment of decision of mental capacity and competency:  Call Dr. Laverne Potter, geriatric psychiatrist at [phone] Counseling regarding caregiver distress, including caregiver depression, anxiety and issues regarding community resources, adult day care programs, adult living facilities, or memory care questions:  please contact your  Primary Doctor's Social Worker  Whom to call: Memory  decline, memory medications: Call our office [phone]  For psychiatric meds, mood meds: Please have your primary care physician manage these medications.  If you have any severe symptoms of a stroke, or other severe issues such as confusion,severe chills or fever, etc call 911 or go to the ER as you may need to be evaluated further    RECOMMENDATIONS FOR ALL PATIENTS WITH MEMORY PROBLEMS: 1. Continue to exercise (Recommend 30 minutes of walking everyday, or 3 hours every week) 2. Increase social interactions - continue going to [place] and enjoy social gatherings with friends and family 3. Eat healthy, avoid fried foods and eat more fruits and vegetables 4. Maintain adequate blood pressure, blood sugar, and blood cholesterol level. Reducing the risk of stroke and cardiovascular disease also helps promoting better memory. 5. Avoid stressful situations. Live a simple life and avoid aggravations. Organize your time and prepare for the next day in anticipation. 6. Sleep well, avoid any interruptions of sleep and avoid any distractions in the bedroom that may interfere with adequate sleep quality 7. Avoid sugar, avoid sweets as there is a strong link between excessive sugar intake, diabetes, and cognitive impairment We discussed the Mediterranean diet, which has been shown to  help patients reduce the risk of progressive memory disorders and reduces cardiovascular risk. This includes eating fish, eat fruits and green leafy vegetables, nuts like almonds and hazelnuts, walnuts, and also use olive oil. Avoid fast foods and fried foods as much as possible. Avoid sweets and sugar as sugar use has been linked to worsening of memory function.  There is always a concern of gradual progression of memory problems. If this is the case, then we may need to adjust level of care according to patient needs. Support, both to the patient and caregiver, should then be put into place.       FALL PRECAUTIONS: Be cautious when walking. Scan the area for obstacles that may increase the risk of trips and falls. When getting up in the mornings, sit up at the edge of the bed for a few minutes before getting out of bed. Consider elevating the bed at the head end to avoid drop of blood pressure when getting up. Walk always in a well-lit room (use night lights in the walls). Avoid area rugs or power cords from appliances in the middle of the walkways. Use a walker or a cane if necessary and consider physical therapy for balance exercise. Get your eyesight checked regularly.  FINANCIAL OVERSIGHT: Supervision, especially oversight when making financial decisions or transactions is also recommended.  HOME SAFETY: Consider the safety of the kitchen when operating appliances like stoves, microwave oven, and blender. Consider having supervision and share cooking responsibilities until no longer able to participate in those. Accidents with firearms and other hazards in the house should be identified and addressed as  well.   ABILITY TO BE LEFT ALONE: If patient is unable to contact 911 operator, consider using LifeLine, or when the need is there, arrange for someone to stay with patients. Smoking is a fire hazard, consider supervision or cessation. Risk of wandering should be assessed by caregiver and if  detected at any point, supervision and safe proof recommendations should be instituted.  MEDICATION SUPERVISION: Inability to self-administer medication needs to be constantly addressed. Implement a mechanism to ensure safe administration of the medications.   DRIVING: Regarding driving, in patients with progressive memory problems, driving will be impaired. We advise to have someone else do the driving if trouble finding directions or if minor accidents are reported. Independent driving assessment is available to determine safety of driving.   If you are interested in the driving assessment, you can contact the following:  [employer] in [place] [phone]  [facility] [phone]  [facility] [phone] or [phone]    Mediterranean Diet A Mediterranean diet refers to food and lifestyle choices that are based on the traditions of countries located on the [employer]. This way of eating has been shown to help prevent certain conditions and improve outcomes for people who have chronic diseases, like kidney disease and heart disease. What are tips for following this plan? Lifestyle  Cook and eat meals together with your family, when possible. Drink enough fluid to keep your urine clear or pale yellow. Be physically active every day. This includes: Aerobic exercise like running or swimming. Leisure activities like gardening, walking, or housework. Get 7-8 hours of sleep each night. If recommended by your health care provider, drink red wine in moderation. This means 1 glass a day for nonpregnant women and 2 glasses a day for men. A glass of wine equals 5 oz (150 mL). Reading food labels  Check the serving size of packaged foods. For foods such as rice and pasta, the serving size refers to the amount of cooked product, not dry. Check the total fat in packaged foods. Avoid foods that have saturated  fat or trans fats. Check the ingredients list for added sugars, such as corn syrup. Shopping  At the grocery store, buy most of your food from the areas near the walls of the store. This includes: Fresh fruits and vegetables (produce). Grains, beans, nuts, and seeds. Some of these may be available in unpackaged forms or large amounts (in bulk). Fresh seafood. Poultry and eggs. Low-fat dairy products. Buy whole ingredients instead of prepackaged foods. Buy fresh fruits and vegetables in-season from local farmers markets. Buy frozen fruits and vegetables in resealable bags. If you do not have access to quality fresh seafood, buy precooked frozen shrimp or canned fish, such as tuna, salmon, or sardines. Buy small amounts of raw or cooked vegetables, salads, or olives from the deli or salad bar at your store. Stock your pantry so you always have certain foods on hand, such as olive oil, canned tuna, canned tomatoes, rice, pasta, and beans. Cooking  Cook foods with extra-virgin olive oil instead of using butter or other vegetable oils. Have meat as a side dish, and have vegetables or grains as your main dish. This means having meat in small portions or adding small amounts of meat to foods like pasta or stew. Use beans or vegetables instead of meat in common dishes like chili or lasagna. Experiment with different cooking methods. Try roasting or broiling vegetables instead of steaming or sauteing them.  Add frozen vegetables to soups, stews, pasta, or rice. Add nuts or seeds for added healthy fat at each meal. You can add these to yogurt, salads, or vegetable dishes. Marinate fish or vegetables using olive oil, lemon juice, garlic, and fresh herbs. Meal planning  Plan to eat 1 vegetarian meal one day each week. Try to work up to 2 vegetarian meals, if possible. Eat seafood 2 or more times a week. Have healthy snacks readily available, such as: Vegetable sticks with hummus. Greek yogurt. Fruit  and nut trail mix. Eat balanced meals throughout the week. This includes: Fruit: 2-3 servings a day Vegetables: 4-5 servings a day Low-fat dairy: 2 servings a day Fish, poultry, or lean meat: 1 serving a day Beans and legumes: 2 or more servings a week Nuts and seeds: 1-2 servings a day Whole grains: 6-8 servings a day Extra-virgin olive oil: 3-4 servings a day Limit red meat and sweets to only a few servings a month What are my food choices? Mediterranean diet Recommended Grains: Whole-grain pasta. Brown rice. Bulgar wheat. Polenta. Couscous. Whole-wheat bread. Dwyane Glad. Vegetables: Artichokes. Beets. Broccoli. Cabbage. Carrots. Eggplant. Green beans. Chard. Kale. Spinach. Onions. Leeks. Peas. Squash. Tomatoes. Peppers. Radishes. Fruits: Apples. Apricots. Avocado. Berries. Bananas. Cherries. Dates. Figs. Grapes. Lemons. Melon. Oranges. Peaches. Plums. Pomegranate. Meats and other protein foods: Beans. Almonds. Sunflower seeds. Pine nuts. Peanuts. Cod. Salmon. Scallops. Shrimp. Tuna. Tilapia. Clams. Oysters. Eggs. Dairy: Low-fat milk. Cheese. Greek yogurt. Beverages: Water. Red wine. Herbal tea. Fats and oils: Extra virgin olive oil. Avocado oil. Grape seed oil. Sweets and desserts: [place] yogurt with honey. Baked apples. Poached pears. Trail mix. Seasoning and other foods: Basil. Cilantro. Coriander. Cumin. Mint. Parsley. Sage. Rosemary. Tarragon. Garlic. Oregano. Thyme. Pepper. Balsalmic vinegar. Tahini. Hummus. Tomato sauce. Olives. Mushrooms. Limit these Grains: Prepackaged pasta or rice dishes. Prepackaged cereal with added sugar. Vegetables: Deep fried potatoes (french fries). Fruits: Fruit canned in syrup. Meats and other protein foods: Beef. Pork. Lamb. Poultry with skin. Hot dogs. Helene Loader. Dairy: Ice cream. Sour cream. Whole milk. Beverages: Juice. Sugar-sweetened soft drinks. Beer. Liquor and spirits. Fats and oils: Butter. Canola oil. Vegetable oil. Beef fat (tallow).  Lard. Sweets and desserts: Cookies. Cakes. Pies. Candy. Seasoning and other foods: Mayonnaise. Premade sauces and marinades. The items listed may not be a complete list. Talk with your dietitian about what dietary choices are right for you. Summary The Mediterranean diet includes both food and lifestyle choices. Eat a variety of fresh fruits and vegetables, beans, nuts, seeds, and whole grains. Limit the amount of red meat and sweets that you eat. Talk with your health care provider about whether it is safe for you to drink red wine in moderation. This means 1 glass a day for nonpregnant women and 2 glasses a day for men. A glass of wine equals 5 oz (150 mL). This information is not intended to replace advice given to you by your health care provider. Make sure you discuss any questions you have with your health care provider. Document Released: 01/14/2016 Document Revised: 02/16/2016 Document Reviewed: 01/14/2016 Elsevier Interactive Patient Education  2017 [employer].   [address] [facility] at [facility] [phone]

## 2023-10-12 NOTE — Progress Notes (Signed)
 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 Patient is currently on donepezil 5 mg daily (unable to tolerate 10 mg due to diarrhea) and memantine  10 mg nightly.  Cognitive decline is noted, and he needs more assistance with ADLs. Suspect wife has caregiver distress , discussed with her the need to obtain either HHN, or Adult Day program, etc for respite. She became very tearful, but agreed that his overall progress of the disease is present .    Follow up in 6  months. Continue donepezil 5 mg daily and memantine  10 mg nightly, side effects discussed Recommend good control of her cardiovascular risk factors.  He takes baby aspirin  daily Continue to control mood as per PCP Continue B12 supplements    Subjective:    This patient is accompanied in the office by his wife who supplements the history.  Previous records as well as any outside records available were reviewed prior to todays visit. Patient was last seen on 03/21/2023.    Any changes in memory since last visit?  Short-term memory and LTM is worse.  He likes to watch some TV games. He still likes to collect miniature cars.  No longer does word finding and crossword puzzles.  His speech is more tangential than prior.  repeats oneself?  Endorsed Disoriented when walking into a room?  Endorsed  "He wants to go home and he is at home" Leaving objects?  May misplace things but not in unusual places,  "Kleenex in different places"  Wandering behavior?  Denies.   Any personality changes since last visit?  He paces more at night.  Any worsening depression?:  Denies.   Hallucinations or  paranoia?  Endorsed, he sees people as before, and has some paranoia "When we go to [place] he thinks everyone is talking about him".   Seizures? denies    Any sleep changes?  As before, he is talking in his sleep, no sleepwalking but "pacing". Sleep apnea?   Denies.   Any hygiene concerns?  Needs to be reminded Independent of bathing and dressing?  Endorsed  Does the patient needs help with medications?  Wife is in charge   Who is in charge of the finances?  Wife is in charge     Any changes in appetite? He eats well "too soon after breakfast"   Patient have trouble swallowing? Denies.   Does the patient cook? No Any headaches?   denies   Chronic back pain  denies   Ambulates with difficulty?  He walks slower, shuffling at times, refuses to use a walker. Recent falls or head injuries? Denies.    Unilateral weakness, numbness or tingling? Denies.   Any tremors?  His wife reports that he has  tremors on intention, not seen today Any anosmia?  Endorsed, for at least 6 years Any incontinence of urine?  Endorsed, wears Depends Any bowel dysfunction?   Denies      Patient lives with wife Does the patient drive? No longer drives     Neuropsychological evaluation 01/30/23 Briefly, results suggested diffuse cognitive impairment. A relative strength was exhibited across both basic attention and phonemic fluency (i.e., below average range) while some variability  was noted across confrontation naming. However, severe cognitive impairment was exhibited across all other assessed cognitive domains. This includes processing speed, executive functioning, receptive language, semantic fluency, visuospatial abilities, and all aspects of learning and memory. The etiology for his dementia presentation would appear mixed in nature. Concerns are reasonable surrounding underlying Alzheimer's disease. Across memory testing, [name] was fully amnestic (i.e., 0% retention) across both verbal memory tasks after a  brief delay and performed poorly across a yes/no recognition trial. He further exhibited minimal retention across a visual memory task. This suggests evidence for rapid forgetting and a pronounced storage impairment, both of which are the hallmark testing patterns for this illness. A prior brain MRI in April 2023 also revealed mild cerebral atrophy, said to be "a little more pronounced" in the medial temporal lobes, which is also a well established risk factor for Alzheimer's disease. 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 The sheer number of distinct strokes spanning numerous brain regions would further create notable cognitive impairment. Overall, a mixed dementia presentation (i.e., Alzheimer's disease with evidence for cerebrovascular disease/stroke) would appear to best explain ongoing deficits.      Initial visit 10/13/2022    How long did patient have memory difficulties?  For at least 2 or 3 years.  He initially was evaluated by his PCP, and around March 2023.  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 He was having a hard time writing numbers in a check. Most of the time if listening carefully he is able to retain conversations and names. No speech difficulties. repeats oneself? Denies  Disoriented when walking into a room?  Patient denies except occasionally not remembering what patient came to the room for   Leaving objects in unusual places?   denies   Wandering behavior? denies   Any personality changes since last visit? denies   Any history of depression?:  he "may feel depressed butI don't have a  diagnosis of depression"  Hallucinations or paranoia?  Sees people when standing at the window and tells his wife :  " in [place] woods , standing there'"  Seizures? denies    Any sleep changes?  Sleeps well.  Has + vivid dreams about his dog , and talks in the sleep "it is very real to me", +REM behavior, no sleepwalking   Sleep apnea? denies   Any hygiene concerns?  He has a schedule for showers  Independent of bathing and dressing?  He needs help with getting dressed, needs some instructions with simple tasks    Does the patient need help with medications? Patient is in charge   Who is in charge of the finances?  Wife is in charge after he was having difficulties writing checks.     Any changes in appetite?   denies     Patient have trouble swallowing?  denies   Does the patient cook?  Wife does the cooking. Any headaches?  denies   Chronic back pain?  denies   Ambulates with difficulty? denies   Recent falls or head injuries? 4y ago he slipped from a boat trailer and hit the head on the  trailer needing staples.    Vision changes? Cataracts and astigmatism  Unilateral weakness, numbness or tingling?  denies   Any tremors?  Occasionally, not often when picking an object.  Any anosmia?  "a little bit" more than 4 y ago  Any incontinence of urine? Endorsed,  OAB, has to be reminded to go to the bathroom before" having an accident ". May need Depends at night  Any bowel dysfunction?    denies      Patient lives  wife  History of heavy alcohol intake? denies   History of heavy tobacco use? denies   Family history of dementia?  Mo with vascular dementia  and his sister with AD. Fa possible dementia ? type Does patient drive?  Short distances  He was a Key note speaker and now he has trouble with words.      MRI of the brain performed on 12/05/2022, personally reviewed, were remarkable for punctate subacute cortical infarct in the posterior left frontal lobe, as well as moderate chronic  microvascular ischemic changes of the white matter, multiple remote infarcts and moderate parenchymal volume loss    Other studies: Carotid US  is unremarkable, with patent carotids. 2 D echo EF 50-55% with Gr I LVF    30 day Holter monitor was negative  for cardioembolic source of subacute strokes   PREVIOUS MEDICATIONS:   CURRENT MEDICATIONS:  Outpatient Encounter Medications as of 10/12/2023  Medication Sig   Apoaequorin (PREVAGEN EXTRA STRENGTH) 20 MG CAPS Take by mouth.   ascorbic acid  (VITAMIN C ) 1000 MG tablet Take 1,000 mg by mouth daily.   ASPIRIN  81 PO Take 81 mg by mouth daily.   b complex vitamins capsule Take 1 capsule by mouth daily.   carvedilol  (COREG ) 3.125 MG tablet Take 1 tablet (3.125 mg total) by mouth 2 (two) times daily.   Cholecalciferol  (VITAMIN D ) 50 MCG (2000 UT) tablet Take 2,000 Units by mouth daily.   clopidogrel  (PLAVIX ) 75 MG tablet Take 1 tablet by mouth once daily   divalproex (DEPAKOTE) 125 MG DR tablet Take 125 mg by mouth 2 (two) times daily.   donepezil (ARICEPT) 10 MG tablet Half tablet at bedtime for first month then increase to whole tablet   fenofibrate  160 MG tablet Take 160 mg by mouth daily.   ipratropium (ATROVENT) 0.03 % nasal spray Place 2 sprays into both nostrils 2 (two) times daily.   levothyroxine  (SYNTHROID ) 112 MCG tablet Take 112 mcg by mouth every morning.   lipase/protease/amylase (CREON) 36000 UNITS CPEP capsule    magnesium  oxide (MAG-OX) 400 MG tablet Take 400 mg by mouth daily.   memantine  (NAMENDA ) 10 MG tablet Take 1 tablet by mouth once daily   Multiple Vitamins-Minerals (CENTRUM SILVER 50+MEN) TABS    nitroGLYCERIN  (NITROSTAT ) 0.4 MG SL tablet DISSOLVE ONE TABLET UNDER THE TONGUE EVERY 5 MINUTES AS NEEDED FOR CHEST PAIN   pravastatin (PRAVACHOL) 20 MG tablet Take 20 mg by mouth daily.   telmisartan (MICARDIS) 80 MG tablet Take 80 mg by mouth daily.   timolol (TIMOPTIC) 0.5 % ophthalmic solution Place 1 drop into both eyes  daily.   [DISCONTINUED] Misc Natural Products (NEURIVA PO) Take 1 tablet by mouth daily.   [DISCONTINUED] Omeprazole-Sodium Bicarbonate (ZEGERID) 20-1100 MG CAPS capsule Take 1 capsule by mouth daily before breakfast.   [DISCONTINUED] Vibegron (GEMTESA) 75 MG TABS Take 75 mg by mouth daily.   No facility-administered encounter medications on file as of 10/12/2023.  No data to display            10/13/2022    4:00 PM 08/31/2021    2:19 PM  Montreal Cognitive Assessment   Visuospatial/ Executive (0/5) 1 0  Naming (0/3) 3 3  Attention: Read list of digits (0/2) 2 1  Attention: Read list of letters (0/1) 1 1  Attention: Serial 7 subtraction starting at 100 (0/3) 0 1  Language: Repeat phrase (0/2) 0 2  Language : Fluency (0/1) 1 1  Abstraction (0/2) 1 2  Delayed Recall (0/5) 0 0  Orientation (0/6) 3 6  Total 12 17  Adjusted Score (based on education) 12 17    Objective:     PHYSICAL EXAMINATION:    VITALS:   Vitals:   10/12/23 1454  BP: 113/72  Resp: 20  Weight: 188 lb (85.3 kg)    GEN:  The patient appears stated age and is in NAD. HEENT:  Normocephalic, atraumatic.   Neurological examination:  General: NAD, well-groomed, appears stated age. Orientation: The patient is alert. Oriented to person, not to place and date Cranial nerves: There is good facial symmetry.The speech is fluent and clear but very tangential.  No aphasia or dysarthria. Fund of knowledge is reduced. Recent and remote memory are impaired. Attention and concentration are reduced. Unable to name objects and repeat phrases.  Hearing is intact to conversational tone.  Sensation: Sensation is intact to light touch throughout Motor: Strength is at least antigravity x4. DTR's 2/4 in UE/LE     Movement examination: Tone: There is normal tone in the UE/LE Abnormal movements:  no tremor.  No myoclonus.  No asterixis.   Coordination:  There is decremation with RAM's, not comprehending  instructions.Abnormal finger to nose  Gait and Station: The patient has some difficulty arising out of a deep-seated chair without the use of the hands. The patient's stride length is short flexes forward.  Gait is cautious and narrow.    Thank you for allowing us  the opportunity to participate in the care of this nice patient. Please do not hesitate to contact us  for any questions or concerns.   Total time spent on today's visit was 34 minutes dedicated to this patient today, preparing to see patient, examining the patient, ordering tests and/or medications and counseling the patient, documenting clinical information in the EHR or other health record, independently interpreting results and communicating results to the patient/family, discussing treatment and goals, answering patient's questions and coordinating care.  Cc:  Virgle Grime, MD  Tex Filbert 10/12/2023 8:27 PM

## 2023-10-31 ENCOUNTER — Other Ambulatory Visit: Payer: Self-pay | Admitting: Physician Assistant

## 2023-11-07 ENCOUNTER — Telehealth: Payer: Self-pay | Admitting: Physician Assistant

## 2023-11-07 ENCOUNTER — Other Ambulatory Visit: Payer: Self-pay | Admitting: Physician Assistant

## 2023-11-07 MED ORDER — DIVALPROEX SODIUM 125 MG PO DR TAB: DELAYED_RELEASE_TABLET | ORAL | 3 refills | Status: AC

## 2023-11-07 NOTE — Telephone Encounter (Signed)
 Hey, wife is requesting Rx to be up'ed dosage, lots of aggravation and sundowning. Please advise to call after lunch.

## 2023-11-07 NOTE — Telephone Encounter (Signed)
 Pt. Wife wants to discuss the change of dosage for Rx DEPAKOTE

## 2023-11-07 NOTE — Telephone Encounter (Signed)
 Depakote 125mg  bid is the current dose at this time.

## 2023-11-08 NOTE — Telephone Encounter (Signed)
 I advised per sara to increase depakote, she thanked me for calling.

## 2023-11-13 ENCOUNTER — Other Ambulatory Visit: Payer: Self-pay | Admitting: Nurse Practitioner

## 2023-12-03 ENCOUNTER — Other Ambulatory Visit: Payer: Self-pay | Admitting: Physician Assistant

## 2023-12-26 DIAGNOSIS — K080 Exfoliation of teeth due to systemic causes: Secondary | ICD-10-CM | POA: Diagnosis not present

## 2024-02-10 ENCOUNTER — Other Ambulatory Visit: Payer: Self-pay | Admitting: Cardiovascular Disease

## 2024-02-15 ENCOUNTER — Encounter: Payer: Self-pay | Admitting: Cardiovascular Disease

## 2024-02-28 ENCOUNTER — Encounter: Payer: Self-pay | Admitting: Cardiovascular Disease

## 2024-02-28 ENCOUNTER — Ambulatory Visit: Attending: Cardiology | Admitting: Cardiovascular Disease

## 2024-02-28 VITALS — BP 106/68 | HR 71 | Ht 67.0 in | Wt 181.4 lb

## 2024-02-28 DIAGNOSIS — I10 Essential (primary) hypertension: Secondary | ICD-10-CM | POA: Diagnosis not present

## 2024-02-28 DIAGNOSIS — E785 Hyperlipidemia, unspecified: Secondary | ICD-10-CM | POA: Diagnosis not present

## 2024-02-28 DIAGNOSIS — Z9861 Coronary angioplasty status: Secondary | ICD-10-CM

## 2024-02-28 DIAGNOSIS — I724 Aneurysm of artery of lower extremity: Secondary | ICD-10-CM

## 2024-02-28 DIAGNOSIS — E782 Mixed hyperlipidemia: Secondary | ICD-10-CM

## 2024-02-28 DIAGNOSIS — I2510 Atherosclerotic heart disease of native coronary artery without angina pectoris: Secondary | ICD-10-CM

## 2024-02-28 NOTE — Patient Instructions (Signed)

## 2024-02-28 NOTE — Progress Notes (Signed)
 02/28/2024 Dennis [name]   December 06, 1945  [id number]  Primary Physician Verdia Lombard, MD Primary Cardiologist: Dorn [name] MD [facility] [name], [name]  HPI:  Dennis [name] is a 78 y.o.  moderately overweight married Caucasian male with no children who is retired from traveling Presenter, broadcasting. He is a patient of Dr. Verdia   I last saw him in the office 02/15/2023.[name]  He is accompanied by his wife Dennis [name] today.  History of hyperlipidemia and CAD status post RCA and circumflex intervention by Dr. Bulah back in 2002. He was admitted to [facility] 09/30/14 with unstable angina. He underwent cardiac catheterization that day by Dr. Burnard revealing a high-grade mid circumflex lesion on a bend and was stented 2 days later by Dr. Barbette  with a Promus premiere drug eluting stent postdilated with a 4.5 mm balloon. When I saw him 3 months ago he had experienced several episodes of nitrate responsive chest pain. I ultimately decided to perform outpatient cardiac catheterization. The right radial approach on 11/17/16 revealing a widely patent proximal circumflex stent, widely patent RCA with 70% lesion in the AV groove circumflex beyond the previously stented segment. I performed FFR which was 0.7 suggesting this was physiologically significant and stented that with a 3 mm x 12 mm long synergy drug-eluting stent post dilating up to 3.25 mm. This pain has since    He had developed  pain in his right leg with some swelling.  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  He ultimately had right above to below the knee bypass grafting by Dr. Krystal Early 11/30/2018 which he has still slowly healing from.    He went to the emergency room on 04/08/2020 with chest pain after raking leaves and back and then that day which is unusually strenuous activity for him.  His enzymes were negative and his EKG showed no acute changes.  He was seen  by Dr.Nipp, cardiology fellow, who assessed him and thought he was stable for discharge.  He did add amlodipine  2.5 mg a day.     Since I saw him in the office a year ago he has done well from a heart point of view.  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   He denies chest pain or shortness of breath.  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   Current Meds  Medication Sig   Apoaequorin (PREVAGEN EXTRA STRENGTH) 20 MG CAPS Take by mouth.   ascorbic acid  (VITAMIN C ) 1000 MG tablet Take 1,000 mg by mouth daily.   ASPIRIN  81 PO Take 81 mg by mouth daily.   b complex vitamins capsule Take 1 capsule by mouth daily.   carvedilol  (COREG ) 3.125 MG tablet Take 1 tablet by mouth twice daily   Cholecalciferol  (VITAMIN D ) 50 MCG (2000 UT) tablet Take 2,000 Units by mouth daily.   clopidogrel  (PLAVIX ) 75 MG tablet Take 1 tablet by mouth once daily   divalproex  (DEPAKOTE ) 125 MG DR tablet Take 2 tabs at night, one tab in the morning   donepezil (ARICEPT) 10 MG tablet Half tablet at bedtime for first month then increase to whole tablet   fenofibrate  160 MG tablet Take 160 mg by mouth daily.   ipratropium (ATROVENT) 0.03 % nasal  spray Place 2 sprays into both nostrils 2 (two) times daily.   levothyroxine  (SYNTHROID ) 112 MCG tablet Take 112 mcg by mouth every morning.   lipase/protease/amylase (CREON) 36000 UNITS CPEP capsule    magnesium  oxide (MAG-OX) 400 MG tablet Take 400 mg by mouth daily.   memantine  (NAMENDA ) 10 MG tablet Take 1 tablet by mouth once daily   Multiple Vitamins-Minerals (CENTRUM SILVER 50+MEN) TABS    nitroGLYCERIN  (NITROSTAT ) 0.4 MG SL tablet DISSOLVE ONE TABLET UNDER THE TONGUE EVERY 5 MINUTES AS NEEDED FOR CHEST PAIN   pravastatin (PRAVACHOL) 20 MG tablet Take 20 mg by mouth daily.    telmisartan (MICARDIS) 80 MG tablet Take 80 mg by mouth daily.     Allergies  Allergen Reactions   Sulfacetamide Sodium     Other reaction(s): Unknown    Social History   Socioeconomic History   Marital status: Married    Spouse name: Not on file   Number of children: 0   Years of education: 16   Highest education level: Bachelor's degree (e.g., BA, AB, BS)  Occupational History   Occupation: Retired  Tobacco Use   Smoking status: Former    Current packs/day: 0.00    Average packs/day: 1 pack/day for 40.0 years (40.0 ttl pk-yrs)    Types: Cigarettes    Start date: 03/06/1961    Quit date: 03/06/2001    Years since quitting: 22.9   Smokeless tobacco: Never  Vaping Use   Vaping status: Never Used  Substance and Sexual Activity   Alcohol use: Not Currently   Drug use: No   Sexual activity: Not on file  Other Topics Concern   Not on file  Social History Narrative   Right handed   Drink caffeine   Lives with wife Dennis [name]   1 floor ranch home   retired   [employer] investment banker Strain: Not on file  Food Insecurity: Not on file  Transportation Needs: Not on file  Physical Activity: Not on file  Stress: Not on file  Social Connections: Not on file  Intimate Partner Violence: Not on file     Review of Systems: General: negative for chills, fever, night sweats or weight changes.  Cardiovascular: negative for chest pain, dyspnea on exertion, edema, orthopnea, palpitations, paroxysmal nocturnal dyspnea or shortness of breath Dermatological: negative for rash Respiratory: negative for cough or wheezing Urologic: negative for hematuria Abdominal: negative for nausea, vomiting, diarrhea, bright red blood per rectum, melena, or hematemesis Neurologic: negative for visual changes, syncope, or dizziness All other systems reviewed and are otherwise negative except as noted above.    Blood pressure 106/68, pulse 71, height 5' 7 (1.702 m), weight 181 lb  6.4 oz (82.3 kg), SpO2 92%.  General appearance: alert and no distress Neck: no adenopathy, no carotid bruit, no JVD, supple, symmetrical, trachea midline, and thyroid  not enlarged, symmetric, no tenderness/mass/nodules Lungs: clear to auscultation bilaterally Heart: regular rate and rhythm, S1, S2 normal, no murmur, click, rub or gallop Extremities: extremities normal, atraumatic, no cyanosis or edema Pulses: 2+ and symmetric Skin: Skin color, texture, turgor normal. No rashes or lesions Neurologic: Grossly normal  EKG not performed today      ASSESSMENT AND PLAN:   CAD S/P multiple PCIs History of CAD status post RCA stenting and circumflex intervention by Dr. Bulah back in 2002.  Dr. Verlin stented high-grade circumflex in a band/28/16.  I cathed him 11/17/2016 revealing a widely patent proximal circumflex stent, widely patent  RCA stent with 70% lesion in the AV groove circumflex beyond the previously stented segment.  I performed FFR which was 0.7 and ultimately stented this with a 3 mm x 12 mm long Synergy drug-eluting stent postdilated to 3.25 mm.  He has been stable since and denies chest pain.  Essential hypertension History of essential hypertension with blood pressure measured today 106/68.  He is on carvedilol  and Micardis.  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 Krystal early 11/30/2018.  Hyperlipidemia History of hyperlipidemia on statin therapy with lipid profile performed 01/31/2023 revealing a total cholesterol 133, LDL of 84 and HDL 32.     Dorn DOROTHA [name] MD [facility], [facility] 02/28/2024 4:37 PM

## 2024-02-28 NOTE — Assessment & Plan Note (Signed)
 History of hyperlipidemia on statin therapy with lipid profile performed 01/31/2023 revealing a total cholesterol 133, LDL of 84 and HDL 32.

## 2024-02-28 NOTE — Assessment & Plan Note (Signed)
 History of popliteal artery aneurysm status post right above to below the knee bypass grafting by Dr. Krystal early 11/30/2018.

## 2024-02-28 NOTE — Assessment & Plan Note (Signed)
 History of CAD status post RCA stenting and circumflex intervention by Dr. Bulah back in 2002.  Dr. Verlin stented high-grade circumflex in a band/28/16.  I cathed him 11/17/2016 revealing a widely patent proximal circumflex stent, widely patent RCA stent with 70% lesion in the AV groove circumflex beyond the previously stented segment.  I performed FFR which was 0.7 and ultimately stented this with a 3 mm x 12 mm long Synergy drug-eluting stent postdilated to 3.25 mm.  He has been stable since and denies chest pain.

## 2024-02-28 NOTE — Assessment & Plan Note (Signed)
 History of essential hypertension with blood pressure measured today 106/68.  He is on carvedilol  and Micardis.

## 2024-03-13 DIAGNOSIS — L570 Actinic keratosis: Secondary | ICD-10-CM | POA: Diagnosis not present

## 2024-03-13 DIAGNOSIS — Z85828 Personal history of other malignant neoplasm of skin: Secondary | ICD-10-CM | POA: Diagnosis not present

## 2024-03-13 DIAGNOSIS — L821 Other seborrheic keratosis: Secondary | ICD-10-CM | POA: Diagnosis not present

## 2024-03-26 DIAGNOSIS — K080 Exfoliation of teeth due to systemic causes: Secondary | ICD-10-CM | POA: Diagnosis not present

## 2024-04-01 NOTE — Progress Notes (Signed)
 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 RH male with a history of retired editor, commissioning with a history of hypertension, hyperlipidemia, CAD status post MI, hypothyroidism and a diagnosis of dementia likely due to Alzheimer disease and vascular etiology seen today in follow up for memory loss. Patient is currently on donepezil 5 mg daily (unable to tolerate 10 mg due to diarrhea) and memantine  10 mg nightly***.  Cognitive decline is noted, needs more assistance with ADLs.  Follow up in   months. Continue donepezil 5 mg daily and memantine  10 mg nightly, side effects discussed*** Recommend good control of her cardiovascular risk factors Continue to control mood as per PCP Continue Depakote  for sundowning Recommend age HTN or adult day program for patient, suspect wife has caregiver distress syndrome and this could help her emotionally.    Subjective:    This patient is accompanied in the office by *** who supplements the history.  Previous records as well as any outside records available were reviewed prior to todays visit. Patient was last seen on ***   Any changes in memory since last visit? Dennis [name]  Both short-term and long-term memory are affected.  Likes to watch some TV game shows, still likes to collect miniature cars.  Speech is more tangential than prior. repeats oneself?  Endorsed Disoriented when walking into a room?  Endorsed, he tells his wife he wants to go home and he is at home ***  Leaving objects?  He leaves Kleenex in different places ***  Wandering behavior?  denies   Any personality changes since last visit?  He has moments of sundowning, better controlled with Depakote  Any worsening depression?:  Denies.   Hallucinations or paranoia?  Denies.   Seizures? denies    Any sleep changes?  Sleeps better with Depakote .  He may talk in his sleep without other REM  behavior. Sleep apnea?   Denies.   Any hygiene concerns?  Needs to be reminded Independent of bathing and dressing?  Endorsed  Does the patient needs help with medications?  If is in charge *** Who is in charge of the finances?  Wife is in charge   *** Any changes in appetite?  Eats very well.***   Patient have trouble swallowing? Denies.   Does the patient cook? No Any headaches?   denies   Any vision changes?*** Chronic back pain  denies   Ambulates with difficulty? Denies.  Walks slowly.*** Recent falls or head injuries? Denies.     Unilateral weakness, numbness or tingling? denies   Any tremors?  His wife reports that he has intention tremors, but these are not seen today*** Any anosmia?  For at least 7 years. Any incontinence of urine?  Endorsed, wears depends***  Any bowel dysfunction?   Denies      Patient lives with wife   *** Does the patient drive? No longer drives ***  Neuropsychological evaluation 01/30/23 Briefly, results suggested diffuse cognitive impairment. A relative strength was exhibited across both basic attention and phonemic fluency (i.e., below average range) while some variability was noted across confrontation naming. However, severe cognitive impairment was exhibited across all other assessed cognitive domains. This includes processing speed, executive functioning, receptive language, semantic fluency, visuospatial abilities, and all aspects of learning and memory. The etiology for his dementia presentation would appear mixed in nature. Concerns are reasonable surrounding underlying Alzheimer's disease. Across memory testing, Dennis [name] was fully  amnestic (i.e., 0% retention) across both verbal memory tasks after a brief delay and performed poorly across a yes/no recognition trial. He further exhibited minimal retention across a visual memory task. This suggests evidence for rapid forgetting and a pronounced storage impairment, both of which are the hallmark testing  patterns for this illness. A prior brain MRI in April 2023 also revealed mild cerebral atrophy, said to be a little more pronounced in the medial temporal lobes, which is also a well established risk factor for Alzheimer's disease. 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 The sheer number of distinct strokes spanning numerous brain regions would further create notable cognitive impairment. Overall, a mixed dementia presentation (i.e., Alzheimer's disease with evidence for cerebrovascular disease/stroke) would appear to best explain ongoing deficits.  Initial visit 10/13/2022    How long did patient have memory difficulties?  For at least 2 or 3 years.  He initially was evaluated by his PCP, and around March 2023.  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 He was having a hard time writing numbers in a check. Most of the time if listening carefully he is able to retain conversations and names. No speech difficulties. repeats oneself? Denies  Disoriented when walking into a room?  Patient denies except occasionally not remembering what patient came to the room for   Leaving objects in unusual places?   denies   Wandering behavior? denies   Any personality changes since last visit? denies   Any history of depression?:  he may feel depressed butI don't have a diagnosis of depression  Hallucinations or paranoia?  Sees people when standing at the window and tells his wife :   in the woods , standing there'  Seizures? denies    Any sleep changes?  Sleeps well.  Has + vivid dreams about his dog , and talks in the  sleep it is very real to me, +REM behavior, no sleepwalking   Sleep apnea? denies   Any hygiene concerns?  He has a schedule for showers  Independent of bathing and dressing?  He needs help with getting dressed, needs some instructions with simple tasks    Does the patient need help with medications? Patient is in charge   Who is in charge of the finances?  Wife is in charge after he was having difficulties writing checks.     Any changes in appetite?   denies     Patient have trouble swallowing?  denies   Does the patient cook?  Wife does the cooking. Any headaches?  denies   Chronic back pain?  denies   Ambulates with difficulty? denies   Recent falls or head injuries? 4y ago he slipped from a boat trailer and hit the head on the trailer needing staples.    Vision changes? Cataracts and astigmatism  Unilateral weakness, numbness or tingling?  denies   Any tremors?  Occasionally, not often when picking an object.  Any anosmia?  a little bit more than 4 y ago  Any incontinence of urine? Endorsed,  OAB, has to be reminded to go to the bathroom before having an accident .  May need Depends at night  Any bowel dysfunction?    denies      Patient lives  wife  History of heavy alcohol intake? denies   History of heavy tobacco use? denies   Family history of dementia?  Mo with vascular dementia  and his sister with AD. Fa possible dementia ? type Does patient drive?  Short distances  He was a Key note speaker and now he has trouble with words.      MRI of the brain performed on 12/05/2022, personally reviewed, were remarkable for punctate subacute cortical infarct in the posterior left frontal lobe, as well as moderate chronic microvascular ischemic changes of the white matter, multiple remote infarcts and moderate parenchymal volume loss    Other studies: Carotid US  is unremarkable, with patent carotids. 2 D echo EF 50-55% with Gr I LVF    30 day Holter monitor was negative  for  cardioembolic source of subacute strokes    PREVIOUS MEDICATIONS:   CURRENT MEDICATIONS:  Outpatient Encounter Medications as of 04/02/2024  Medication Sig   Apoaequorin (PREVAGEN EXTRA STRENGTH) 20 MG CAPS Take by mouth.   ascorbic acid  (VITAMIN C ) 1000 MG tablet Take 1,000 mg by mouth daily.   ASPIRIN  81 PO Take 81 mg by mouth daily.   b complex vitamins capsule Take 1 capsule by mouth daily.   carvedilol  (COREG ) 3.125 MG tablet Take 1 tablet by mouth twice daily   Cholecalciferol  (VITAMIN D ) 50 MCG (2000 UT) tablet Take 2,000 Units by mouth daily.   clopidogrel  (PLAVIX ) 75 MG tablet Take 1 tablet by mouth once daily   divalproex  (DEPAKOTE ) 125 MG DR tablet Take 2 tabs at night, one tab in the morning   donepezil (ARICEPT) 10 MG tablet Half tablet at bedtime for first month then increase to whole tablet   fenofibrate  160 MG tablet Take 160 mg by mouth daily.   ipratropium (ATROVENT) 0.03 % nasal spray Place 2 sprays into both nostrils 2 (two) times daily.   levothyroxine  (SYNTHROID ) 112 MCG tablet Take 112 mcg by mouth every morning.   lipase/protease/amylase (CREON) 36000 UNITS CPEP capsule    magnesium  oxide (MAG-OX) 400 MG tablet Take 400 mg by mouth daily.   memantine  (NAMENDA ) 10 MG tablet Take 1 tablet by mouth once daily   Multiple Vitamins-Minerals (CENTRUM SILVER 50+MEN) TABS    nitroGLYCERIN  (NITROSTAT ) 0.4 MG SL tablet DISSOLVE ONE TABLET UNDER THE TONGUE EVERY 5 MINUTES AS NEEDED FOR CHEST PAIN   pravastatin (PRAVACHOL) 20 MG tablet Take 20 mg by mouth daily.   telmisartan (MICARDIS) 80 MG tablet Take 80 mg by mouth daily.   No facility-administered encounter medications on file as of 04/02/2024.        No data to display            10/13/2022    4:00 PM 08/31/2021    2:19 PM  Montreal Cognitive Assessment   Visuospatial/ Executive (0/5) 1 0  Naming (0/3) 3 3  Attention: Read list of digits (0/2) 2 1  Attention: Read list of letters (0/1) 1 1  Attention: Serial  7 subtraction starting at 100 (0/3) 0 1  Language: Repeat phrase (0/2) 0 2  Language : Fluency (0/1) 1 1  Abstraction (0/2) 1 2  Delayed Recall (0/5) 0 0  Orientation (0/6) 3 6  Total 12 17  Adjusted Score (based on education) 12 17    Objective:     PHYSICAL EXAMINATION:    VITALS:  There were no vitals filed for this visit.  GEN:  The patient appears stated age and is in NAD. HEENT:  Normocephalic, atraumatic.   Neurological examination:  General: NAD, well-groomed, appears stated age. Orientation: The patient is alert. Oriented to person, not to place and date Cranial nerves: There is good facial symmetry.The speech is fluent and clear, very tangential. No aphasia or dysarthria. Fund of knowledge is appropriate. Recent and remote memory are impaired. Attention and concentration are reduced.  I able to name objects and repeat phrases.  Hearing is intact to conversational tone. *** Sensation: Sensation is intact to light touch throughout Motor: Strength is at least antigravity x4. DTR's 2/4 in UE/LE     Movement examination: Tone: There is normal tone in the UE/LE Abnormal movements:  no tremor.  No myoclonus.  No asterixis.   Coordination:  There is decremation with RAM's.  Abnormal finger to nose  Gait and Station: The patient has no*** difficulty arising out of a deep-seated chair without the use of the hands. The patient's stride length is short, flexes forward.  Gait is cautious and narrow.    Thank you for allowing us  the opportunity to participate in the care of this nice patient. Please do not hesitate to contact us  for any questions or concerns.   Total time spent on today's visit was *** minutes dedicated to this patient today, preparing to see patient, examining the patient, ordering tests and/or medications and counseling the patient, documenting clinical information in the EHR or other health record, independently interpreting results and communicating results to  the patient/family, discussing treatment and goals, answering patient's questions and coordinating care.  Cc:  Verdia Lombard, MD  Camie Sevin 04/01/2024 6:38 AM

## 2024-04-02 ENCOUNTER — Encounter: Payer: Self-pay | Admitting: Physician Assistant

## 2024-04-02 ENCOUNTER — Ambulatory Visit (INDEPENDENT_AMBULATORY_CARE_PROVIDER_SITE_OTHER): Admitting: Physician Assistant

## 2024-04-02 VITALS — BP 120/74 | HR 78 | Resp 18 | Ht 67.0 in

## 2024-04-02 DIAGNOSIS — F0280 Dementia in other diseases classified elsewhere without behavioral disturbance: Secondary | ICD-10-CM | POA: Diagnosis not present

## 2024-04-02 DIAGNOSIS — G309 Alzheimer's disease, unspecified: Secondary | ICD-10-CM | POA: Diagnosis not present

## 2024-04-02 DIAGNOSIS — F0150 Vascular dementia without behavioral disturbance: Secondary | ICD-10-CM

## 2024-04-02 NOTE — Patient Instructions (Addendum)
 It was a pleasure to see you today at our office.     Continue the current medications Recommend palliative care involvement  [facility]      RECOMMENDATIONS FOR ALL PATIENTS WITH MEMORY PROBLEMS: 1. Continue to exercise (Recommend 30 minutes of walking everyday, or 3 hours every week) 2. Increase social interactions - continue going to [place] and enjoy social gatherings with friends and family 3. Eat healthy, avoid fried foods and eat more fruits and vegetables 4. Maintain adequate blood pressure, blood sugar, and blood cholesterol level. Reducing the risk of stroke and cardiovascular disease also helps promoting better memory. 5. Avoid stressful situations. Live a simple life and avoid aggravations. Organize your time and prepare for the next day in anticipation. 6. Sleep well, avoid any interruptions of sleep and avoid any distractions in the bedroom that may interfere with adequate sleep quality 7. Avoid sugar, avoid sweets as there is a strong link between excessive sugar intake, diabetes, and cognitive impairment We discussed the Mediterranean diet, which has been shown to help patients reduce the risk of progressive memory disorders and reduces cardiovascular risk. This includes eating fish, eat fruits and green leafy vegetables, nuts like almonds and hazelnuts, walnuts, and also use olive oil. Avoid fast foods and fried foods as much as possible. Avoid sweets and sugar as sugar use has been linked to worsening of memory function.  There is always a concern of gradual progression of memory problems. If this is the case, then we may need to adjust level of care according to patient needs. Support, both to the patient and caregiver, should then be put into place.       FALL PRECAUTIONS: Be cautious when walking. Scan the area for obstacles that may increase the risk of trips and falls. When getting up in the mornings, sit up at the edge of the bed for a few minutes before  getting out of bed. Consider elevating the bed at the head end to avoid drop of blood pressure when getting up. Walk always in a well-lit room (use night lights in the walls). Avoid area rugs or power cords from appliances in the middle of the walkways. Use a walker or a cane if necessary and consider physical therapy for balance exercise. Get your eyesight checked regularly.  FINANCIAL OVERSIGHT: Supervision, especially oversight when making financial decisions or transactions is also recommended.  HOME SAFETY: Consider the safety of the kitchen when operating appliances like stoves, microwave oven, and blender. Consider having supervision and share cooking responsibilities until no longer able to participate in those. Accidents with firearms and other hazards in the house should be identified and addressed as well.   ABILITY TO BE LEFT ALONE: If patient is unable to contact 911 operator, consider using LifeLine, or when the need is there, arrange for someone to stay with patients. Smoking is a fire hazard, consider supervision or cessation. Risk of wandering should be assessed by caregiver and if detected at any point, supervision and safe proof recommendations should be instituted.  MEDICATION SUPERVISION: Inability to self-administer medication needs to be constantly addressed. Implement a mechanism to ensure safe administration of the medications.   DRIVING: Regarding driving, in patients with progressive memory problems, driving will be impaired. We advise to have someone else do the driving if trouble finding directions or if minor accidents are reported. Independent driving assessment is available to determine safety of driving.   If you are interested in the driving assessment, you can contact  the following:  [employer] in [place] [phone]  [facility] [phone]  [facility] [phone] or  [phone]    Mediterranean Diet A Mediterranean diet refers to food and lifestyle choices that are based on the traditions of countries located on the [employer]. This way of eating has been shown to help prevent certain conditions and improve outcomes for people who have chronic diseases, like kidney disease and heart disease. What are tips for following this plan? Lifestyle  Cook and eat meals together with your family, when possible. Drink enough fluid to keep your urine clear or pale yellow. Be physically active every day. This includes: Aerobic exercise like running or swimming. Leisure activities like gardening, walking, or housework. Get 7-8 hours of sleep each night. If recommended by your health care provider, drink red wine in moderation. This means 1 glass a day for nonpregnant women and 2 glasses a day for men. A glass of wine equals 5 oz (150 mL). Reading food labels  Check the serving size of packaged foods. For foods such as rice and pasta, the serving size refers to the amount of cooked product, not dry. Check the total fat in packaged foods. Avoid foods that have saturated fat or trans fats. Check the ingredients list for added sugars, such as corn syrup. Shopping  At the grocery store, buy most of your food from the areas near the walls of the store. This includes: Fresh fruits and vegetables (produce). Grains, beans, nuts, and seeds. Some of these may be available in unpackaged forms or large amounts (in bulk). Fresh seafood. Poultry and eggs. Low-fat dairy products. Buy whole ingredients instead of prepackaged foods. Buy fresh fruits and vegetables in-season from local farmers markets. Buy frozen fruits and vegetables in resealable bags. If you do not have access to quality fresh seafood, buy precooked frozen shrimp or canned fish, such as tuna, salmon, or sardines. Buy small amounts of raw or cooked vegetables, salads, or olives from the deli or salad bar  at your store. Stock your pantry so you always have certain foods on hand, such as olive oil, canned tuna, canned tomatoes, rice, pasta, and beans. Cooking  Cook foods with extra-virgin olive oil instead of using butter or other vegetable oils. Have meat as a side dish, and have vegetables or grains as your main dish. This means having meat in small portions or adding small amounts of meat to foods like pasta or stew. Use beans or vegetables instead of meat in common dishes like chili or lasagna. Experiment with different cooking methods. Try roasting or broiling vegetables instead of steaming or sauteing them. Add frozen vegetables to soups, stews, pasta, or rice. Add nuts or seeds for added healthy fat at each meal. You can add these to yogurt, salads, or vegetable dishes. Marinate fish or vegetables using olive oil, lemon juice, garlic, and fresh herbs. Meal planning  Plan to eat 1 vegetarian meal one day each week. Try to work up to 2 vegetarian meals, if possible. Eat seafood 2 or more times a week. Have healthy snacks readily available, such as: Vegetable sticks with hummus. Greek yogurt. Fruit and nut trail mix. Eat balanced meals throughout the week. This includes: Fruit: 2-3 servings a day Vegetables: 4-5 servings a day Low-fat dairy: 2 servings a day Fish, poultry, or lean meat: 1 serving a day Beans and legumes: 2 or more servings a week Nuts and seeds: 1-2 servings a day Whole  grains: 6-8 servings a day Extra-virgin olive oil: 3-4 servings a day Limit red meat and sweets to only a few servings a month What are my food choices? Mediterranean diet Recommended Grains: Whole-grain pasta. Brown rice. Bulgar wheat. Polenta. Couscous. Whole-wheat bread. [place] Madeira. Vegetables: Artichokes. Beets. Broccoli. Cabbage. Carrots. Eggplant. Green beans. Chard. Kale. Spinach. Onions. Leeks. Peas. Squash. Tomatoes. Peppers. Radishes. Fruits: Apples. Apricots. Avocado. Berries.  Bananas. Cherries. Dates. Figs. Grapes. Lemons. Melon. Oranges. Peaches. Plums. Pomegranate. Meats and other protein foods: Beans. Almonds. Sunflower seeds. Pine nuts. Peanuts. Cod. Salmon. Scallops. Shrimp. Tuna. Tilapia. Clams. Oysters. Eggs. Dairy: Low-fat milk. Cheese. Greek yogurt. Beverages: Water. Red wine. Herbal tea. Fats and oils: Extra virgin olive oil. Avocado oil. Grape seed oil. Sweets and desserts: Greek yogurt with honey. Baked apples. Poached pears. Trail mix. Seasoning and other foods: Basil. Cilantro. Coriander. Cumin. Mint. Parsley. Sage. Rosemary. Tarragon. Garlic. Oregano. Thyme. Pepper. Balsalmic vinegar. Tahini. Hummus. Tomato sauce. Olives. Mushrooms. Limit these Grains: Prepackaged pasta or rice dishes. Prepackaged cereal with added sugar. Vegetables: Deep fried potatoes (french fries). Fruits: Fruit canned in syrup. Meats and other protein foods: Beef. Pork. Lamb. Poultry with skin. Hot dogs. [place]. Dairy: Ice cream. Sour cream. Whole milk. Beverages: Juice. Sugar-sweetened soft drinks. Beer. Liquor and spirits. Fats and oils: Butter. Canola oil. Vegetable oil. Beef fat (tallow). Lard. Sweets and desserts: Cookies. Cakes. Pies. Candy. Seasoning and other foods: Mayonnaise. Premade sauces and marinades. The items listed may not be a complete list. Talk with your dietitian about what dietary choices are right for you. Summary The Mediterranean diet includes both food and lifestyle choices. Eat a variety of fresh fruits and vegetables, beans, nuts, seeds, and whole grains. Limit the amount of red meat and sweets that you eat. Talk with your health care provider about whether it is safe for you to drink red wine in moderation. This means 1 glass a day for nonpregnant women and 2 glasses a day for men. A glass of wine equals 5 oz (150 mL). This information is not intended to replace advice given to you by your health care provider. Make sure you discuss any questions you  have with your health care provider. Document Released: 01/14/2016 Document Revised: 02/16/2016 Document Reviewed: 01/14/2016 Elsevier Interactive Patient Education  2017 [employer].   [address] [facility] [phone]

## 2024-04-09 ENCOUNTER — Other Ambulatory Visit: Payer: Self-pay | Admitting: Physician Assistant

## 2024-05-15 ENCOUNTER — Other Ambulatory Visit: Payer: Self-pay | Admitting: Physician Assistant

## 2024-05-16 DIAGNOSIS — R7309 Other abnormal glucose: Secondary | ICD-10-CM | POA: Diagnosis not present

## 2024-05-16 DIAGNOSIS — F0280 Dementia in other diseases classified elsewhere without behavioral disturbance: Secondary | ICD-10-CM | POA: Diagnosis not present

## 2024-05-16 DIAGNOSIS — I2510 Atherosclerotic heart disease of native coronary artery without angina pectoris: Secondary | ICD-10-CM | POA: Diagnosis not present

## 2024-05-16 DIAGNOSIS — E782 Mixed hyperlipidemia: Secondary | ICD-10-CM | POA: Diagnosis not present

## 2024-05-16 DIAGNOSIS — R600 Localized edema: Secondary | ICD-10-CM | POA: Diagnosis not present

## 2024-05-16 DIAGNOSIS — I129 Hypertensive chronic kidney disease with stage 1 through stage 4 chronic kidney disease, or unspecified chronic kidney disease: Secondary | ICD-10-CM | POA: Diagnosis not present

## 2024-05-16 DIAGNOSIS — G301 Alzheimer's disease with late onset: Secondary | ICD-10-CM | POA: Diagnosis not present

## 2024-05-16 DIAGNOSIS — N1831 Chronic kidney disease, stage 3a: Secondary | ICD-10-CM | POA: Diagnosis not present

## 2024-05-16 DIAGNOSIS — E039 Hypothyroidism, unspecified: Secondary | ICD-10-CM | POA: Diagnosis not present

## 2024-07-07 DEATH — deceased
